# Patient Record
Sex: Female | Born: 1970 | Race: White | Hispanic: No
Health system: Southern US, Academic
[De-identification: ages and names within clinical notes are randomized; demographics above are authoritative.]

## PROBLEM LIST (undated history)

## (undated) ENCOUNTER — Encounter

## (undated) ENCOUNTER — Ambulatory Visit: Payer: BLUE CROSS/BLUE SHIELD | Attending: Obesity Medicine | Primary: Obesity Medicine

## (undated) ENCOUNTER — Encounter
Attending: Student in an Organized Health Care Education/Training Program | Primary: Student in an Organized Health Care Education/Training Program

## (undated) ENCOUNTER — Encounter: Attending: Family | Primary: Family

## (undated) ENCOUNTER — Ambulatory Visit: Payer: BLUE CROSS/BLUE SHIELD

## (undated) ENCOUNTER — Ambulatory Visit: Payer: BLUE CROSS/BLUE SHIELD | Attending: Ambulatory Care | Primary: Ambulatory Care

## (undated) ENCOUNTER — Ambulatory Visit
Payer: BLUE CROSS/BLUE SHIELD | Attending: Physical Medicine & Rehabilitation | Primary: Physical Medicine & Rehabilitation

## (undated) ENCOUNTER — Ambulatory Visit

## (undated) ENCOUNTER — Ambulatory Visit: Payer: Medicaid (Managed Care)

## (undated) ENCOUNTER — Telehealth

## (undated) ENCOUNTER — Ambulatory Visit
Payer: Medicaid (Managed Care) | Attending: Physical Medicine & Rehabilitation | Primary: Physical Medicine & Rehabilitation

## (undated) ENCOUNTER — Ambulatory Visit
Payer: BLUE CROSS/BLUE SHIELD | Attending: Student in an Organized Health Care Education/Training Program | Primary: Student in an Organized Health Care Education/Training Program

## (undated) ENCOUNTER — Ambulatory Visit: Payer: Medicaid (Managed Care) | Attending: Otolaryngology | Primary: Otolaryngology

## (undated) ENCOUNTER — Encounter: Attending: Obesity Medicine | Primary: Obesity Medicine

## (undated) ENCOUNTER — Ambulatory Visit: Payer: BLUE CROSS/BLUE SHIELD | Attending: Physician Assistant | Primary: Physician Assistant

## (undated) ENCOUNTER — Ambulatory Visit: Payer: BLUE CROSS/BLUE SHIELD | Attending: Family | Primary: Family

## (undated) ENCOUNTER — Ambulatory Visit
Payer: Medicaid (Managed Care) | Attending: Student in an Organized Health Care Education/Training Program | Primary: Student in an Organized Health Care Education/Training Program

## (undated) ENCOUNTER — Telehealth: Attending: Nephrology | Primary: Nephrology

## (undated) ENCOUNTER — Ambulatory Visit: Payer: Medicaid (Managed Care) | Attending: Family | Primary: Family

## (undated) ENCOUNTER — Encounter: Attending: Family Medicine | Primary: Family Medicine

## (undated) ENCOUNTER — Encounter: Payer: BLUE CROSS/BLUE SHIELD | Attending: Family | Primary: Family

## (undated) ENCOUNTER — Encounter: Attending: Physician Assistant | Primary: Physician Assistant

## (undated) ENCOUNTER — Ambulatory Visit: Attending: Family | Primary: Family

## (undated) ENCOUNTER — Encounter: Attending: Physical Medicine & Rehabilitation | Primary: Physical Medicine & Rehabilitation

## (undated) ENCOUNTER — Encounter: Payer: BLUE CROSS/BLUE SHIELD | Attending: Nurse Practitioner | Primary: Nurse Practitioner

## (undated) ENCOUNTER — Telehealth: Attending: Family | Primary: Family

## (undated) ENCOUNTER — Encounter: Payer: BLUE CROSS/BLUE SHIELD | Attending: Obesity Medicine | Primary: Obesity Medicine

## (undated) ENCOUNTER — Telehealth: Attending: Foot and Ankle Surgery | Primary: Foot and Ankle Surgery

## (undated) ENCOUNTER — Encounter: Payer: BLUE CROSS/BLUE SHIELD | Attending: Foot and Ankle Surgery | Primary: Foot and Ankle Surgery

## (undated) ENCOUNTER — Encounter: Attending: Registered" | Primary: Registered"

## (undated) ENCOUNTER — Encounter: Attending: Ambulatory Care | Primary: Ambulatory Care

## (undated) ENCOUNTER — Encounter: Attending: Foot and Ankle Surgery | Primary: Foot and Ankle Surgery

## (undated) ENCOUNTER — Ambulatory Visit: Payer: BLUE CROSS/BLUE SHIELD | Attending: Registered" | Primary: Registered"

## (undated) ENCOUNTER — Ambulatory Visit: Payer: BLUE CROSS/BLUE SHIELD | Attending: Audiologist | Primary: Audiologist

## (undated) ENCOUNTER — Ambulatory Visit: Payer: BLUE CROSS/BLUE SHIELD | Attending: Surgery | Primary: Surgery

## (undated) ENCOUNTER — Ambulatory Visit: Payer: MEDICAID

## (undated) ENCOUNTER — Ambulatory Visit: Attending: Foot and Ankle Surgery | Primary: Foot and Ankle Surgery

## (undated) ENCOUNTER — Encounter: Payer: BLUE CROSS/BLUE SHIELD | Attending: Surgery | Primary: Surgery

## (undated) ENCOUNTER — Ambulatory Visit: Attending: Obesity Medicine | Primary: Obesity Medicine

## (undated) ENCOUNTER — Ambulatory Visit: Payer: BLUE CROSS/BLUE SHIELD | Attending: Foot and Ankle Surgery | Primary: Foot and Ankle Surgery

## (undated) ENCOUNTER — Telehealth: Payer: BLUE CROSS/BLUE SHIELD

## (undated) ENCOUNTER — Telehealth: Attending: Physical Medicine & Rehabilitation | Primary: Physical Medicine & Rehabilitation

## (undated) DIAGNOSIS — I10 Essential (primary) hypertension: Secondary | ICD-10-CM

## (undated) DIAGNOSIS — M549 Dorsalgia, unspecified: Secondary | ICD-10-CM

## (undated) DIAGNOSIS — E785 Hyperlipidemia, unspecified: Secondary | ICD-10-CM

## (undated) DIAGNOSIS — J45909 Unspecified asthma, uncomplicated: Secondary | ICD-10-CM

## (undated) DIAGNOSIS — E039 Hypothyroidism, unspecified: Secondary | ICD-10-CM

## (undated) DIAGNOSIS — Z87442 Personal history of urinary calculi: Secondary | ICD-10-CM

## (undated) DIAGNOSIS — R7303 Prediabetes: Secondary | ICD-10-CM

## (undated) HISTORY — PX: BACK SURGERY: SHX140

---
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
## 2004-01-21 ENCOUNTER — Ambulatory Visit: Payer: Self-pay | Admitting: Pain Medicine

## 2004-02-11 ENCOUNTER — Ambulatory Visit: Payer: Self-pay | Admitting: Pain Medicine

## 2004-03-19 ENCOUNTER — Ambulatory Visit: Payer: Self-pay | Admitting: Pain Medicine

## 2004-04-16 ENCOUNTER — Ambulatory Visit: Payer: Self-pay | Admitting: Pain Medicine

## 2004-05-12 ENCOUNTER — Ambulatory Visit: Payer: Self-pay | Admitting: Pain Medicine

## 2004-06-09 ENCOUNTER — Ambulatory Visit: Payer: Self-pay | Admitting: Pain Medicine

## 2004-07-07 ENCOUNTER — Ambulatory Visit: Payer: Self-pay | Admitting: Pain Medicine

## 2004-08-06 ENCOUNTER — Ambulatory Visit: Payer: Self-pay | Admitting: Pain Medicine

## 2004-09-03 ENCOUNTER — Ambulatory Visit: Payer: Self-pay | Admitting: Pain Medicine

## 2004-09-03 ENCOUNTER — Emergency Department: Payer: Self-pay | Admitting: Emergency Medicine

## 2004-09-03 ENCOUNTER — Other Ambulatory Visit: Payer: Self-pay

## 2004-10-06 ENCOUNTER — Ambulatory Visit: Payer: Self-pay | Admitting: Pain Medicine

## 2004-10-16 ENCOUNTER — Emergency Department: Payer: Self-pay | Admitting: Emergency Medicine

## 2004-11-03 ENCOUNTER — Ambulatory Visit: Payer: Self-pay | Admitting: Pain Medicine

## 2004-12-02 ENCOUNTER — Ambulatory Visit: Payer: Self-pay | Admitting: Pain Medicine

## 2005-10-30 ENCOUNTER — Emergency Department: Payer: Self-pay | Admitting: Emergency Medicine

## 2005-11-08 ENCOUNTER — Emergency Department (HOSPITAL_COMMUNITY): Admission: EM | Admit: 2005-11-08 | Discharge: 2005-11-08 | Payer: Self-pay | Admitting: Emergency Medicine

## 2007-02-28 ENCOUNTER — Encounter: Admission: RE | Admit: 2007-02-28 | Discharge: 2007-02-28 | Payer: Self-pay | Admitting: Orthopaedic Surgery

## 2008-02-09 ENCOUNTER — Encounter: Admission: RE | Admit: 2008-02-09 | Discharge: 2008-02-09 | Payer: Self-pay | Admitting: Orthopedic Surgery

## 2009-07-04 ENCOUNTER — Emergency Department: Payer: Self-pay | Admitting: Unknown Physician Specialty

## 2012-03-07 ENCOUNTER — Emergency Department: Payer: Self-pay | Admitting: Emergency Medicine

## 2012-03-07 LAB — CBC
MCH: 30.9 pg (ref 26.0–34.0)
Platelet: 328 10*3/uL (ref 150–440)
RBC: 4.88 10*6/uL (ref 3.80–5.20)
WBC: 10.8 10*3/uL (ref 3.6–11.0)

## 2012-03-07 LAB — CK TOTAL AND CKMB (NOT AT ARMC): CK, Total: 67 U/L (ref 21–215)

## 2012-03-07 LAB — COMPREHENSIVE METABOLIC PANEL
Albumin: 3.9 g/dL (ref 3.4–5.0)
Alkaline Phosphatase: 71 U/L (ref 50–136)
BUN: 12 mg/dL (ref 7–18)
Bilirubin,Total: 0.9 mg/dL (ref 0.2–1.0)
Creatinine: 0.7 mg/dL (ref 0.60–1.30)
Osmolality: 272 (ref 275–301)
SGPT (ALT): 29 U/L (ref 12–78)
Sodium: 136 mmol/L (ref 136–145)
Total Protein: 7.5 g/dL (ref 6.4–8.2)

## 2012-03-07 LAB — TSH: Thyroid Stimulating Horm: 6.23 u[IU]/mL — ABNORMAL HIGH

## 2012-06-28 ENCOUNTER — Ambulatory Visit: Payer: Self-pay | Admitting: Internal Medicine

## 2013-09-14 DIAGNOSIS — M549 Dorsalgia, unspecified: Secondary | ICD-10-CM | POA: Insufficient documentation

## 2013-11-13 DIAGNOSIS — R946 Abnormal results of thyroid function studies: Secondary | ICD-10-CM | POA: Insufficient documentation

## 2014-01-29 ENCOUNTER — Other Ambulatory Visit: Payer: Self-pay | Admitting: Orthopaedic Surgery

## 2014-01-29 DIAGNOSIS — M549 Dorsalgia, unspecified: Principal | ICD-10-CM

## 2014-01-29 DIAGNOSIS — G8929 Other chronic pain: Secondary | ICD-10-CM

## 2014-02-01 ENCOUNTER — Other Ambulatory Visit: Payer: Self-pay

## 2014-02-06 ENCOUNTER — Ambulatory Visit
Admission: RE | Admit: 2014-02-06 | Discharge: 2014-02-06 | Disposition: A | Discharge status: Home / Self Care | Payer: Medicaid Other | Source: Ambulatory Visit | Attending: Orthopaedic Surgery | Admitting: Orthopaedic Surgery

## 2014-02-06 DIAGNOSIS — G8929 Other chronic pain: Secondary | ICD-10-CM

## 2014-02-06 DIAGNOSIS — M549 Dorsalgia, unspecified: Principal | ICD-10-CM

## 2014-03-22 DIAGNOSIS — I2699 Other pulmonary embolism without acute cor pulmonale: Secondary | ICD-10-CM

## 2014-03-22 HISTORY — DX: Other pulmonary embolism without acute cor pulmonale: I26.99

## 2014-03-22 HISTORY — PX: ABDOMINAL HYSTERECTOMY: SHX81

## 2014-04-04 DIAGNOSIS — E039 Hypothyroidism, unspecified: Secondary | ICD-10-CM | POA: Insufficient documentation

## 2014-05-28 DIAGNOSIS — M1990 Unspecified osteoarthritis, unspecified site: Secondary | ICD-10-CM | POA: Insufficient documentation

## 2014-05-28 DIAGNOSIS — T7840XA Allergy, unspecified, initial encounter: Secondary | ICD-10-CM | POA: Insufficient documentation

## 2014-05-28 DIAGNOSIS — J4520 Mild intermittent asthma, uncomplicated: Secondary | ICD-10-CM | POA: Insufficient documentation

## 2017-01-10 ENCOUNTER — Ambulatory Visit
Admission: RE | Admit: 2017-01-10 | Discharge: 2017-01-10 | Disposition: A | Discharge status: Home / Self Care | Payer: MEDICAID

## 2017-01-10 DIAGNOSIS — Z1231 Encounter for screening mammogram for malignant neoplasm of breast: Principal | ICD-10-CM

## 2017-04-16 ENCOUNTER — Other Ambulatory Visit: Payer: Self-pay | Admitting: Oncology

## 2017-04-16 DIAGNOSIS — M961 Postlaminectomy syndrome, not elsewhere classified: Secondary | ICD-10-CM

## 2017-04-30 ENCOUNTER — Other Ambulatory Visit: Payer: Self-pay

## 2017-05-17 ENCOUNTER — Ambulatory Visit
Admission: RE | Admit: 2017-05-17 | Discharge: 2017-05-17 | Disposition: A | Discharge status: Home / Self Care | Payer: Medicaid Other | Source: Ambulatory Visit | Attending: Oncology | Admitting: Oncology

## 2017-05-17 DIAGNOSIS — M961 Postlaminectomy syndrome, not elsewhere classified: Secondary | ICD-10-CM

## 2017-05-17 MED ORDER — GADOBENATE DIMEGLUMINE 529 MG/ML IV SOLN
20.0000 mL | Freq: Once | INTRAVENOUS | Status: AC | PRN
  Administered 2017-05-17: 20 mL via INTRAVENOUS

## 2017-05-18 ENCOUNTER — Other Ambulatory Visit: Payer: Medicaid Other

## 2017-05-29 ENCOUNTER — Encounter
Admit: 2017-05-29 | Discharge: 2017-05-29 | Disposition: A | Discharge status: Home / Self Care | Payer: BLUE CROSS/BLUE SHIELD | Attending: Emergency Medicine

## 2017-05-29 DIAGNOSIS — M25511 Pain in right shoulder: Principal | ICD-10-CM

## 2017-05-29 MED ORDER — KETOROLAC 10 MG TABLET
ORAL_TABLET | Freq: Four times a day (QID) | ORAL | 0 refills | 0 days | Status: CP | PRN
Start: 2017-05-29 — End: 2017-06-01

## 2017-06-29 ENCOUNTER — Encounter: Admit: 2017-06-29 | Discharge: 2017-06-30 | Payer: BLUE CROSS/BLUE SHIELD

## 2017-06-29 DIAGNOSIS — M25562 Pain in left knee: Principal | ICD-10-CM

## 2017-11-07 ENCOUNTER — Emergency Department: Payer: Self-pay

## 2017-11-07 ENCOUNTER — Other Ambulatory Visit: Payer: Self-pay

## 2017-11-07 ENCOUNTER — Encounter: Payer: Self-pay | Admitting: *Deleted

## 2017-11-07 ENCOUNTER — Emergency Department
Admission: EM | Admit: 2017-11-07 | Discharge: 2017-11-07 | Disposition: A | Discharge status: Home / Self Care | Payer: Self-pay | Attending: Emergency Medicine | Admitting: Emergency Medicine

## 2017-11-07 DIAGNOSIS — Y9389 Activity, other specified: Secondary | ICD-10-CM | POA: Insufficient documentation

## 2017-11-07 DIAGNOSIS — Y998 Other external cause status: Secondary | ICD-10-CM | POA: Insufficient documentation

## 2017-11-07 DIAGNOSIS — W010XXA Fall on same level from slipping, tripping and stumbling without subsequent striking against object, initial encounter: Secondary | ICD-10-CM | POA: Insufficient documentation

## 2017-11-07 DIAGNOSIS — S9304XA Dislocation of right ankle joint, initial encounter: Secondary | ICD-10-CM | POA: Insufficient documentation

## 2017-11-07 DIAGNOSIS — Y92019 Unspecified place in single-family (private) house as the place of occurrence of the external cause: Secondary | ICD-10-CM | POA: Insufficient documentation

## 2017-11-07 DIAGNOSIS — S9306XA Dislocation of unspecified ankle joint, initial encounter: Secondary | ICD-10-CM

## 2017-11-07 HISTORY — DX: Dislocation of unspecified ankle joint, initial encounter: S93.06XA

## 2017-11-07 HISTORY — DX: Dorsalgia, unspecified: M54.9

## 2017-11-07 MED ORDER — BUPIVACAINE HCL 0.5 % IJ SOLN
10.0000 mL | Freq: Once | INTRAMUSCULAR | Status: AC
  Administered 2017-11-07: 10 mL
  Filled 2017-11-07 (×2): qty 10

## 2017-11-07 MED ORDER — LIDOCAINE HCL (PF) 1 % IJ SOLN
10.0000 mL | Freq: Once | INTRAMUSCULAR | Status: AC
Start: 2017-11-07 — End: 2017-11-07
  Administered 2017-11-07: 10 mL
  Filled 2017-11-07: qty 10

## 2017-11-07 MED ORDER — HYDROMORPHONE HCL 1 MG/ML IJ SOLN
1.0000 mg | Freq: Once | INTRAMUSCULAR | Status: AC
  Administered 2017-11-07: 1 mg via INTRAVENOUS
  Filled 2017-11-07: qty 1

## 2017-11-07 MED ORDER — ONDANSETRON 8 MG PO TBDP
ORAL_TABLET | ORAL | Status: AC
  Filled 2017-11-07: qty 1

## 2017-11-07 MED ORDER — OXYCODONE-ACETAMINOPHEN 5-325 MG PO TABS: 1.0000 | ORAL_TABLET | Freq: Four times a day (QID) | ORAL | 0 refills | Status: DC | PRN

## 2017-11-07 MED ORDER — ONDANSETRON HCL 4 MG/2ML IJ SOLN
4.0000 mg | Freq: Once | INTRAMUSCULAR | Status: AC
  Administered 2017-11-07: 4 mg via INTRAVENOUS
  Filled 2017-11-07: qty 2

## 2017-11-07 MED ORDER — SODIUM CHLORIDE 0.9 % IV BOLUS
1000.0000 mL | Freq: Once | INTRAVENOUS | Status: AC
  Administered 2017-11-07: 1000 mL via INTRAVENOUS

## 2017-11-07 MED ORDER — BUPIVACAINE HCL 0.5 % IJ SOLN: 50.0000 mL | Freq: Once | INTRAMUSCULAR | Status: DC

## 2017-11-07 MED ORDER — ONDANSETRON 8 MG PO TBDP
8.0000 mg | ORAL_TABLET | Freq: Once | ORAL | Status: AC
  Administered 2017-11-07: 8 mg via ORAL
  Filled 2017-11-07: qty 1

## 2017-11-07 NOTE — ED Notes (Signed)
Patient assisted with bedpan.

## 2017-11-07 NOTE — ED Provider Notes (Signed)
[facility] Emergency Department Provider Note  ____________________________________________  Time seen: Approximately 7:17 PM  I have reviewed the triage vital signs and the nursing notes.   HISTORY  Chief Complaint Ankle Injury    HPI Katie Ryan is a 47 y.o. female who presents the emergency department via EMS for right ankle injury.  Patient was attempting to clean her house using bed she is when she slipped on a pro-water.  Patient reports that her ankle twisted and was visibly deformed after injury.  Patient reports that she drug herself to her phone to call 911.  She is unable to move or bear weight to the right extremity.  EMS reported deformity on scene.  They splinted in same and marked pulses using indelible marker.  Sensation intact reportedly from EMS.  IV established with 100 mcg of fentanyl administered in route.  Visualization of the ankle does reveal mild deformity to the medial ankle.  Otherwise, no gross deformity.  Significant edema and mild ecchymosis is also appreciated.   Patient denies any other injuries or complaints at this time.  No history of previous ankle injury or surgery.   Past Medical History:  Diagnosis Date  . Back pain     There are no active problems to display for this patient.   Past Surgical History:  Procedure Laterality Date  . BACK SURGERY      Prior to Admission medications   Medication Sig Start Date End Date Taking? Authorizing Provider  oxyCODONE-acetaminophen (PERCOCET/ROXICET) 5-325 MG tablet Take 1 tablet by mouth every 6 (six) hours as needed for severe pain. 11/07/17   Cuthriell, Charline Bills, PA-C    Allergies Iohexol; Shellfish allergy; Sulfa antibiotics; and Topamax [topiramate]  History reviewed. No pertinent family history.  Social History Social History   Tobacco Use  . Smoking status: Never Smoker  . Smokeless tobacco: Never Used  Substance Use Topics  . Alcohol use: Never     Frequency: Never  . Drug use: Never     Review of Systems  Constitutional: No fever/chills Eyes: No visual changes. No discharge ENT: No upper respiratory complaints. Cardiovascular: no chest pain. Respiratory: no cough. No SOB. Gastrointestinal: No abdominal pain.  No nausea, no vomiting.   Musculoskeletal: Positive for right ankle injury Skin: Negative for rash, abrasions, lacerations, ecchymosis. Neurological: Negative for headaches, focal weakness or numbness. 10-point ROS otherwise negative.  ____________________________________________   PHYSICAL EXAM:  VITAL SIGNS: ED Triage Vitals [11/07/17 1916]  Enc Vitals Group     BP (!) 170/85     Pulse Rate 78     Resp 18     Temp 98.3 F (36.8 C)     Temp Source Oral     SpO2 97 %     Weight      Height      Head Circumference      Peak Flow      Pain Score      Pain Loc      Pain Edu?      Excl. in [place]?      Constitutional: Alert and oriented. Well appearing and in no acute distress. Eyes: Conjunctivae are normal. PERRL. EOMI. Head: Atraumatic. Neck: No stridor.    Cardiovascular: Normal rate, regular rhythm. Normal S1 and S2.  Good peripheral circulation. Respiratory: Normal respiratory effort without tachypnea or retractions. Lungs CTAB. Good air entry to the bases with no decreased or absent breath sounds. Musculoskeletal: Full range of motion to all extremities.  Right ankle in splint provided by EMS.. Visualization of the ankle does reveal mild deformity to the medial ankle.  Otherwise, no gross deformity.  Significant edema and mild ecchymosis is also appreciated.   Indelible marker to foot over dorsalis pedis pulse which is still intact.  Sensation intact all 5 digits.  Patient is extremely tender to palpation along the ankle joint globally.  No significant tenderness to palpation over the tarsal bones. Neurologic:  Normal speech and language. No gross focal neurologic deficits are appreciated.  Skin:  Skin is  warm, dry and intact. No rash noted. Psychiatric: Mood and affect are normal. Speech and behavior are normal. Patient exhibits appropriate insight and judgement.   ____________________________________________   LABS (all labs ordered are listed, but only abnormal results are displayed)  Labs Reviewed - No data to display ____________________________________________  EKG   ____________________________________________  RADIOLOGY I personally viewed and evaluated these images as part of my medical decision making, as well as reviewing the written report by the radiologist.  Dg Ankle 2 Views Right  Result Date: 11/07/2017 CLINICAL DATA:  Post reduction and splinting EXAM: RIGHT ANKLE - 2 VIEW COMPARISON:  Earlier exam of 11/07/2017 FINDINGS: Fiberglass splint material obscures bone detail. Decreased lateral subluxation of the talus at ankle joint versus prior study. No definite fracture or dislocation. Osseous mineralization normal. IMPRESSION: Decreased lateral subluxation of talus at ankle joint versus earlier study. Electronically Signed   By: Lavonia Dana M.D.   On: 11/07/2017 21:08   Dg Ankle Complete Right  Result Date: 11/07/2017 CLINICAL DATA:  RIGHT ankle injury, ankle deformity and bruising. EXAM: RIGHT ANKLE - COMPLETE 3+ VIEW COMPARISON:  None. FINDINGS: Prominent medial dislocation of the distal RIGHT tibia relative to the underlying talus, with associated widening of the medial ankle mortise. No convincing fracture line or displaced fracture fragment identified within the distal tibia, distal fibula or talus. Visualized structures of the hindfoot and midfoot appear intact and normally aligned. Probable soft tissue swelling/edema medial to the RIGHT ankle. IMPRESSION: 1. Prominent medial displacement of the distal tibia relative to the underlying talus. Probable additional medial displacement of the distal fibula with slight overlapping of the lateral malleolus and lateral talar  dome. 2. No convincing fracture line or displaced fracture fragment identified. 3. Soft tissue swelling/edema Electronically Signed   By: Franki Cabot M.D.   On: 11/07/2017 19:36    ____________________________________________    PROCEDURES  Procedure(s) performed:    Reduction of dislocation Date/Time: 11/07/2017 9:16 PM Performed by: Darletta Moll, PA-C Authorized by: Darletta Moll, PA-C  Consent: Verbal consent obtained. Risks and benefits: risks, benefits and alternatives were discussed Consent given by: patient Required items: required blood products, implants, devices, and special equipment available Patient identity confirmed: verbally with patient Time out: Immediately prior to procedure a "time out" was called to verify the correct patient, procedure, equipment, support staff and site/side marked as required. Local anesthesia used: yes Anesthesia: nerve block  Anesthesia: Local anesthesia used: yes Local Anesthetic: bupivacaine 0.5% with epinephrine and lidocaine 1% without epinephrine Anesthetic total: 15 mL  Sedation: Patient sedated: no  Patient tolerance: Patient tolerated the procedure well with no immediate complications Comments: Manual manipulation of the distal tibia along with significant dorsiflexion is applied.  Palpation reveals improvement and dislocation.  Patient is splinted.  Archie Endo Block Date/Time: 11/07/2017 9:17 PM Performed by: Darletta Moll, PA-C Authorized by: Darletta Moll, PA-C   Consent:    Consent obtained:  Verbal  Consent given by:  Patient   Risks discussed:  Swelling, unsuccessful block and pain Indications:    Indications:  Pain relief and procedural anesthesia Location:    Body area:  Lower extremity   Lower extremity nerve:  Saphenous (and sural)   Laterality:  Right Pre-procedure details:    Skin preparation:  Alcohol   Preparation: Patient was prepped and draped in usual sterile fashion    Procedure details (see MAR for exact dosages):    Block needle gauge:  25 G   Anesthetic injected:  Bupivacaine 0.5% w/o epi and lidocaine 1% w/o epi   Additive injected:  None   Injection procedure:  Anatomic landmarks identified, anatomic landmarks palpated, introduced needle, incremental injection and negative aspiration for blood Post-procedure details:    Dressing:  None   Outcome:  Anesthesia achieved   Patient tolerance of procedure:  Tolerated well, no immediate complications .Splint Application Date/Time: 09/27/6281 9:19 PM Performed by: Darletta Moll, PA-C Authorized by: Darletta Moll, PA-C   Consent:    Consent obtained:  Verbal   Consent given by:  Patient   Risks discussed:  Pain and swelling Pre-procedure details:    Sensation:  Normal Procedure details:    Laterality:  Right   Location:  Ankle   Ankle:  R ankle   Splint type:  Short leg and ankle stirrup   Supplies:  Cotton padding, Ortho-Glass and elastic bandage Post-procedure details:    Pain:  Unchanged   Sensation:  Normal   Patient tolerance of procedure:  Tolerated well, no immediate complications      Medications  bupivacaine (MARCAINE) 0.5 % (with pres) injection 10 mL (has no administration in time range)  sodium chloride 0.9 % bolus 1,000 mL (1,000 mLs Intravenous New Bag/Given 11/07/17 1936)  HYDROmorphone (DILAUDID) injection 1 mg (1 mg Intravenous Given 11/07/17 1936)  ondansetron (ZOFRAN) injection 4 mg (4 mg Intravenous Given 11/07/17 1936)  lidocaine (PF) (XYLOCAINE) 1 % injection 10 mL (10 mLs Infiltration Given 11/07/17 2008)  HYDROmorphone (DILAUDID) injection 1 mg (1 mg Intravenous Given 11/07/17 2103)     ____________________________________________   INITIAL IMPRESSION / ASSESSMENT AND PLAN / ED COURSE  Pertinent labs & imaging results that were available during my care of the patient were reviewed by me and considered in my medical decision making (see chart for  details).  Review of the [place] CSRS was performed in accordance of the [place] prior to dispensing any controlled drugs.      Patient's diagnosis is consistent with ankle dislocation.  Patient presents the emergency department complaining of right ankle injury and pain.  Patient slipped at her home causing deformity to her ankle.  On exam, exam is concerning for dislocation versus fracture.  X-ray reveals dislocation with no visible fracture.  Reduction and splinting were performed in the emergency department.  Patient was neurovascularly intact both status post and prior to procedure.  Patient is to ambulate with crutches.  Follow-up with orthopedics for further management.. Patient will be discharged home with prescriptions for Percocet. Patient is to follow up with orthopedics as needed or otherwise directed. Patient is given ED precautions to return to the ED for any worsening or new symptoms.     ____________________________________________  FINAL CLINICAL IMPRESSION(S) / ED DIAGNOSES  Final diagnoses:  Closed dislocation of ankle, right, initial encounter      NEW MEDICATIONS STARTED DURING THIS VISIT:  ED Discharge Orders         Ordered  oxyCODONE-acetaminophen (PERCOCET/ROXICET) 5-325 MG tablet  Every 6 hours PRN     11/07/17 2115              This chart was dictated using voice recognition software/Dragon. Despite best efforts to proofread, errors can occur which can change the meaning. Any change was purely unintentional.    Darletta Moll, PA-C 11/07/17 2121    Nance Pear, MD 11/07/17 2306

## 2017-11-07 NOTE — ED Triage Notes (Signed)
Fentanyl administered en route per EMS 200 mcg total last given 1857. Pt slipped on wet floor, injuring R ankle. Ankle is deformed and bruised. Strong smell of marijuana from pt.

## 2017-11-09 ENCOUNTER — Ambulatory Visit: Admit: 2017-11-09 | Discharge: 2017-11-10 | Payer: BLUE CROSS/BLUE SHIELD

## 2017-11-09 ENCOUNTER — Encounter: Admit: 2017-11-09 | Discharge: 2017-11-10 | Payer: BLUE CROSS/BLUE SHIELD

## 2017-11-09 DIAGNOSIS — S82451A Displaced comminuted fracture of shaft of right fibula, initial encounter for closed fracture: Principal | ICD-10-CM

## 2017-11-09 DIAGNOSIS — S82861A Displaced Maisonneuve's fracture of right leg, initial encounter for closed fracture: Principal | ICD-10-CM

## 2017-11-09 DIAGNOSIS — S82899A Other fracture of unspecified lower leg, initial encounter for closed fracture: Principal | ICD-10-CM

## 2017-11-09 DIAGNOSIS — S8991XA Unspecified injury of right lower leg, initial encounter: Principal | ICD-10-CM

## 2017-11-09 MED ORDER — OXYCODONE-ACETAMINOPHEN 5 MG-325 MG TABLET
ORAL_TABLET | ORAL | 0 refills | 0.00000 days | Status: CP | PRN
Start: 2017-11-09 — End: 2017-11-11

## 2017-11-10 ENCOUNTER — Encounter: Admit: 2017-11-10 | Discharge: 2017-11-11 | Payer: BLUE CROSS/BLUE SHIELD

## 2017-11-10 ENCOUNTER — Encounter: Admit: 2017-11-10 | Discharge: 2017-11-11

## 2017-11-10 ENCOUNTER — Ambulatory Visit: Admit: 2017-11-10 | Discharge: 2017-11-11

## 2017-11-10 DIAGNOSIS — S82451A Displaced comminuted fracture of shaft of right fibula, initial encounter for closed fracture: Principal | ICD-10-CM

## 2017-11-10 MED ORDER — DOCUSATE SODIUM 100 MG CAPSULE
ORAL_CAPSULE | Freq: Two times a day (BID) | ORAL | 0 refills | 0.00000 days | Status: CP
Start: 2017-11-10 — End: 2017-11-24

## 2017-11-10 MED ORDER — OXYCODONE 5 MG TABLET
ORAL_TABLET | ORAL | 0 refills | 0 days | Status: CP | PRN
Start: 2017-11-10 — End: 2017-11-15

## 2017-11-10 MED ORDER — ASCORBIC ACID (VITAMIN C) 1,000 MG TABLET
ORAL_TABLET | Freq: Every day | ORAL | 0 refills | 0.00000 days | Status: CP
Start: 2017-11-10 — End: 2017-12-22

## 2017-11-10 MED ORDER — ENOXAPARIN 40 MG/0.4 ML SUBCUTANEOUS SYRINGE
INJECTION | Freq: Two times a day (BID) | SUBCUTANEOUS | 0 refills | 0.00000 days | Status: CP
Start: 2017-11-10 — End: 2017-11-24

## 2017-11-10 MED ORDER — CALCIUM CARBONATE 200 MG CALCIUM (500 MG) CHEWABLE TABLET
ORAL_TABLET | Freq: Every day | ORAL | 0 refills | 0.00000 days | Status: CP
Start: 2017-11-10 — End: 2017-12-10

## 2017-11-10 MED ORDER — GABAPENTIN 100 MG CAPSULE
ORAL_CAPSULE | Freq: Three times a day (TID) | ORAL | 0 refills | 0 days | Status: CP
Start: 2017-11-10 — End: 2018-08-11

## 2017-11-10 MED ORDER — ACETAMINOPHEN 500 MG TABLET
ORAL_TABLET | Freq: Three times a day (TID) | ORAL | 0 refills | 0.00000 days | Status: CP
Start: 2017-11-10 — End: 2017-11-24

## 2017-11-10 MED ORDER — PROMETHAZINE 12.5 MG TABLET
ORAL_TABLET | Freq: Four times a day (QID) | ORAL | 0 refills | 0.00000 days | Status: CP | PRN
Start: 2017-11-10 — End: 2018-08-23

## 2017-11-22 ENCOUNTER — Encounter
Admit: 2017-11-22 | Discharge: 2017-11-23 | Payer: BLUE CROSS/BLUE SHIELD | Attending: Foot and Ankle Surgery | Primary: Foot and Ankle Surgery

## 2017-11-22 DIAGNOSIS — S82861S Displaced Maisonneuve's fracture of right leg, sequela: Principal | ICD-10-CM

## 2017-12-13 ENCOUNTER — Encounter: Admit: 2017-12-13 | Discharge: 2017-12-14 | Payer: BLUE CROSS/BLUE SHIELD

## 2017-12-13 ENCOUNTER — Encounter
Admit: 2017-12-13 | Discharge: 2017-12-14 | Payer: BLUE CROSS/BLUE SHIELD | Attending: Foot and Ankle Surgery | Primary: Foot and Ankle Surgery

## 2017-12-13 DIAGNOSIS — S82861S Displaced Maisonneuve's fracture of right leg, sequela: Principal | ICD-10-CM

## 2018-01-05 ENCOUNTER — Encounter
Admit: 2018-01-05 | Discharge: 2018-01-06 | Disposition: A | Discharge status: Home / Self Care | Payer: BLUE CROSS/BLUE SHIELD | Attending: Family

## 2018-01-05 MED ORDER — METRONIDAZOLE 500 MG TABLET
ORAL_TABLET | Freq: Two times a day (BID) | ORAL | 0 refills | 0 days | Status: CP
Start: 2018-01-05 — End: 2018-01-12

## 2018-01-05 MED ORDER — FLUCONAZOLE 150 MG TABLET
ORAL_TABLET | Freq: Once | ORAL | 0 refills | 0 days | Status: CP | PRN
Start: 2018-01-05 — End: 2018-08-23

## 2018-02-19 ENCOUNTER — Encounter
Admit: 2018-02-19 | Discharge: 2018-02-20 | Disposition: A | Discharge status: Home / Self Care | Payer: BLUE CROSS/BLUE SHIELD

## 2018-02-19 MED ORDER — MICONAZOLE NITRATE 2 % TOPICAL CREAM
Freq: Two times a day (BID) | TOPICAL | 0 refills | 0.00000 days | Status: CP
Start: 2018-02-19 — End: 2018-08-23

## 2018-08-01 ENCOUNTER — Telehealth
Admit: 2018-08-01 | Discharge: 2018-08-02 | Payer: BLUE CROSS/BLUE SHIELD | Attending: Foot and Ankle Surgery | Primary: Foot and Ankle Surgery

## 2018-08-01 DIAGNOSIS — S82861S Displaced Maisonneuve's fracture of right leg, sequela: Principal | ICD-10-CM

## 2018-08-02 ENCOUNTER — Ambulatory Visit: Admit: 2018-08-02 | Discharge: 2018-08-03

## 2018-08-02 DIAGNOSIS — S82861S Displaced Maisonneuve's fracture of right leg, sequela: Principal | ICD-10-CM

## 2018-08-11 ENCOUNTER — Ambulatory Visit: Admit: 2018-08-11 | Discharge: 2018-08-12 | Attending: Foot and Ankle Surgery | Primary: Foot and Ankle Surgery

## 2018-08-11 DIAGNOSIS — T8484XA Pain due to internal orthopedic prosthetic devices, implants and grafts, initial encounter: Principal | ICD-10-CM

## 2018-08-23 ENCOUNTER — Encounter: Admit: 2018-08-23 | Discharge: 2018-08-24 | Payer: BLUE CROSS/BLUE SHIELD | Attending: Family | Primary: Family

## 2018-08-23 ENCOUNTER — Telehealth: Admit: 2018-08-23 | Discharge: 2018-08-24

## 2018-08-23 DIAGNOSIS — T8484XA Pain due to internal orthopedic prosthetic devices, implants and grafts, initial encounter: Secondary | ICD-10-CM

## 2018-08-23 DIAGNOSIS — M545 Low back pain: Secondary | ICD-10-CM

## 2018-08-23 DIAGNOSIS — J4520 Mild intermittent asthma, uncomplicated: Secondary | ICD-10-CM

## 2018-08-23 DIAGNOSIS — G8929 Other chronic pain: Secondary | ICD-10-CM

## 2018-08-23 DIAGNOSIS — Z01818 Encounter for other preprocedural examination: Principal | ICD-10-CM

## 2018-08-23 DIAGNOSIS — Z6841 Body Mass Index (BMI) 40.0 and over, adult: Secondary | ICD-10-CM

## 2018-08-23 DIAGNOSIS — K219 Gastro-esophageal reflux disease without esophagitis: Secondary | ICD-10-CM

## 2018-08-28 ENCOUNTER — Ambulatory Visit: Admit: 2018-08-28 | Discharge: 2018-08-29 | Attending: Family | Primary: Family

## 2018-08-28 DIAGNOSIS — Z1159 Encounter for screening for other viral diseases: Principal | ICD-10-CM

## 2018-08-29 DIAGNOSIS — T8484XA Pain due to internal orthopedic prosthetic devices, implants and grafts, initial encounter: Principal | ICD-10-CM

## 2018-08-30 ENCOUNTER — Ambulatory Visit: Admit: 2018-08-30 | Discharge: 2018-08-30

## 2018-08-30 ENCOUNTER — Encounter
Admit: 2018-08-30 | Discharge: 2018-08-30 | Attending: Student in an Organized Health Care Education/Training Program | Primary: Student in an Organized Health Care Education/Training Program

## 2018-08-30 DIAGNOSIS — T8484XA Pain due to internal orthopedic prosthetic devices, implants and grafts, initial encounter: Principal | ICD-10-CM

## 2018-08-30 MED ORDER — ACETAMINOPHEN 500 MG TABLET
ORAL_TABLET | Freq: Three times a day (TID) | ORAL | 0 refills | 0.00000 days | Status: CP | PRN
Start: 2018-08-30 — End: 2018-09-13

## 2018-08-30 MED ORDER — PROMETHAZINE 12.5 MG TABLET
ORAL_TABLET | Freq: Four times a day (QID) | ORAL | 0 refills | 0.00000 days | Status: CP | PRN
Start: 2018-08-30 — End: ?
  Filled 2018-08-30: qty 30, 4d supply, fill #0

## 2018-08-30 MED ORDER — MULTIVITAMIN TABLET
ORAL_TABLET | Freq: Every day | ORAL | 0 refills | 0.00000 days | Status: CP
Start: 2018-08-30 — End: 2018-09-20
  Filled 2018-11-27: qty 8, 56d supply, fill #0

## 2018-08-30 MED ORDER — GABAPENTIN 100 MG CAPSULE
ORAL_CAPSULE | Freq: Three times a day (TID) | ORAL | 0 refills | 0.00000 days | Status: CP
Start: 2018-08-30 — End: 2018-09-02
  Filled 2018-11-27: qty 9, 3d supply, fill #0

## 2018-08-30 MED ORDER — DOCUSATE SODIUM 100 MG CAPSULE
ORAL_CAPSULE | Freq: Two times a day (BID) | ORAL | 0 refills | 0 days | Status: CP | PRN
Start: 2018-08-30 — End: 2018-09-13
  Filled 2018-11-27: qty 28, 14d supply, fill #0

## 2018-08-30 MED ORDER — ERGOCALCIFEROL (VITAMIN D2) 1,250 MCG (50,000 UNIT) CAPSULE
ORAL_CAPSULE | ORAL | 0 refills | 0 days | Status: CP
Start: 2018-08-30 — End: 2018-10-25

## 2018-08-30 MED ORDER — OXYCODONE 5 MG TABLET
ORAL_TABLET | Freq: Four times a day (QID) | ORAL | 0 refills | 0.00000 days | Status: CP | PRN
Start: 2018-08-30 — End: 2018-09-04
  Filled 2018-08-30: qty 15, 3d supply, fill #0

## 2018-08-30 MED ORDER — ASPIRIN 81 MG CHEWABLE TABLET
ORAL_TABLET | Freq: Every day | ORAL | 0 refills | 0.00000 days | Status: CP
Start: 2018-08-30 — End: 2018-10-11
  Filled 2018-11-27: qty 36, 36d supply, fill #0

## 2018-08-30 MED ORDER — ASCORBIC ACID (VITAMIN C) 500 MG TABLET
ORAL_TABLET | Freq: Every day | ORAL | 0 refills | 0.00000 days | Status: CP
Start: 2018-08-30 — End: 2018-09-20

## 2018-08-30 MED FILL — OXYCODONE 5 MG TABLET: 3 days supply | Qty: 15 | Fill #0 | Status: AC

## 2018-08-30 MED FILL — PROMETHAZINE 12.5 MG TABLET: 4 days supply | Qty: 30 | Fill #0 | Status: AC

## 2018-09-08 ENCOUNTER — Ambulatory Visit: Admit: 2018-09-08 | Discharge: 2018-09-09 | Attending: Family | Primary: Family

## 2018-09-08 DIAGNOSIS — M1990 Unspecified osteoarthritis, unspecified site: Secondary | ICD-10-CM

## 2018-09-08 DIAGNOSIS — E7800 Pure hypercholesterolemia, unspecified: Secondary | ICD-10-CM

## 2018-09-08 DIAGNOSIS — G8929 Other chronic pain: Secondary | ICD-10-CM

## 2018-09-08 DIAGNOSIS — J4520 Mild intermittent asthma, uncomplicated: Secondary | ICD-10-CM

## 2018-09-08 DIAGNOSIS — M545 Low back pain: Secondary | ICD-10-CM

## 2018-09-08 DIAGNOSIS — E039 Hypothyroidism, unspecified: Principal | ICD-10-CM

## 2018-09-08 DIAGNOSIS — T8484XA Pain due to internal orthopedic prosthetic devices, implants and grafts, initial encounter: Secondary | ICD-10-CM

## 2018-09-08 DIAGNOSIS — E049 Nontoxic goiter, unspecified: Secondary | ICD-10-CM

## 2018-09-08 MED ORDER — ALBUTEROL SULFATE HFA 90 MCG/ACTUATION AEROSOL INHALER
Freq: Four times a day (QID) | RESPIRATORY_TRACT | 0 refills | 0 days | Status: CP | PRN
Start: 2018-09-08 — End: 2018-11-01
  Filled 2018-09-27: qty 8.5, 25d supply, fill #0

## 2018-09-08 MED ORDER — POTASSIUM CHLORIDE ER 10 MEQ TABLET,EXTENDED RELEASE
ORAL_TABLET | Freq: Every day | ORAL | 1 refills | 90.00000 days | Status: CP
Start: 2018-09-08 — End: ?
  Filled 2018-09-27: qty 30, 30d supply, fill #0

## 2018-09-08 MED ORDER — CELECOXIB 200 MG CAPSULE
ORAL_CAPSULE | Freq: Every day | ORAL | 3 refills | 0 days | Status: CP
Start: 2018-09-08 — End: 2018-09-20

## 2018-09-08 MED ORDER — HYDROCHLOROTHIAZIDE 25 MG TABLET
ORAL_TABLET | Freq: Every morning | ORAL | 3 refills | 30 days | Status: CP
Start: 2018-09-08 — End: ?
  Filled 2018-09-27: qty 30, 30d supply, fill #0

## 2018-09-08 MED ORDER — PANTOPRAZOLE 40 MG TABLET,DELAYED RELEASE
ORAL_TABLET | Freq: Every day | ORAL | 1 refills | 90 days | Status: CP
Start: 2018-09-08 — End: ?
  Filled 2018-09-27: qty 30, 30d supply, fill #0

## 2018-09-08 MED ORDER — VANIQA 13.9 % TOPICAL CREAM
0 refills | 0 days | Status: CP
Start: 2018-09-08 — End: ?

## 2018-09-11 ENCOUNTER — Ambulatory Visit: Admit: 2018-09-11 | Discharge: 2018-09-12

## 2018-09-11 DIAGNOSIS — T8484XA Pain due to internal orthopedic prosthetic devices, implants and grafts, initial encounter: Principal | ICD-10-CM

## 2018-09-11 MED ORDER — TRAMADOL 50 MG TABLET
ORAL_TABLET | Freq: Four times a day (QID) | ORAL | 0 refills | 0.00000 days | Status: CP | PRN
Start: 2018-09-11 — End: 2018-11-13
  Filled 2018-09-27: qty 20, 5d supply, fill #0

## 2018-09-20 ENCOUNTER — Institutional Professional Consult (permissible substitution): Admit: 2018-09-20 | Discharge: 2018-09-21 | Attending: Family | Primary: Family

## 2018-09-20 DIAGNOSIS — E7800 Pure hypercholesterolemia, unspecified: Secondary | ICD-10-CM

## 2018-09-20 DIAGNOSIS — M1990 Unspecified osteoarthritis, unspecified site: Secondary | ICD-10-CM

## 2018-09-20 DIAGNOSIS — R17 Unspecified jaundice: Secondary | ICD-10-CM

## 2018-09-20 DIAGNOSIS — E039 Hypothyroidism, unspecified: Principal | ICD-10-CM

## 2018-09-20 MED ORDER — CELECOXIB 200 MG CAPSULE
ORAL_CAPSULE | Freq: Every day | ORAL | 3 refills | 0 days
Start: 2018-09-20 — End: ?

## 2018-09-27 MED FILL — ALBUTEROL SULFATE HFA 90 MCG/ACTUATION AEROSOL INHALER: 25 days supply | Qty: 8 | Fill #0 | Status: AC

## 2018-09-27 MED FILL — PANTOPRAZOLE 40 MG TABLET,DELAYED RELEASE: 30 days supply | Qty: 30 | Fill #0 | Status: AC

## 2018-09-27 MED FILL — TRAMADOL 50 MG TABLET: 5 days supply | Qty: 20 | Fill #0 | Status: AC

## 2018-09-27 MED FILL — POTASSIUM CHLORIDE ER 10 MEQ TABLET,EXTENDED RELEASE: 30 days supply | Qty: 30 | Fill #0 | Status: AC

## 2018-09-27 MED FILL — HYDROCHLOROTHIAZIDE 25 MG TABLET: 30 days supply | Qty: 30 | Fill #0 | Status: AC

## 2018-10-02 ENCOUNTER — Institutional Professional Consult (permissible substitution): Admit: 2018-10-02 | Discharge: 2018-10-03 | Attending: Foot and Ankle Surgery | Primary: Foot and Ankle Surgery

## 2018-10-02 DIAGNOSIS — T8484XA Pain due to internal orthopedic prosthetic devices, implants and grafts, initial encounter: Principal | ICD-10-CM

## 2018-10-26 ENCOUNTER — Ambulatory Visit: Admit: 2018-10-26 | Discharge: 2018-11-24

## 2018-10-26 ENCOUNTER — Ambulatory Visit
Admit: 2018-10-26 | Discharge: 2018-11-24 | Attending: Rehabilitative and Restorative Service Providers" | Primary: Rehabilitative and Restorative Service Providers"

## 2018-10-26 DIAGNOSIS — R262 Difficulty in walking, not elsewhere classified: Secondary | ICD-10-CM

## 2018-10-26 DIAGNOSIS — M25571 Pain in right ankle and joints of right foot: Principal | ICD-10-CM

## 2018-10-26 DIAGNOSIS — G8929 Other chronic pain: Secondary | ICD-10-CM

## 2018-10-26 DIAGNOSIS — T8484XA Pain due to internal orthopedic prosthetic devices, implants and grafts, initial encounter: Secondary | ICD-10-CM

## 2018-11-01 ENCOUNTER — Other Ambulatory Visit: Admit: 2018-11-01 | Discharge: 2018-11-02

## 2018-11-01 DIAGNOSIS — R17 Unspecified jaundice: Principal | ICD-10-CM

## 2018-11-01 DIAGNOSIS — E039 Hypothyroidism, unspecified: Secondary | ICD-10-CM

## 2018-11-01 DIAGNOSIS — E7800 Pure hypercholesterolemia, unspecified: Secondary | ICD-10-CM

## 2018-11-03 MED ORDER — CELECOXIB 200 MG CAPSULE
ORAL_CAPSULE | Freq: Every day | ORAL | 1 refills | 30 days | Status: CP
Start: 2018-11-03 — End: 2019-11-03
  Filled 2018-11-06: qty 30, 30d supply, fill #0

## 2018-11-03 MED ORDER — ALBUTEROL SULFATE HFA 90 MCG/ACTUATION AEROSOL INHALER
Freq: Four times a day (QID) | RESPIRATORY_TRACT | 2 refills | 25 days | Status: CP | PRN
Start: 2018-11-03 — End: 2019-11-03
  Filled 2018-11-06: qty 8.5, 25d supply, fill #0

## 2018-11-06 MED FILL — PANTOPRAZOLE 40 MG TABLET,DELAYED RELEASE: 30 days supply | Qty: 30 | Fill #1 | Status: AC

## 2018-11-06 MED FILL — CELEBREX 200 MG CAPSULE: 30 days supply | Qty: 30 | Fill #0 | Status: AC

## 2018-11-06 MED FILL — PANTOPRAZOLE 40 MG TABLET,DELAYED RELEASE: ORAL | 30 days supply | Qty: 30 | Fill #1

## 2018-11-06 MED FILL — HYDROCHLOROTHIAZIDE 25 MG TABLET: 30 days supply | Qty: 30 | Fill #1 | Status: AC

## 2018-11-06 MED FILL — POTASSIUM CHLORIDE ER 10 MEQ TABLET,EXTENDED RELEASE: 30 days supply | Qty: 30 | Fill #1 | Status: AC

## 2018-11-06 MED FILL — POTASSIUM CHLORIDE ER 10 MEQ TABLET,EXTENDED RELEASE: ORAL | 30 days supply | Qty: 30 | Fill #1

## 2018-11-06 MED FILL — ALBUTEROL SULFATE HFA 90 MCG/ACTUATION AEROSOL INHALER: 25 days supply | Qty: 8 | Fill #0 | Status: AC

## 2018-11-06 MED FILL — HYDROCHLOROTHIAZIDE 25 MG TABLET: ORAL | 30 days supply | Qty: 30 | Fill #1

## 2018-11-09 MED ORDER — PROPRANOLOL 10 MG TABLET
ORAL_TABLET | Freq: Two times a day (BID) | ORAL | 1 refills | 30 days | Status: CP
Start: 2018-11-09 — End: 2019-11-09
  Filled 2018-11-27: qty 60, 30d supply, fill #0

## 2018-11-10 DIAGNOSIS — G8929 Other chronic pain: Secondary | ICD-10-CM

## 2018-11-10 DIAGNOSIS — M25571 Pain in right ankle and joints of right foot: Principal | ICD-10-CM

## 2018-11-10 DIAGNOSIS — R262 Difficulty in walking, not elsewhere classified: Secondary | ICD-10-CM

## 2018-11-13 ENCOUNTER — Institutional Professional Consult (permissible substitution): Admit: 2018-11-13 | Discharge: 2018-11-14 | Attending: Foot and Ankle Surgery | Primary: Foot and Ankle Surgery

## 2018-11-13 DIAGNOSIS — T8484XA Pain due to internal orthopedic prosthetic devices, implants and grafts, initial encounter: Principal | ICD-10-CM

## 2018-11-13 MED ORDER — TRAMADOL 50 MG TABLET
ORAL_TABLET | Freq: Three times a day (TID) | ORAL | 0 refills | 5 days | Status: CP | PRN
Start: 2018-11-13 — End: ?

## 2018-11-16 DIAGNOSIS — T8484XA Pain due to internal orthopedic prosthetic devices, implants and grafts, initial encounter: Secondary | ICD-10-CM

## 2018-11-16 DIAGNOSIS — R262 Difficulty in walking, not elsewhere classified: Secondary | ICD-10-CM

## 2018-11-16 DIAGNOSIS — M25571 Pain in right ankle and joints of right foot: Principal | ICD-10-CM

## 2018-11-16 DIAGNOSIS — G8929 Other chronic pain: Secondary | ICD-10-CM

## 2018-11-20 DIAGNOSIS — T8484XA Pain due to internal orthopedic prosthetic devices, implants and grafts, initial encounter: Secondary | ICD-10-CM

## 2018-11-20 DIAGNOSIS — R262 Difficulty in walking, not elsewhere classified: Secondary | ICD-10-CM

## 2018-11-20 DIAGNOSIS — G8929 Other chronic pain: Secondary | ICD-10-CM

## 2018-11-20 DIAGNOSIS — M25571 Pain in right ankle and joints of right foot: Principal | ICD-10-CM

## 2018-11-23 MED ORDER — TRAMADOL 50 MG TABLET
ORAL_TABLET | Freq: Three times a day (TID) | ORAL | 0 refills | 5.00000 days | Status: CP | PRN
Start: 2018-11-23 — End: ?
  Filled 2018-11-27: qty 84, 14d supply, fill #0

## 2018-11-27 MED FILL — ASPIRIN 81 MG CHEWABLE TABLET: 36 days supply | Qty: 36 | Fill #0 | Status: AC

## 2018-11-27 MED FILL — ALBUTEROL SULFATE HFA 90 MCG/ACTUATION AEROSOL INHALER: 25 days supply | Qty: 8 | Fill #0 | Status: AC

## 2018-11-27 MED FILL — ACETAMINOPHEN 500 MG TABLET: 14 days supply | Qty: 84 | Fill #0 | Status: AC

## 2018-11-27 MED FILL — POTASSIUM CHLORIDE ER 10 MEQ TABLET,EXTENDED RELEASE: ORAL | 90 days supply | Qty: 90 | Fill #0

## 2018-11-27 MED FILL — GABAPENTIN 100 MG CAPSULE: 3 days supply | Qty: 9 | Fill #0 | Status: AC

## 2018-11-27 MED FILL — ALBUTEROL SULFATE HFA 90 MCG/ACTUATION AEROSOL INHALER: RESPIRATORY_TRACT | 25 days supply | Qty: 8.5 | Fill #0

## 2018-11-27 MED FILL — PANTOPRAZOLE 40 MG TABLET,DELAYED RELEASE: 90 days supply | Qty: 90 | Fill #0 | Status: AC

## 2018-11-27 MED FILL — PROPRANOLOL 10 MG TABLET: 30 days supply | Qty: 60 | Fill #0 | Status: AC

## 2018-11-27 MED FILL — HYDROCHLOROTHIAZIDE 25 MG TABLET: 30 days supply | Qty: 30 | Fill #0 | Status: AC

## 2018-11-27 MED FILL — DOK 100 MG CAPSULE: 14 days supply | Qty: 28 | Fill #0 | Status: AC

## 2018-11-27 MED FILL — POTASSIUM CHLORIDE ER 10 MEQ TABLET,EXTENDED RELEASE: 90 days supply | Qty: 90 | Fill #0 | Status: AC

## 2018-11-27 MED FILL — ERGOCALCIFEROL (VITAMIN D2) 1,250 MCG (50,000 UNIT) CAPSULE: 56 days supply | Qty: 8 | Fill #0 | Status: AC

## 2018-11-27 MED FILL — HYDROCHLOROTHIAZIDE 25 MG TABLET: ORAL | 30 days supply | Qty: 30 | Fill #0

## 2018-11-27 MED FILL — PANTOPRAZOLE 40 MG TABLET,DELAYED RELEASE: ORAL | 90 days supply | Qty: 90 | Fill #0

## 2018-11-28 MED FILL — CELECOXIB 200 MG CAPSULE: ORAL | 30 days supply | Qty: 30 | Fill #0

## 2018-11-28 MED FILL — CELECOXIB 200 MG CAPSULE: 30 days supply | Qty: 30 | Fill #0 | Status: AC

## 2018-11-29 ENCOUNTER — Ambulatory Visit: Admit: 2018-11-29 | Discharge: 2018-11-30 | Attending: Family | Primary: Family

## 2018-11-29 DIAGNOSIS — E039 Hypothyroidism, unspecified: Secondary | ICD-10-CM

## 2018-11-29 DIAGNOSIS — R635 Abnormal weight gain: Secondary | ICD-10-CM

## 2018-11-29 DIAGNOSIS — E785 Hyperlipidemia, unspecified: Secondary | ICD-10-CM

## 2018-11-29 DIAGNOSIS — E049 Nontoxic goiter, unspecified: Secondary | ICD-10-CM

## 2018-11-29 MED ORDER — LEVOTHYROXINE 25 MCG TABLET
ORAL_TABLET | Freq: Every day | ORAL | 3 refills | 30 days | Status: CP
Start: 2018-11-29 — End: 2019-11-29
  Filled 2018-12-01: qty 30, 30d supply, fill #0

## 2018-12-01 MED FILL — TRAMADOL 50 MG TABLET: ORAL | 5 days supply | Qty: 14 | Fill #0

## 2018-12-01 MED FILL — SYNTHROID 25 MCG TABLET: 30 days supply | Qty: 30 | Fill #0 | Status: AC

## 2018-12-01 MED FILL — TRAMADOL 50 MG TABLET: 5 days supply | Qty: 14 | Fill #0 | Status: AC

## 2018-12-04 ENCOUNTER — Ambulatory Visit: Admit: 2018-12-04 | Discharge: 2018-12-24

## 2018-12-04 ENCOUNTER — Ambulatory Visit: Admit: 2018-12-04 | Discharge: 2018-12-05

## 2018-12-04 DIAGNOSIS — G8929 Other chronic pain: Secondary | ICD-10-CM

## 2018-12-04 DIAGNOSIS — M170 Bilateral primary osteoarthritis of knee: Secondary | ICD-10-CM

## 2018-12-04 DIAGNOSIS — R262 Difficulty in walking, not elsewhere classified: Secondary | ICD-10-CM

## 2018-12-04 DIAGNOSIS — T8484XA Pain due to internal orthopedic prosthetic devices, implants and grafts, initial encounter: Secondary | ICD-10-CM

## 2018-12-04 DIAGNOSIS — M25571 Pain in right ankle and joints of right foot: Principal | ICD-10-CM

## 2018-12-14 DIAGNOSIS — R262 Difficulty in walking, not elsewhere classified: Secondary | ICD-10-CM

## 2018-12-14 DIAGNOSIS — M25571 Pain in right ankle and joints of right foot: Principal | ICD-10-CM

## 2018-12-14 DIAGNOSIS — G8929 Other chronic pain: Secondary | ICD-10-CM

## 2018-12-14 DIAGNOSIS — T8484XA Pain due to internal orthopedic prosthetic devices, implants and grafts, initial encounter: Secondary | ICD-10-CM

## 2018-12-20 DIAGNOSIS — M25571 Pain in right ankle and joints of right foot: Principal | ICD-10-CM

## 2018-12-20 DIAGNOSIS — R262 Difficulty in walking, not elsewhere classified: Secondary | ICD-10-CM

## 2018-12-20 DIAGNOSIS — T8484XA Pain due to internal orthopedic prosthetic devices, implants and grafts, initial encounter: Secondary | ICD-10-CM

## 2018-12-20 DIAGNOSIS — G8929 Other chronic pain: Secondary | ICD-10-CM

## 2018-12-21 DIAGNOSIS — R262 Difficulty in walking, not elsewhere classified: Secondary | ICD-10-CM

## 2018-12-21 DIAGNOSIS — M25571 Pain in right ankle and joints of right foot: Principal | ICD-10-CM

## 2018-12-21 DIAGNOSIS — G8929 Other chronic pain: Secondary | ICD-10-CM

## 2018-12-21 DIAGNOSIS — T8484XA Pain due to internal orthopedic prosthetic devices, implants and grafts, initial encounter: Secondary | ICD-10-CM

## 2019-01-01 MED ORDER — ASPIRIN 81 MG CHEWABLE TABLET: 81 mg | tablet | Freq: Every day | 0 refills | 42 days | Status: AC

## 2019-01-01 MED FILL — SYNTHROID 25 MCG TABLET: 30 days supply | Qty: 30 | Fill #0 | Status: AC

## 2019-01-01 MED FILL — ALBUTEROL SULFATE HFA 90 MCG/ACTUATION AEROSOL INHALER: RESPIRATORY_TRACT | 25 days supply | Qty: 8.5 | Fill #1

## 2019-01-01 MED FILL — SYNTHROID 25 MCG TABLET: ORAL | 30 days supply | Qty: 30 | Fill #0

## 2019-01-01 MED FILL — ALBUTEROL SULFATE HFA 90 MCG/ACTUATION AEROSOL INHALER: 25 days supply | Qty: 8 | Fill #1 | Status: AC

## 2019-01-01 MED FILL — HYDROCHLOROTHIAZIDE 25 MG TABLET: ORAL | 30 days supply | Qty: 30 | Fill #1

## 2019-01-01 MED FILL — HYDROCHLOROTHIAZIDE 25 MG TABLET: 30 days supply | Qty: 30 | Fill #1 | Status: AC

## 2019-01-01 MED FILL — PROPRANOLOL 10 MG TABLET: ORAL | 30 days supply | Qty: 60 | Fill #1

## 2019-01-01 MED FILL — PROPRANOLOL 10 MG TABLET: 30 days supply | Qty: 60 | Fill #1 | Status: AC

## 2019-01-03 ENCOUNTER — Ambulatory Visit: Admit: 2019-01-03 | Discharge: 2019-01-23

## 2019-01-03 ENCOUNTER — Ambulatory Visit: Admit: 2019-01-03 | Discharge: 2019-01-23 | Payer: BLUE CROSS/BLUE SHIELD

## 2019-01-03 MED FILL — ASPIRIN 81 MG CHEWABLE TABLET: ORAL | 36 days supply | Qty: 36 | Fill #0

## 2019-01-03 MED FILL — ASPIRIN 81 MG CHEWABLE TABLET: 36 days supply | Qty: 36 | Fill #0 | Status: AC

## 2019-01-09 ENCOUNTER — Ambulatory Visit: Admit: 2019-01-09 | Discharge: 2019-01-10 | Attending: Family | Primary: Family

## 2019-01-09 DIAGNOSIS — Z01419 Encounter for gynecological examination (general) (routine) without abnormal findings: Principal | ICD-10-CM

## 2019-01-09 DIAGNOSIS — M1990 Unspecified osteoarthritis, unspecified site: Principal | ICD-10-CM

## 2019-01-09 DIAGNOSIS — E039 Hypothyroidism, unspecified: Principal | ICD-10-CM

## 2019-01-09 DIAGNOSIS — K219 Gastro-esophageal reflux disease without esophagitis: Principal | ICD-10-CM

## 2019-01-09 DIAGNOSIS — H60502 Unspecified acute noninfective otitis externa, left ear: Principal | ICD-10-CM

## 2019-01-09 MED ORDER — TRAMADOL 50 MG TABLET: tablet | Freq: Three times a day (TID) | 0 refills | 7 days | Status: AC

## 2019-01-09 MED ORDER — CELECOXIB 200 MG CAPSULE: 200 mg | capsule | Freq: Two times a day (BID) | 3 refills | 30 days | Status: AC

## 2019-01-09 MED ORDER — PANTOPRAZOLE 40 MG TABLET,DELAYED RELEASE: 40 mg | tablet | Freq: Two times a day (BID) | 1 refills | 45 days | Status: AC

## 2019-01-09 MED ORDER — NEOMYCIN-POLYMYXIN-HYDROCORT 3.5 MG-10,000 UNIT/ML-1 % EAR DROPS,SUSP: 3 [drp] | mL | Freq: Three times a day (TID) | 0 refills | 22 days | Status: AC

## 2019-01-10 MED FILL — NEOMYCIN-POLYMYXIN-HYDROCORT 3.5 MG-10,000 UNIT/ML-1 % EAR DROPS,SUSP: OTIC | 22 days supply | Qty: 10 | Fill #0

## 2019-01-10 MED FILL — CELECOXIB 200 MG CAPSULE: 30 days supply | Qty: 60 | Fill #0 | Status: AC

## 2019-01-10 MED FILL — CELECOXIB 200 MG CAPSULE: ORAL | 30 days supply | Qty: 60 | Fill #0

## 2019-01-10 MED FILL — TRAMADOL 50 MG TABLET: ORAL | 6 days supply | Qty: 30 | Fill #0

## 2019-01-10 MED FILL — NEOMYCIN-POLYMYXIN-HYDROCORT 3.5 MG-10,000 UNIT/ML-1 % EAR DROPS,SUSP: 22 days supply | Qty: 10 | Fill #0 | Status: AC

## 2019-01-10 MED FILL — TRAMADOL 50 MG TABLET: 6 days supply | Qty: 30 | Fill #0 | Status: AC

## 2019-01-10 MED FILL — PANTOPRAZOLE 40 MG TABLET,DELAYED RELEASE: ORAL | 30 days supply | Qty: 60 | Fill #0

## 2019-01-10 MED FILL — PANTOPRAZOLE 40 MG TABLET,DELAYED RELEASE: 30 days supply | Qty: 60 | Fill #0 | Status: AC

## 2019-01-12 DIAGNOSIS — H6690 Otitis media, unspecified, unspecified ear: Principal | ICD-10-CM

## 2019-01-12 MED ORDER — AMOXICILLIN 875 MG TABLET: 875 mg | tablet | Freq: Two times a day (BID) | 0 refills | 10 days | Status: AC

## 2019-01-19 ENCOUNTER — Ambulatory Visit: Admit: 2019-01-19 | Discharge: 2019-01-20 | Attending: Internal Medicine | Primary: Internal Medicine

## 2019-01-19 MED ORDER — ESTRADIOL 0.01% (0.1 MG/GRAM) VAGINAL CREAM
Freq: Every day | VAGINAL | 11 refills | 30 days | Status: CP
Start: 2019-01-19 — End: 2019-01-19

## 2019-01-22 DIAGNOSIS — Z1239 Encounter for other screening for malignant neoplasm of breast: Principal | ICD-10-CM

## 2019-01-22 MED ORDER — CONJUGATED ESTROGENS 0.625 MG/GRAM VAGINAL CREAM
VAGINAL | 11 refills | 210.00000 days | Status: CP
Start: 2019-01-22 — End: 2020-01-22
  Filled 2019-01-24: qty 30, 60d supply, fill #0

## 2019-01-23 ENCOUNTER — Ambulatory Visit: Admit: 2019-01-23 | Discharge: 2019-01-23

## 2019-01-23 ENCOUNTER — Ambulatory Visit
Admit: 2019-01-23 | Discharge: 2019-01-23 | Attending: Physical Medicine & Rehabilitation | Primary: Physical Medicine & Rehabilitation

## 2019-01-23 DIAGNOSIS — M5442 Lumbago with sciatica, left side: Principal | ICD-10-CM

## 2019-01-23 DIAGNOSIS — M5441 Lumbago with sciatica, right side: Principal | ICD-10-CM

## 2019-01-23 DIAGNOSIS — M533 Sacrococcygeal disorders, not elsewhere classified: Principal | ICD-10-CM

## 2019-01-23 DIAGNOSIS — G8929 Other chronic pain: Principal | ICD-10-CM

## 2019-01-23 DIAGNOSIS — Z981 Arthrodesis status: Principal | ICD-10-CM

## 2019-01-23 DIAGNOSIS — M170 Bilateral primary osteoarthritis of knee: Principal | ICD-10-CM

## 2019-01-23 DIAGNOSIS — M961 Postlaminectomy syndrome, not elsewhere classified: Principal | ICD-10-CM

## 2019-01-24 DIAGNOSIS — J4520 Mild intermittent asthma, uncomplicated: Principal | ICD-10-CM

## 2019-01-24 MED ORDER — ALBUTEROL SULFATE HFA 90 MCG/ACTUATION AEROSOL INHALER
Freq: Four times a day (QID) | RESPIRATORY_TRACT | 1 refills | 0 days | Status: CP | PRN
Start: 2019-01-24 — End: 2020-01-24
  Filled 2019-01-26: qty 8.5, 25d supply, fill #0

## 2019-01-24 MED FILL — PREMARIN 0.625 MG/GRAM VAGINAL CREAM: 210 days supply | Qty: 30 | Fill #0 | Status: AC

## 2019-01-26 ENCOUNTER — Ambulatory Visit: Admit: 2019-01-26 | Discharge: 2019-01-27

## 2019-01-26 MED FILL — PANTOPRAZOLE 40 MG TABLET,DELAYED RELEASE: 45 days supply | Qty: 90 | Fill #0 | Status: AC

## 2019-01-26 MED FILL — ALBUTEROL SULFATE HFA 90 MCG/ACTUATION AEROSOL INHALER: 25 days supply | Qty: 8 | Fill #0 | Status: AC

## 2019-01-26 MED FILL — PANTOPRAZOLE 40 MG TABLET,DELAYED RELEASE: ORAL | 45 days supply | Qty: 90 | Fill #0

## 2019-01-26 MED FILL — CELECOXIB 200 MG CAPSULE: 30 days supply | Qty: 60 | Fill #0 | Status: AC

## 2019-01-26 MED FILL — CELECOXIB 200 MG CAPSULE: ORAL | 30 days supply | Qty: 60 | Fill #0

## 2019-01-29 ENCOUNTER — Ambulatory Visit: Admit: 2019-01-29 | Discharge: 2019-02-22

## 2019-01-31 MED FILL — SYNTHROID 25 MCG TABLET: ORAL | 60 days supply | Qty: 60 | Fill #1

## 2019-01-31 MED FILL — SYNTHROID 25 MCG TABLET: 60 days supply | Qty: 60 | Fill #1 | Status: AC

## 2019-02-22 ENCOUNTER — Ambulatory Visit: Admit: 2019-02-22 | Discharge: 2019-02-23 | Attending: Obesity Medicine | Primary: Obesity Medicine

## 2019-02-22 DIAGNOSIS — R635 Abnormal weight gain: Principal | ICD-10-CM

## 2019-02-22 DIAGNOSIS — I2699 Other pulmonary embolism without acute cor pulmonale: Principal | ICD-10-CM

## 2019-02-22 DIAGNOSIS — E785 Hyperlipidemia, unspecified: Principal | ICD-10-CM

## 2019-02-22 DIAGNOSIS — E039 Hypothyroidism, unspecified: Principal | ICD-10-CM

## 2019-02-22 DIAGNOSIS — J4520 Mild intermittent asthma, uncomplicated: Principal | ICD-10-CM

## 2019-02-22 DIAGNOSIS — R400 Somnolence: Principal | ICD-10-CM

## 2019-02-22 MED ORDER — METFORMIN ER 500 MG TABLET,EXTENDED RELEASE 24 HR
ORAL_TABLET | Freq: Every day | ORAL | 3 refills | 30.00000 days | Status: CP
Start: 2019-02-22 — End: ?
  Filled 2019-02-27: qty 60, 30d supply, fill #0

## 2019-02-26 ENCOUNTER — Encounter: Admit: 2019-02-26 | Discharge: 2019-03-24 | Payer: BLUE CROSS/BLUE SHIELD

## 2019-02-26 ENCOUNTER — Ambulatory Visit: Admit: 2019-02-26 | Discharge: 2019-03-24 | Payer: BLUE CROSS/BLUE SHIELD

## 2019-02-27 ENCOUNTER — Ambulatory Visit: Admit: 2019-02-27 | Discharge: 2019-02-27

## 2019-02-27 ENCOUNTER — Ambulatory Visit
Admit: 2019-02-27 | Discharge: 2019-02-27 | Attending: Physical Medicine & Rehabilitation | Primary: Physical Medicine & Rehabilitation

## 2019-02-27 DIAGNOSIS — M47817 Spondylosis without myelopathy or radiculopathy, lumbosacral region: Principal | ICD-10-CM

## 2019-02-27 DIAGNOSIS — M533 Sacrococcygeal disorders, not elsewhere classified: Principal | ICD-10-CM

## 2019-02-27 MED FILL — METFORMIN ER 500 MG TABLET,EXTENDED RELEASE 24 HR: 30 days supply | Qty: 60 | Fill #0 | Status: AC

## 2019-03-20 DIAGNOSIS — E039 Hypothyroidism, unspecified: Principal | ICD-10-CM

## 2019-03-20 DIAGNOSIS — R635 Abnormal weight gain: Principal | ICD-10-CM

## 2019-03-20 MED ORDER — HYDROCHLOROTHIAZIDE 25 MG TABLET
ORAL_TABLET | Freq: Every morning | ORAL | 3 refills | 30.00000 days | Status: CP
Start: 2019-03-20 — End: ?
  Filled 2019-03-21: qty 30, 30d supply, fill #0

## 2019-03-20 MED ORDER — LEVOTHYROXINE 25 MCG TABLET
ORAL_TABLET | Freq: Every day | ORAL | 3 refills | 30.00000 days | Status: CP
Start: 2019-03-20 — End: 2020-03-19
  Filled 2019-03-21: qty 30, 30d supply, fill #0

## 2019-03-21 MED FILL — POTASSIUM CHLORIDE ER 10 MEQ TABLET,EXTENDED RELEASE: 30 days supply | Qty: 30 | Fill #1 | Status: AC

## 2019-03-21 MED FILL — SYNTHROID 25 MCG TABLET: 30 days supply | Qty: 30 | Fill #0 | Status: AC

## 2019-03-21 MED FILL — PREMARIN 0.625 MG/GRAM VAGINAL CREAM: 90 days supply | Qty: 30 | Fill #0 | Status: AC

## 2019-03-21 MED FILL — CELECOXIB 200 MG CAPSULE: ORAL | 30 days supply | Qty: 60 | Fill #1

## 2019-03-21 MED FILL — PREMARIN 0.625 MG/GRAM VAGINAL CREAM: VAGINAL | 90 days supply | Qty: 30 | Fill #0

## 2019-03-21 MED FILL — ALBUTEROL SULFATE HFA 90 MCG/ACTUATION AEROSOL INHALER: 25 days supply | Qty: 8 | Fill #1 | Status: AC

## 2019-03-21 MED FILL — METFORMIN ER 500 MG TABLET,EXTENDED RELEASE 24 HR: 30 days supply | Qty: 60 | Fill #0 | Status: AC

## 2019-03-21 MED FILL — ALBUTEROL SULFATE HFA 90 MCG/ACTUATION AEROSOL INHALER: RESPIRATORY_TRACT | 25 days supply | Qty: 8.5 | Fill #1

## 2019-03-21 MED FILL — POTASSIUM CHLORIDE ER 10 MEQ TABLET,EXTENDED RELEASE: ORAL | 30 days supply | Qty: 30 | Fill #1

## 2019-03-21 MED FILL — PANTOPRAZOLE 40 MG TABLET,DELAYED RELEASE: 15 days supply | Qty: 30 | Fill #1 | Status: AC

## 2019-03-21 MED FILL — PANTOPRAZOLE 40 MG TABLET,DELAYED RELEASE: ORAL | 15 days supply | Qty: 30 | Fill #1

## 2019-03-21 MED FILL — CELECOXIB 200 MG CAPSULE: 30 days supply | Qty: 60 | Fill #1 | Status: AC

## 2019-03-21 MED FILL — METFORMIN ER 500 MG TABLET,EXTENDED RELEASE 24 HR: ORAL | 30 days supply | Qty: 60 | Fill #0

## 2019-03-21 MED FILL — HYDROCHLOROTHIAZIDE 25 MG TABLET: 30 days supply | Qty: 30 | Fill #0 | Status: AC

## 2019-04-02 ENCOUNTER — Encounter: Admit: 2019-04-02 | Discharge: 2019-04-23 | Payer: BLUE CROSS/BLUE SHIELD

## 2019-04-04 ENCOUNTER — Telehealth
Admit: 2019-04-04 | Discharge: 2019-04-05 | Payer: BLUE CROSS/BLUE SHIELD | Attending: Physician Assistant | Primary: Physician Assistant

## 2019-04-05 ENCOUNTER — Encounter
Admit: 2019-04-05 | Discharge: 2019-04-06 | Payer: BLUE CROSS/BLUE SHIELD | Attending: Obesity Medicine | Primary: Obesity Medicine

## 2019-04-05 DIAGNOSIS — J4520 Mild intermittent asthma, uncomplicated: Principal | ICD-10-CM

## 2019-04-05 DIAGNOSIS — I2699 Other pulmonary embolism without acute cor pulmonale: Principal | ICD-10-CM

## 2019-04-24 DIAGNOSIS — K219 Gastro-esophageal reflux disease without esophagitis: Principal | ICD-10-CM

## 2019-04-24 MED ORDER — POTASSIUM CHLORIDE ER 10 MEQ TABLET,EXTENDED RELEASE
ORAL_TABLET | Freq: Every day | ORAL | 1 refills | 90.00000 days | Status: CP
Start: 2019-04-24 — End: ?
  Filled 2019-04-26: qty 90, 90d supply, fill #0

## 2019-04-24 MED ORDER — PANTOPRAZOLE 40 MG TABLET,DELAYED RELEASE
ORAL_TABLET | Freq: Two times a day (BID) | ORAL | 1 refills | 45 days | Status: CP
Start: 2019-04-24 — End: ?
  Filled 2019-04-26: qty 180, 90d supply, fill #0

## 2019-04-26 MED FILL — CELECOXIB 200 MG CAPSULE: 30 days supply | Qty: 60 | Fill #2 | Status: AC

## 2019-04-26 MED FILL — SYNTHROID 25 MCG TABLET: 90 days supply | Qty: 90 | Fill #1 | Status: AC

## 2019-04-26 MED FILL — PANTOPRAZOLE 40 MG TABLET,DELAYED RELEASE: 90 days supply | Qty: 180 | Fill #0 | Status: AC

## 2019-04-26 MED FILL — METFORMIN ER 500 MG TABLET,EXTENDED RELEASE 24 HR: 60 days supply | Qty: 120 | Fill #1 | Status: AC

## 2019-04-26 MED FILL — METFORMIN ER 500 MG TABLET,EXTENDED RELEASE 24 HR: ORAL | 60 days supply | Qty: 120 | Fill #1

## 2019-04-26 MED FILL — HYDROCHLOROTHIAZIDE 25 MG TABLET: 90 days supply | Qty: 90 | Fill #1 | Status: AC

## 2019-04-26 MED FILL — POTASSIUM CHLORIDE ER 10 MEQ TABLET,EXTENDED RELEASE: 90 days supply | Qty: 90 | Fill #0 | Status: AC

## 2019-04-26 MED FILL — SYNTHROID 25 MCG TABLET: ORAL | 90 days supply | Qty: 90 | Fill #1

## 2019-04-26 MED FILL — ALBUTEROL SULFATE HFA 90 MCG/ACTUATION AEROSOL INHALER: 25 days supply | Qty: 8 | Fill #2 | Status: AC

## 2019-04-27 ENCOUNTER — Encounter: Admit: 2019-04-27 | Discharge: 2019-05-23 | Payer: BLUE CROSS/BLUE SHIELD

## 2019-04-27 ENCOUNTER — Encounter: Admit: 2019-04-27 | Discharge: 2019-04-28 | Payer: BLUE CROSS/BLUE SHIELD | Attending: Family | Primary: Family

## 2019-04-27 DIAGNOSIS — Z1211 Encounter for screening for malignant neoplasm of colon: Principal | ICD-10-CM

## 2019-04-27 DIAGNOSIS — M1990 Unspecified osteoarthritis, unspecified site: Principal | ICD-10-CM

## 2019-04-27 DIAGNOSIS — D1720 Benign lipomatous neoplasm of skin and subcutaneous tissue of unspecified limb: Principal | ICD-10-CM

## 2019-04-27 DIAGNOSIS — J4520 Mild intermittent asthma, uncomplicated: Principal | ICD-10-CM

## 2019-04-27 DIAGNOSIS — E039 Hypothyroidism, unspecified: Principal | ICD-10-CM

## 2019-04-27 DIAGNOSIS — R635 Abnormal weight gain: Principal | ICD-10-CM

## 2019-04-27 DIAGNOSIS — Z1212 Encounter for screening for malignant neoplasm of rectum: Secondary | ICD-10-CM

## 2019-04-27 MED ORDER — ASPIRIN 81 MG CHEWABLE TABLET: 81 mg | tablet | Freq: Every day | 1 refills | 90 days | Status: AC

## 2019-04-27 MED ORDER — LEVOTHYROXINE 25 MCG TABLET
ORAL_TABLET | Freq: Every day | ORAL | 3 refills | 30 days | Status: CP
Start: 2019-04-27 — End: 2020-04-26
  Filled 2019-07-19: qty 30, 30d supply, fill #0

## 2019-04-27 MED ORDER — ASPIRIN 81 MG CHEWABLE TABLET
ORAL_TABLET | Freq: Every day | ORAL | 1 refills | 90.00000 days | Status: CP
Start: 2019-04-27 — End: 2020-04-26
  Filled 2019-07-19: qty 90, 90d supply, fill #0

## 2019-04-27 MED ORDER — HYDROCHLOROTHIAZIDE 25 MG TABLET
ORAL_TABLET | Freq: Every morning | ORAL | 3 refills | 30 days | Status: CP
Start: 2019-04-27 — End: ?
  Filled 2019-04-26: qty 90, 90d supply, fill #1
  Filled 2019-07-19: qty 30, 30d supply, fill #0

## 2019-04-27 MED ORDER — FLUTICASONE PROPIONATE 50 MCG/ACTUATION NASAL SPRAY,SUSPENSION
Freq: Every day | NASAL | 3 refills | 0.00000 days | Status: CP
Start: 2019-04-27 — End: ?

## 2019-04-27 MED ORDER — CONJUGATED ESTROGENS 0.625 MG/GRAM VAGINAL CREAM
Freq: Every day | VAGINAL | 11 refills | 60 days | Status: CP
Start: 2019-04-27 — End: 2020-04-26
  Filled 2019-06-25: qty 30, 60d supply, fill #0

## 2019-04-27 MED ORDER — CELECOXIB 200 MG CAPSULE
ORAL_CAPSULE | Freq: Two times a day (BID) | ORAL | 3 refills | 30.00000 days | Status: CP
Start: 2019-04-27 — End: ?
  Filled 2019-04-26: qty 60, 30d supply, fill #2
  Filled 2019-06-25: qty 60, 30d supply, fill #0

## 2019-04-27 MED ORDER — ALBUTEROL SULFATE HFA 90 MCG/ACTUATION AEROSOL INHALER
Freq: Four times a day (QID) | RESPIRATORY_TRACT | 1 refills | 75 days | Status: CP | PRN
Start: 2019-04-27 — End: 2020-04-26
  Filled 2019-04-26: qty 8.5, 25d supply, fill #2
  Filled 2019-06-25: qty 8.5, 25d supply, fill #0

## 2019-04-30 ENCOUNTER — Encounter: Admit: 2019-04-30 | Discharge: 2019-05-01 | Payer: BLUE CROSS/BLUE SHIELD

## 2019-04-30 DIAGNOSIS — D1720 Benign lipomatous neoplasm of skin and subcutaneous tissue of unspecified limb: Principal | ICD-10-CM

## 2019-05-10 DIAGNOSIS — Z1212 Encounter for screening for malignant neoplasm of rectum: Principal | ICD-10-CM

## 2019-05-10 DIAGNOSIS — Z1211 Encounter for screening for malignant neoplasm of colon: Principal | ICD-10-CM

## 2019-05-21 ENCOUNTER — Encounter: Admit: 2019-05-21 | Discharge: 2019-05-22 | Payer: BLUE CROSS/BLUE SHIELD | Attending: Acute Care | Primary: Acute Care

## 2019-05-21 DIAGNOSIS — R2240 Localized swelling, mass and lump, unspecified lower limb: Principal | ICD-10-CM

## 2019-05-21 DIAGNOSIS — D1720 Benign lipomatous neoplasm of skin and subcutaneous tissue of unspecified limb: Principal | ICD-10-CM

## 2019-05-21 DIAGNOSIS — M7989 Other specified soft tissue disorders: Principal | ICD-10-CM

## 2019-05-25 ENCOUNTER — Encounter: Admit: 2019-05-25 | Discharge: 2019-05-26 | Payer: BLUE CROSS/BLUE SHIELD

## 2019-06-14 ENCOUNTER — Encounter
Admit: 2019-06-14 | Discharge: 2019-06-15 | Payer: BLUE CROSS/BLUE SHIELD | Attending: Obesity Medicine | Primary: Obesity Medicine

## 2019-06-14 DIAGNOSIS — I2699 Other pulmonary embolism without acute cor pulmonale: Principal | ICD-10-CM

## 2019-06-14 DIAGNOSIS — E039 Hypothyroidism, unspecified: Principal | ICD-10-CM

## 2019-06-14 DIAGNOSIS — Z86711 Personal history of pulmonary embolism: Principal | ICD-10-CM

## 2019-06-14 MED ORDER — METFORMIN ER 500 MG TABLET,EXTENDED RELEASE 24 HR
ORAL_TABLET | Freq: Every day | ORAL | 3 refills | 30 days | Status: CP
Start: 2019-06-14 — End: ?
  Filled 2019-06-25: qty 120, 30d supply, fill #0

## 2019-06-25 MED FILL — CELECOXIB 200 MG CAPSULE: 30 days supply | Qty: 60 | Fill #0 | Status: AC

## 2019-06-25 MED FILL — ALBUTEROL SULFATE HFA 90 MCG/ACTUATION AEROSOL INHALER: 25 days supply | Qty: 8 | Fill #0 | Status: AC

## 2019-06-25 MED FILL — METFORMIN ER 500 MG TABLET,EXTENDED RELEASE 24 HR: 30 days supply | Qty: 120 | Fill #0 | Status: AC

## 2019-06-25 MED FILL — PREMARIN 0.625 MG/GRAM VAGINAL CREAM: 60 days supply | Qty: 30 | Fill #0 | Status: AC

## 2019-06-27 ENCOUNTER — Encounter: Admit: 2019-06-27 | Discharge: 2019-06-28 | Payer: BLUE CROSS/BLUE SHIELD

## 2019-06-28 DIAGNOSIS — M19071 Primary osteoarthritis, right ankle and foot: Principal | ICD-10-CM

## 2019-07-05 ENCOUNTER — Encounter
Admit: 2019-07-05 | Discharge: 2019-07-06 | Payer: BLUE CROSS/BLUE SHIELD | Attending: Registered" | Primary: Registered"

## 2019-07-05 ENCOUNTER — Encounter: Admit: 2019-07-05 | Discharge: 2019-07-06 | Payer: BLUE CROSS/BLUE SHIELD | Attending: Family | Primary: Family

## 2019-07-17 DIAGNOSIS — K219 Gastro-esophageal reflux disease without esophagitis: Principal | ICD-10-CM

## 2019-07-17 MED ORDER — PANTOPRAZOLE 40 MG TABLET,DELAYED RELEASE
ORAL_TABLET | Freq: Two times a day (BID) | ORAL | 1 refills | 45 days | Status: CP
Start: 2019-07-17 — End: ?
  Filled 2019-07-19: qty 90, 45d supply, fill #0

## 2019-07-18 ENCOUNTER — Ambulatory Visit: Admit: 2019-07-18 | Discharge: 2019-07-19 | Payer: BLUE CROSS/BLUE SHIELD

## 2019-07-18 MED ORDER — DICLOFENAC 2 % TOPICAL SOLUTION IN PACKET
Freq: Two times a day (BID) | TOPICAL | 1 refills | 0.00000 days | Status: CP
Start: 2019-07-18 — End: 2019-07-18

## 2019-07-18 MED ORDER — DICLOFENAC 1 % TOPICAL GEL
Freq: Four times a day (QID) | TOPICAL | 1 refills | 13.00000 days | Status: CP
Start: 2019-07-18 — End: 2020-07-17

## 2019-07-19 MED FILL — POTASSIUM CHLORIDE ER 10 MEQ TABLET,EXTENDED RELEASE: 90 days supply | Qty: 90 | Fill #1 | Status: AC

## 2019-07-19 MED FILL — CELECOXIB 200 MG CAPSULE: 30 days supply | Qty: 60 | Fill #1 | Status: AC

## 2019-07-19 MED FILL — ASPIRIN 81 MG CHEWABLE TABLET: 90 days supply | Qty: 90 | Fill #0 | Status: AC

## 2019-07-19 MED FILL — PANTOPRAZOLE 40 MG TABLET,DELAYED RELEASE: 45 days supply | Qty: 90 | Fill #0 | Status: AC

## 2019-07-19 MED FILL — POTASSIUM CHLORIDE ER 10 MEQ TABLET,EXTENDED RELEASE: ORAL | 90 days supply | Qty: 90 | Fill #1

## 2019-07-19 MED FILL — ALBUTEROL SULFATE HFA 90 MCG/ACTUATION AEROSOL INHALER: RESPIRATORY_TRACT | 25 days supply | Qty: 8.5 | Fill #1

## 2019-07-19 MED FILL — CELECOXIB 200 MG CAPSULE: ORAL | 30 days supply | Qty: 60 | Fill #1

## 2019-07-19 MED FILL — METFORMIN ER 500 MG TABLET,EXTENDED RELEASE 24 HR: 30 days supply | Qty: 120 | Fill #1 | Status: AC

## 2019-07-19 MED FILL — HYDROCHLOROTHIAZIDE 25 MG TABLET: 30 days supply | Qty: 30 | Fill #0 | Status: AC

## 2019-07-19 MED FILL — ALBUTEROL SULFATE HFA 90 MCG/ACTUATION AEROSOL INHALER: 25 days supply | Qty: 8 | Fill #1 | Status: AC

## 2019-07-19 MED FILL — METFORMIN ER 500 MG TABLET,EXTENDED RELEASE 24 HR: ORAL | 30 days supply | Qty: 120 | Fill #1

## 2019-07-19 MED FILL — SYNTHROID 25 MCG TABLET: 30 days supply | Qty: 30 | Fill #0 | Status: AC

## 2019-08-07 ENCOUNTER — Ambulatory Visit: Admit: 2019-08-07 | Discharge: 2019-08-08 | Payer: BLUE CROSS/BLUE SHIELD

## 2019-08-07 ENCOUNTER — Encounter
Admit: 2019-08-07 | Discharge: 2019-08-08 | Payer: BLUE CROSS/BLUE SHIELD | Attending: Registered" | Primary: Registered"

## 2019-08-16 ENCOUNTER — Encounter: Admit: 2019-08-16 | Discharge: 2019-08-17 | Payer: BLUE CROSS/BLUE SHIELD

## 2019-08-16 DIAGNOSIS — M25571 Pain in right ankle and joints of right foot: Principal | ICD-10-CM

## 2019-08-17 ENCOUNTER — Institutional Professional Consult (permissible substitution): Admit: 2019-08-17 | Discharge: 2019-08-18 | Payer: BLUE CROSS/BLUE SHIELD

## 2019-08-23 ENCOUNTER — Encounter
Admit: 2019-08-23 | Discharge: 2019-08-24 | Payer: BLUE CROSS/BLUE SHIELD | Attending: Obesity Medicine | Primary: Obesity Medicine

## 2019-08-23 MED ORDER — METFORMIN ER 500 MG TABLET,EXTENDED RELEASE 24 HR
ORAL_TABLET | Freq: Every day | ORAL | 5 refills | 30.00000 days | Status: CP
Start: 2019-08-23 — End: ?
  Filled 2019-09-03: qty 120, 30d supply, fill #0

## 2019-09-03 MED FILL — METFORMIN ER 500 MG TABLET,EXTENDED RELEASE 24 HR: 30 days supply | Qty: 120 | Fill #0 | Status: AC

## 2019-09-03 MED FILL — ASPIRIN 81 MG CHEWABLE TABLET: ORAL | 90 days supply | Qty: 90 | Fill #1

## 2019-09-03 MED FILL — HYDROCHLOROTHIAZIDE 25 MG TABLET: 30 days supply | Qty: 30 | Fill #1 | Status: AC

## 2019-09-03 MED FILL — SYNTHROID 25 MCG TABLET: 30 days supply | Qty: 30 | Fill #1 | Status: AC

## 2019-09-03 MED FILL — PROAIR HFA 90 MCG/ACTUATION AEROSOL INHALER: 25 days supply | Qty: 8 | Fill #2 | Status: AC

## 2019-09-03 MED FILL — ASPIRIN 81 MG CHEWABLE TABLET: 90 days supply | Qty: 90 | Fill #1 | Status: AC

## 2019-09-03 MED FILL — CELECOXIB 200 MG CAPSULE: 30 days supply | Qty: 60 | Fill #2 | Status: AC

## 2019-09-03 MED FILL — PROAIR HFA 90 MCG/ACTUATION AEROSOL INHALER: RESPIRATORY_TRACT | 25 days supply | Qty: 8.5 | Fill #2

## 2019-09-03 MED FILL — PREMARIN 0.625 MG/GRAM VAGINAL CREAM: 60 days supply | Qty: 30 | Fill #1 | Status: AC

## 2019-09-03 MED FILL — HYDROCHLOROTHIAZIDE 25 MG TABLET: ORAL | 30 days supply | Qty: 30 | Fill #1

## 2019-09-03 MED FILL — PREMARIN 0.625 MG/GRAM VAGINAL CREAM: VAGINAL | 60 days supply | Qty: 30 | Fill #1

## 2019-09-03 MED FILL — SYNTHROID 25 MCG TABLET: ORAL | 30 days supply | Qty: 30 | Fill #1

## 2019-09-03 MED FILL — CELECOXIB 200 MG CAPSULE: ORAL | 30 days supply | Qty: 60 | Fill #2

## 2019-09-11 ENCOUNTER — Ambulatory Visit
Admit: 2019-09-11 | Discharge: 2019-09-12 | Payer: BLUE CROSS/BLUE SHIELD | Attending: Foot and Ankle Surgery | Primary: Foot and Ankle Surgery

## 2019-09-19 ENCOUNTER — Encounter
Admit: 2019-09-19 | Discharge: 2019-09-20 | Payer: BLUE CROSS/BLUE SHIELD | Attending: Registered" | Primary: Registered"

## 2019-09-19 DIAGNOSIS — Z6841 Body Mass Index (BMI) 40.0 and over, adult: Principal | ICD-10-CM

## 2019-09-28 DIAGNOSIS — M1990 Unspecified osteoarthritis, unspecified site: Principal | ICD-10-CM

## 2019-09-28 MED ORDER — ASPIRIN 81 MG CHEWABLE TABLET
ORAL_TABLET | Freq: Every day | ORAL | 1 refills | 90 days | Status: CP
Start: 2019-09-28 — End: 2020-09-27
  Filled 2019-10-02: qty 90, 90d supply, fill #0

## 2019-10-02 MED FILL — PROAIR HFA 90 MCG/ACTUATION AEROSOL INHALER: RESPIRATORY_TRACT | 25 days supply | Qty: 8.5 | Fill #3

## 2019-10-02 MED FILL — CELECOXIB 200 MG CAPSULE: 30 days supply | Qty: 60 | Fill #3 | Status: AC

## 2019-10-02 MED FILL — PANTOPRAZOLE 40 MG TABLET,DELAYED RELEASE: 45 days supply | Qty: 90 | Fill #1 | Status: AC

## 2019-10-02 MED FILL — METFORMIN ER 500 MG TABLET,EXTENDED RELEASE 24 HR: ORAL | 30 days supply | Qty: 120 | Fill #1

## 2019-10-02 MED FILL — PANTOPRAZOLE 40 MG TABLET,DELAYED RELEASE: ORAL | 45 days supply | Qty: 90 | Fill #1

## 2019-10-02 MED FILL — HYDROCHLOROTHIAZIDE 25 MG TABLET: ORAL | 30 days supply | Qty: 30 | Fill #2

## 2019-10-02 MED FILL — SYNTHROID 25 MCG TABLET: 30 days supply | Qty: 30 | Fill #2 | Status: AC

## 2019-10-02 MED FILL — METFORMIN ER 500 MG TABLET,EXTENDED RELEASE 24 HR: 30 days supply | Qty: 120 | Fill #1 | Status: AC

## 2019-10-02 MED FILL — SYNTHROID 25 MCG TABLET: ORAL | 30 days supply | Qty: 30 | Fill #2

## 2019-10-02 MED FILL — HYDROCHLOROTHIAZIDE 25 MG TABLET: 30 days supply | Qty: 30 | Fill #2 | Status: AC

## 2019-10-02 MED FILL — PROAIR HFA 90 MCG/ACTUATION AEROSOL INHALER: 25 days supply | Qty: 8 | Fill #3 | Status: AC

## 2019-10-02 MED FILL — ASPIRIN 81 MG CHEWABLE TABLET: 90 days supply | Qty: 90 | Fill #0 | Status: AC

## 2019-10-02 MED FILL — CELECOXIB 200 MG CAPSULE: ORAL | 30 days supply | Qty: 60 | Fill #3

## 2019-10-04 MED ORDER — PEG-ELECTROLYTE SOLUTION 420 GRAM ORAL SOLUTION
0 refills | 0 days | Status: CP
Start: 2019-10-04 — End: ?

## 2019-10-10 MED FILL — PEG-ELECTROLYTE SOLUTION 420 GRAM ORAL SOLUTION: 1 days supply | Qty: 4000 | Fill #0

## 2019-10-10 MED FILL — PEG-ELECTROLYTE SOLUTION 420 GRAM ORAL SOLUTION: 1 days supply | Qty: 4000 | Fill #0 | Status: AC

## 2019-10-11 ENCOUNTER — Encounter: Admit: 2019-10-11 | Discharge: 2019-10-12 | Payer: BLUE CROSS/BLUE SHIELD

## 2019-10-17 ENCOUNTER — Institutional Professional Consult (permissible substitution)
Admit: 2019-10-17 | Discharge: 2019-10-18 | Payer: BLUE CROSS/BLUE SHIELD | Attending: Registered" | Primary: Registered"

## 2019-10-23 DIAGNOSIS — R635 Abnormal weight gain: Principal | ICD-10-CM

## 2019-10-23 DIAGNOSIS — E039 Hypothyroidism, unspecified: Principal | ICD-10-CM

## 2019-10-23 DIAGNOSIS — M1990 Unspecified osteoarthritis, unspecified site: Principal | ICD-10-CM

## 2019-10-23 MED ORDER — CELECOXIB 200 MG CAPSULE
ORAL_CAPSULE | Freq: Two times a day (BID) | ORAL | 3 refills | 30.00000 days | Status: CP
Start: 2019-10-23 — End: ?
  Filled 2019-10-25: qty 60, 30d supply, fill #0

## 2019-10-23 MED ORDER — LEVOTHYROXINE 25 MCG TABLET
ORAL_TABLET | Freq: Every day | ORAL | 3 refills | 30 days | Status: CP
Start: 2019-10-23 — End: 2020-10-22
  Filled 2019-10-25: qty 30, 30d supply, fill #0

## 2019-10-25 MED FILL — PREMARIN 0.625 MG/GRAM VAGINAL CREAM: 60 days supply | Qty: 30 | Fill #2 | Status: AC

## 2019-10-25 MED FILL — PROAIR HFA 90 MCG/ACTUATION AEROSOL INHALER: RESPIRATORY_TRACT | 25 days supply | Qty: 8.5 | Fill #4

## 2019-10-25 MED FILL — SYNTHROID 25 MCG TABLET: 30 days supply | Qty: 30 | Fill #0 | Status: AC

## 2019-10-25 MED FILL — PREMARIN 0.625 MG/GRAM VAGINAL CREAM: VAGINAL | 60 days supply | Qty: 30 | Fill #2

## 2019-10-25 MED FILL — HYDROCHLOROTHIAZIDE 25 MG TABLET: ORAL | 30 days supply | Qty: 30 | Fill #3

## 2019-10-25 MED FILL — HYDROCHLOROTHIAZIDE 25 MG TABLET: 30 days supply | Qty: 30 | Fill #3 | Status: AC

## 2019-10-25 MED FILL — METFORMIN ER 500 MG TABLET,EXTENDED RELEASE 24 HR: ORAL | 30 days supply | Qty: 120 | Fill #2

## 2019-10-25 MED FILL — PROAIR HFA 90 MCG/ACTUATION AEROSOL INHALER: 25 days supply | Qty: 8 | Fill #4 | Status: AC

## 2019-10-25 MED FILL — METFORMIN ER 500 MG TABLET,EXTENDED RELEASE 24 HR: 30 days supply | Qty: 120 | Fill #2 | Status: AC

## 2019-10-25 MED FILL — CELECOXIB 200 MG CAPSULE: 30 days supply | Qty: 60 | Fill #0 | Status: AC

## 2019-10-26 ENCOUNTER — Ambulatory Visit: Admit: 2019-10-26 | Payer: BLUE CROSS/BLUE SHIELD | Attending: Family | Primary: Family

## 2019-10-30 ENCOUNTER — Ambulatory Visit
Admit: 2019-10-30 | Discharge: 2019-10-31 | Payer: BLUE CROSS/BLUE SHIELD | Attending: Optometrist | Primary: Optometrist

## 2019-10-30 DIAGNOSIS — H1010 Acute atopic conjunctivitis, unspecified eye: Principal | ICD-10-CM

## 2019-10-30 DIAGNOSIS — H524 Presbyopia: Principal | ICD-10-CM

## 2019-10-30 DIAGNOSIS — H52223 Regular astigmatism, bilateral: Principal | ICD-10-CM

## 2019-10-30 DIAGNOSIS — H04123 Dry eye syndrome of bilateral lacrimal glands: Principal | ICD-10-CM

## 2019-10-30 IMAGING — DX DG ANKLE COMPLETE 3+V*R*
3 series · 3 of 3 positions shown · non-contrast
Comparison: None.

CLINICAL DATA: RIGHT ankle injury, ankle deformity and bruising.

EXAM:
RIGHT ANKLE - COMPLETE 3+ VIEW

[ankle ap]
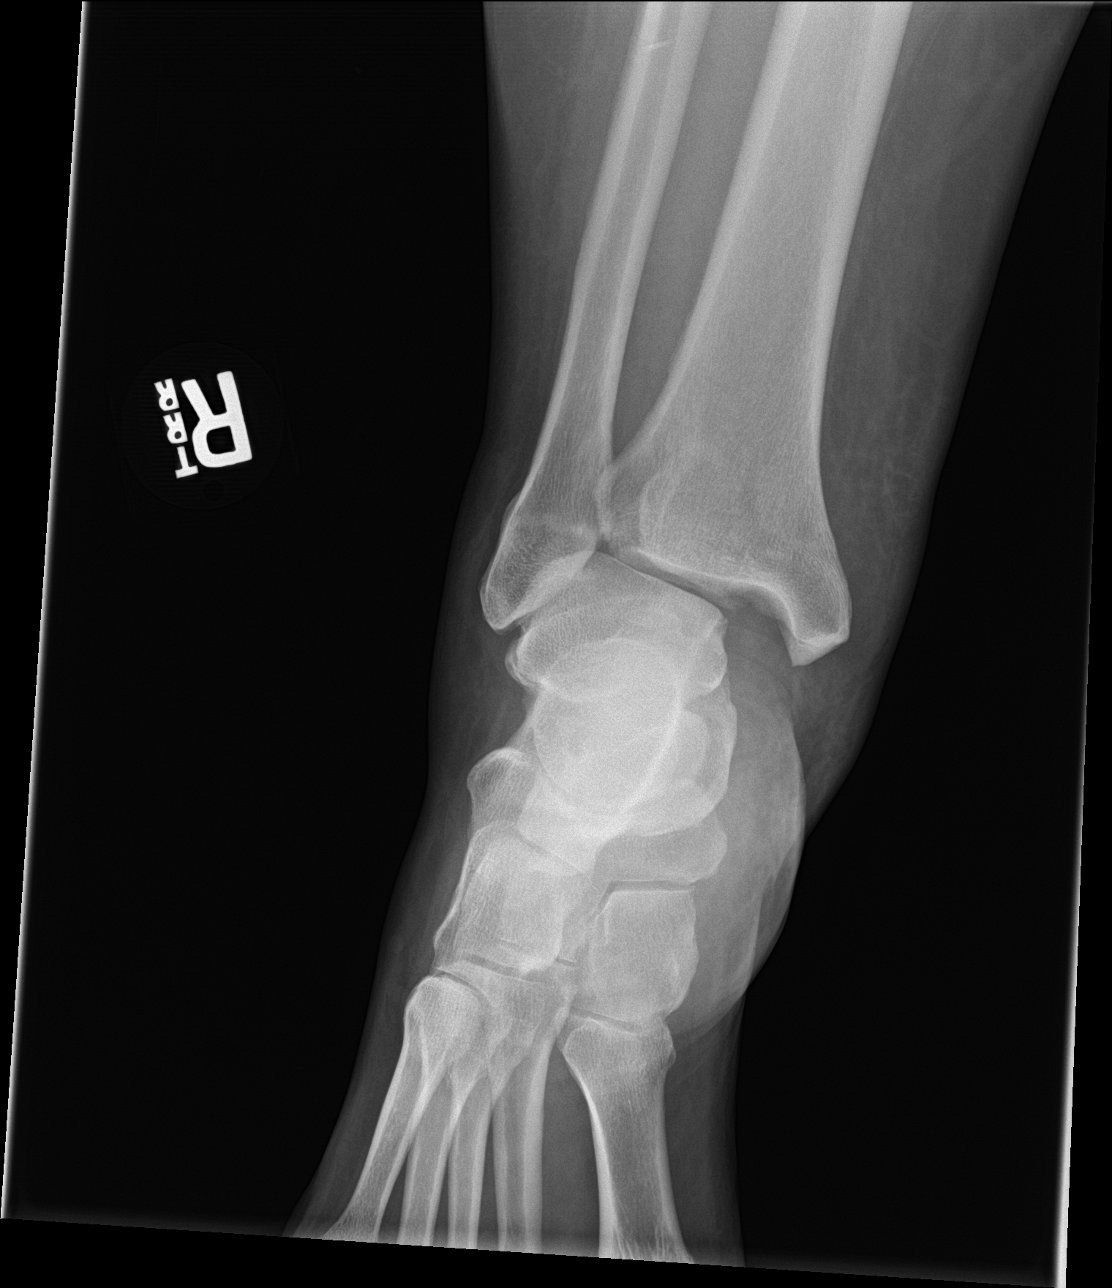

[ankle obl]
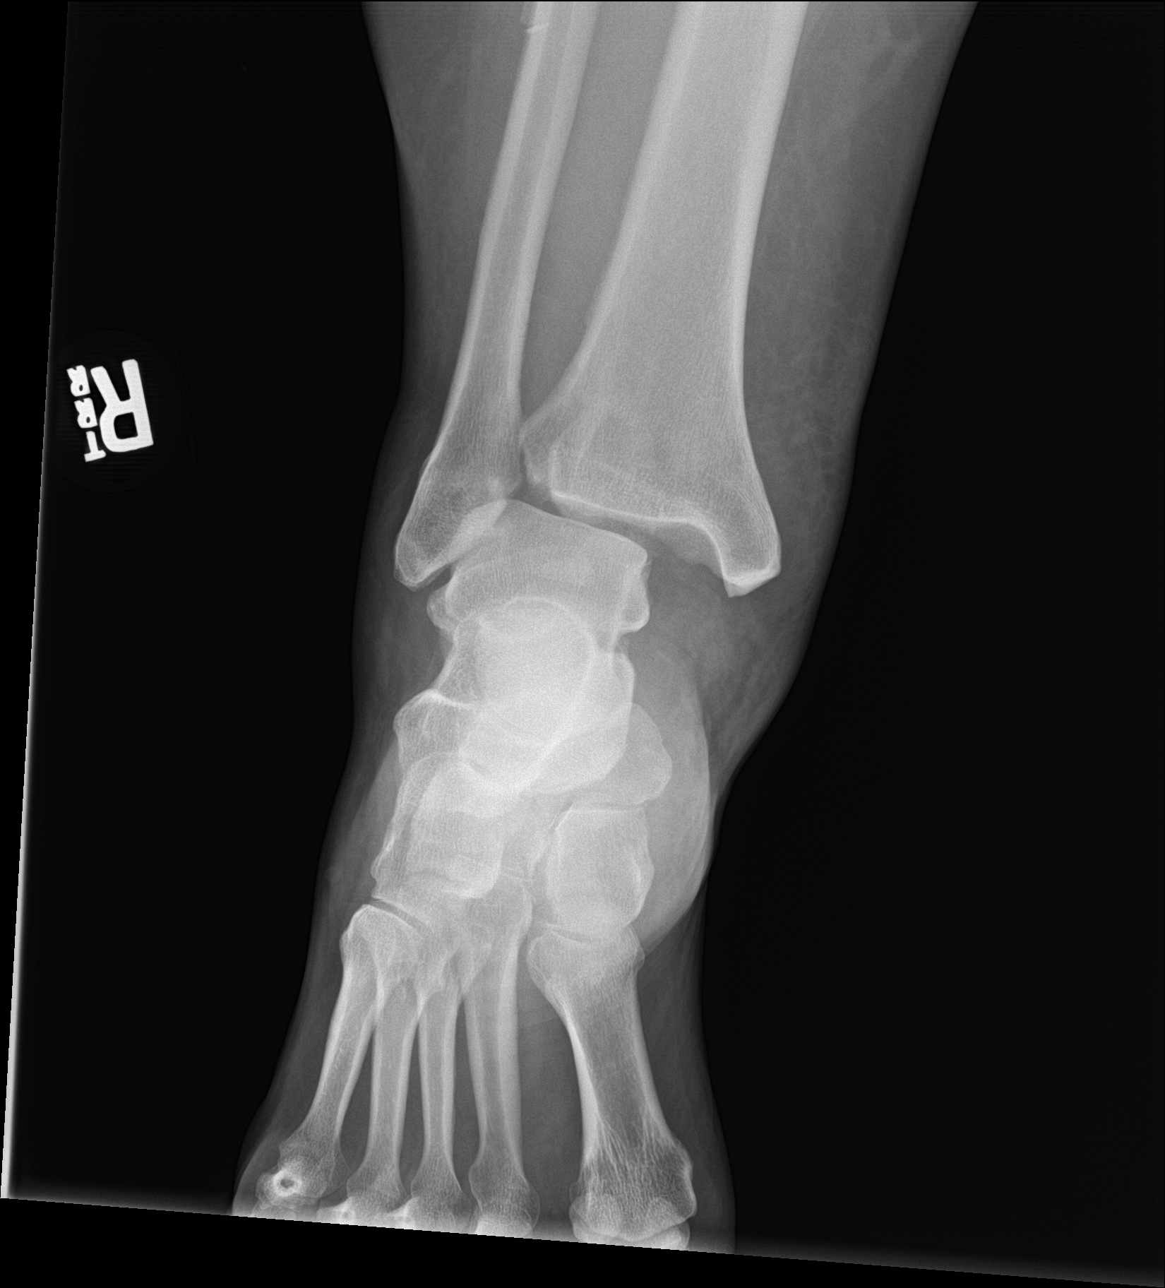

[ankle lat]
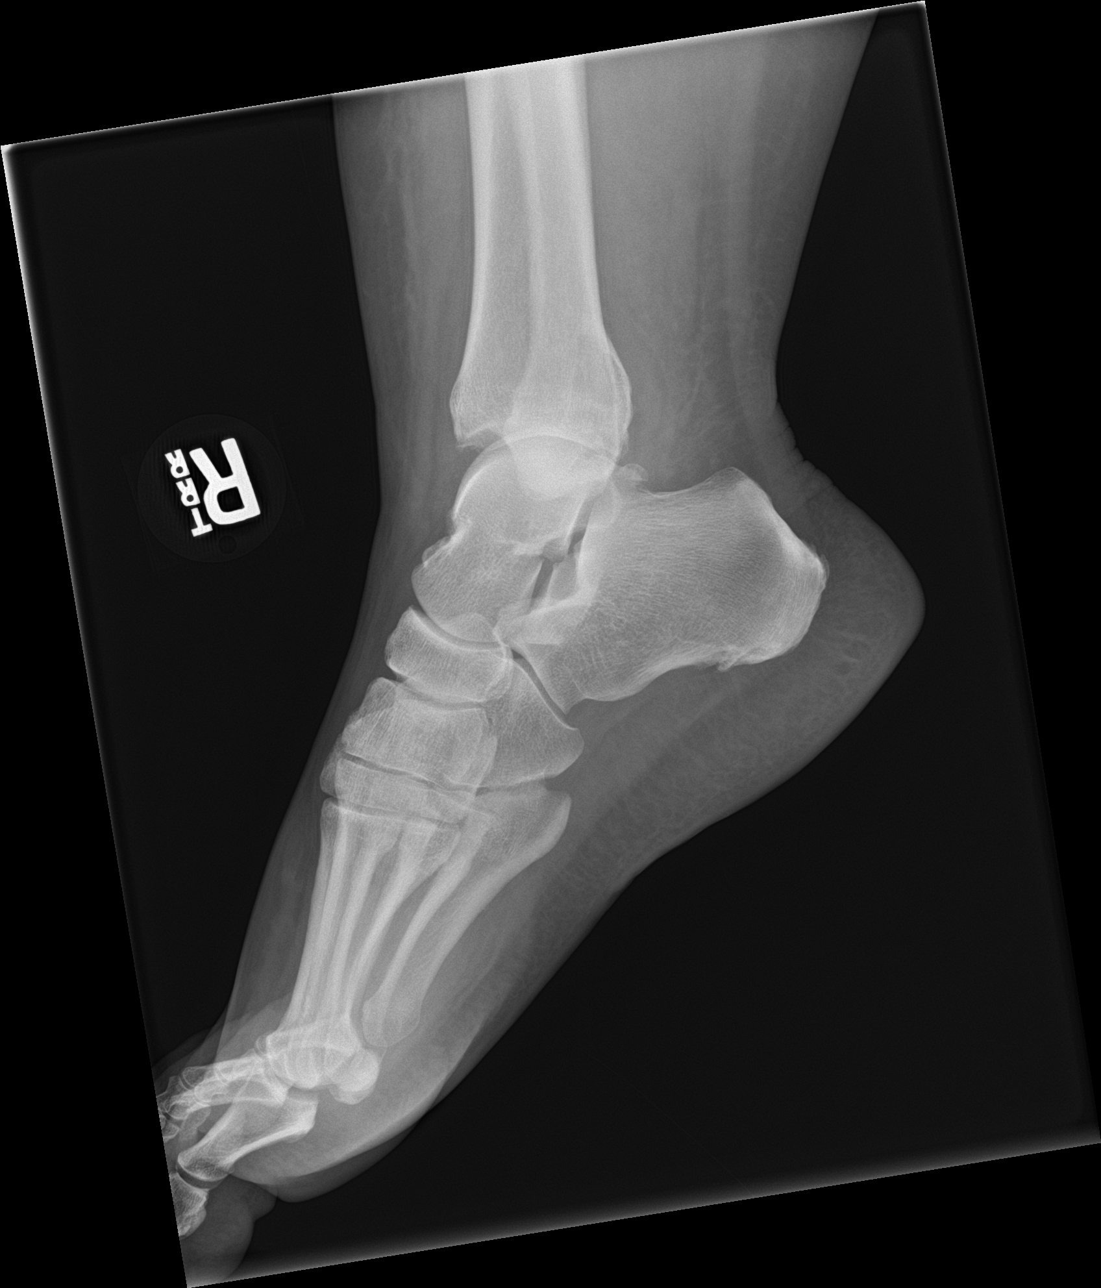

[3 of 3 positions shown; findings below may reference images not displayed]

FINDINGS: Prominent medial dislocation of the distal RIGHT tibia relative to
the underlying talus, with associated widening of the medial ankle
mortise. No convincing fracture line or displaced fracture fragment
identified within the distal tibia, distal fibula or talus.

Visualized structures of the hindfoot and midfoot appear intact and
normally aligned. Probable soft tissue swelling/edema medial to the
RIGHT ankle.
IMPRESSION: 1. Prominent medial displacement of the distal tibia relative to the
underlying talus. Probable additional medial displacement of the
distal fibula with slight overlapping of the lateral malleolus and
lateral talar dome.
2. No convincing fracture line or displaced fracture fragment
identified.
3. Soft tissue swelling/edema

## 2019-10-30 IMAGING — DX DG ANKLE 2V *R*
2 series · 2 of 2 positions shown · non-contrast
Comparison: Earlier exam of 11/07/2017

CLINICAL DATA: Post reduction and splinting

EXAM:
RIGHT ANKLE - 2 VIEW

[ankle ap]
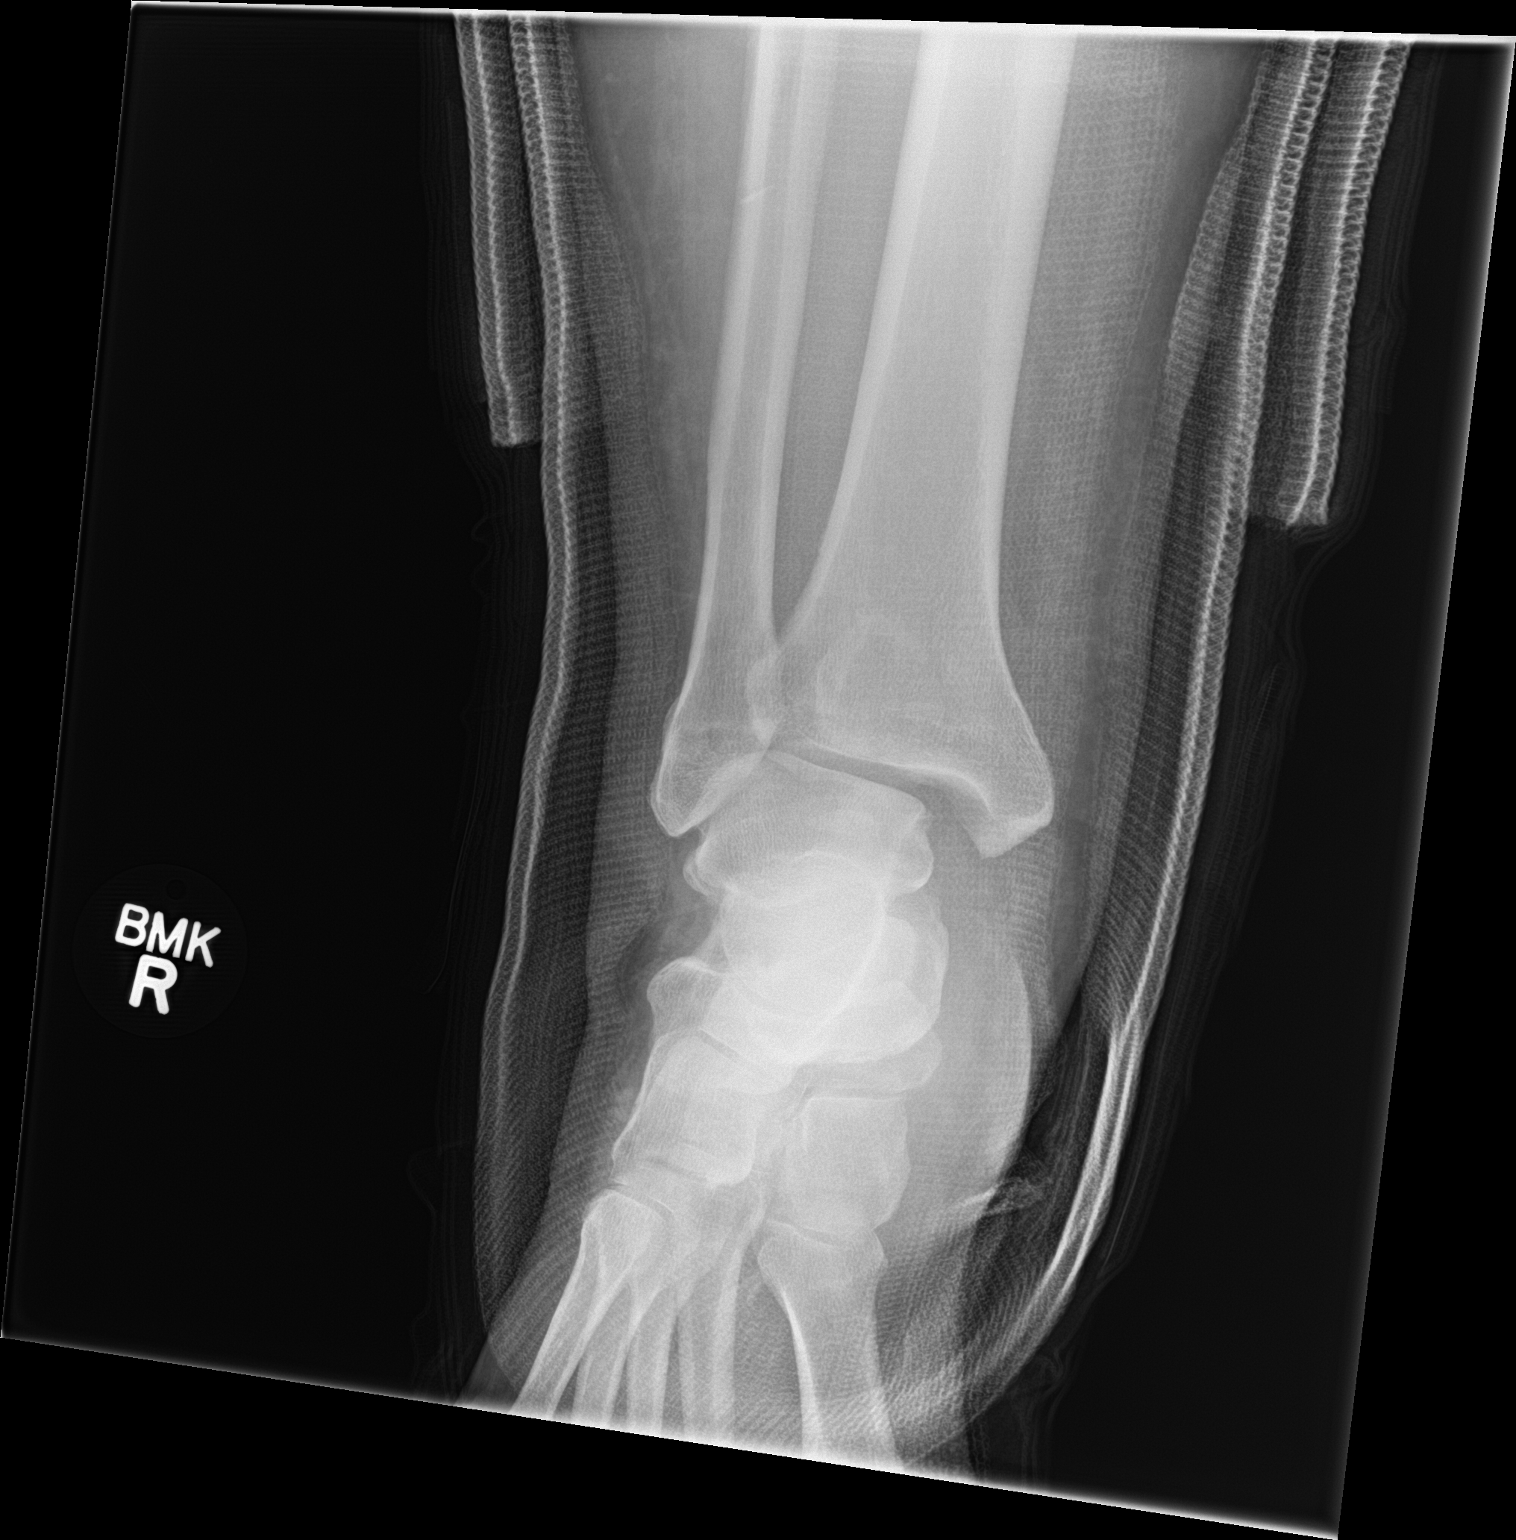

[ankle lat]
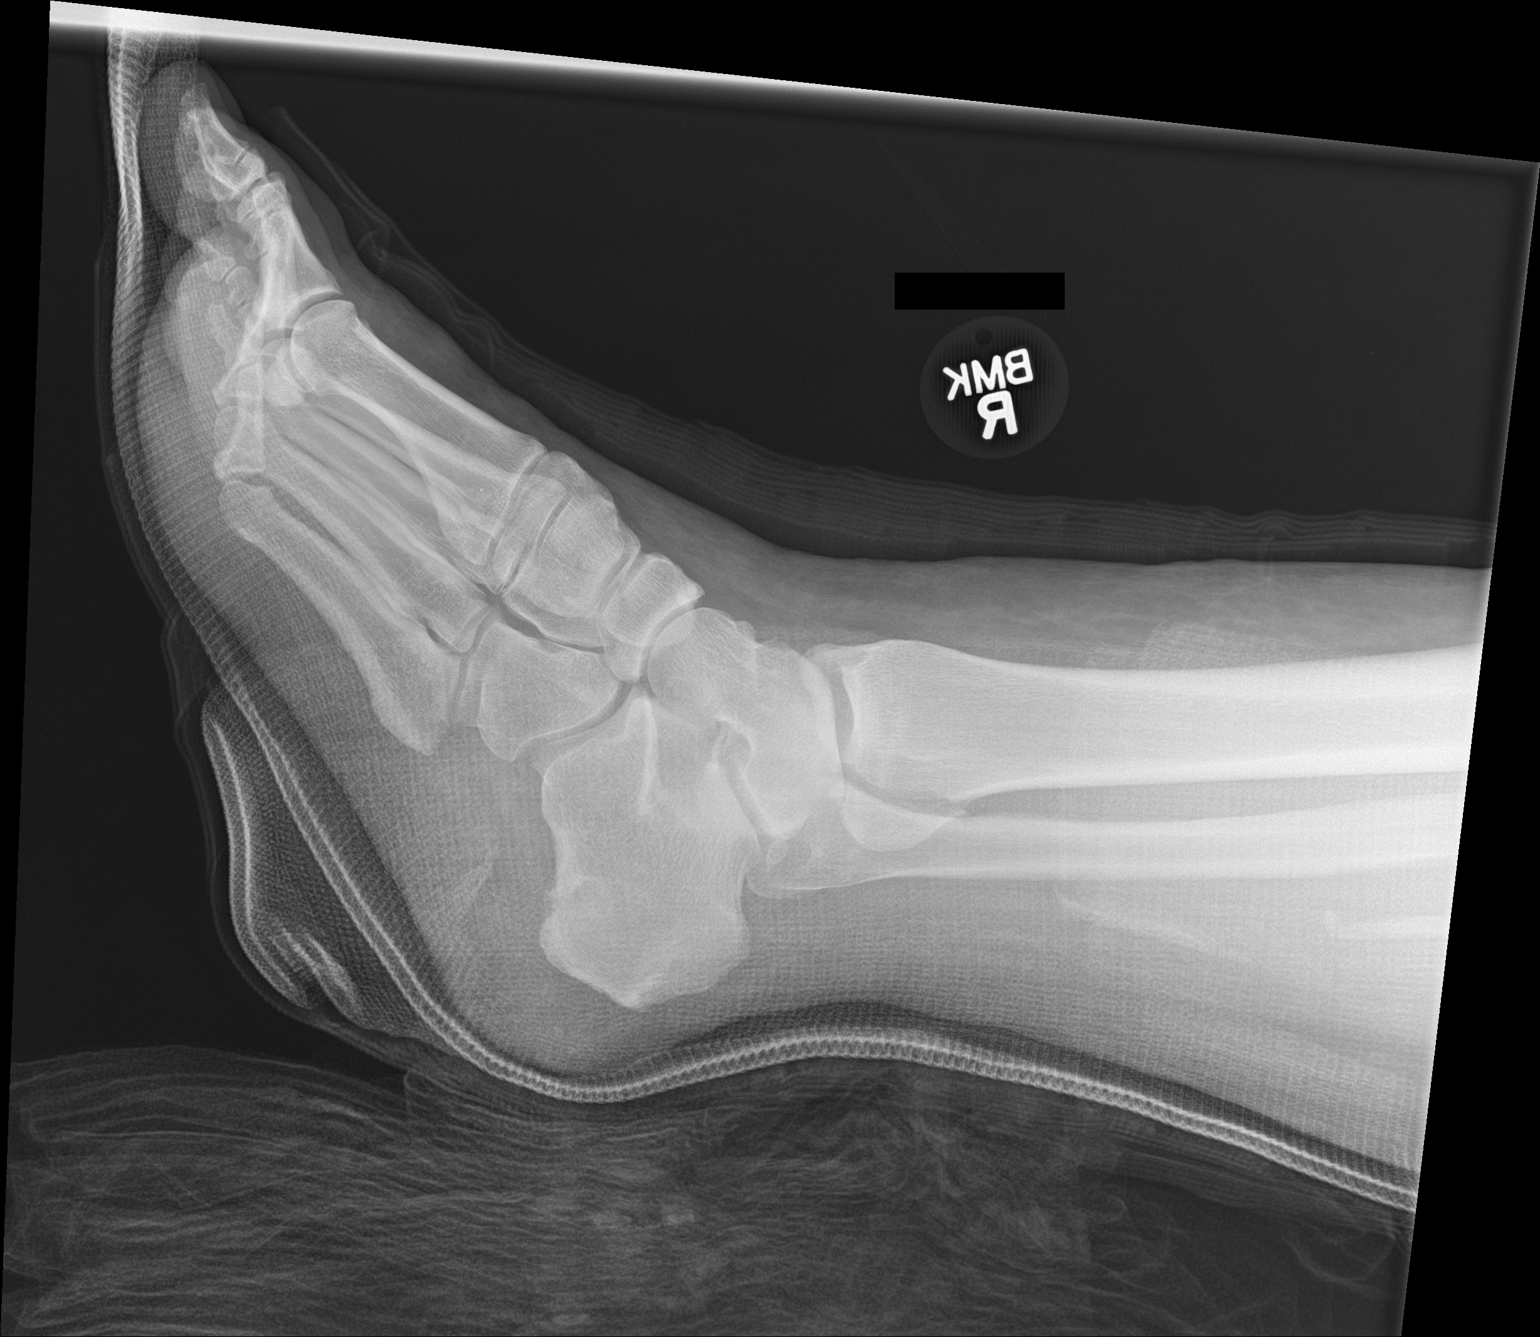

[2 of 2 positions shown; findings below may reference images not displayed]

FINDINGS: Fiberglass splint material obscures bone detail.

Decreased lateral subluxation of the talus at ankle joint versus
prior study.

No definite fracture or dislocation.

Osseous mineralization normal.
IMPRESSION: Decreased lateral subluxation of talus at ankle joint versus earlier
study.

## 2019-10-30 MED ORDER — OLOPATADINE 0.2 % EYE DROPS
Freq: Every day | OPHTHALMIC | 5 refills | 50 days | Status: CP
Start: 2019-10-30 — End: 2019-11-29
  Filled 2019-11-21: qty 2.5, 34d supply, fill #0

## 2019-10-30 MED ORDER — POLYVINYL ALCOHOL 1.4 % EYE DROPS
Freq: Four times a day (QID) | OPHTHALMIC | 6 refills | 0 days | Status: CP
Start: 2019-10-30 — End: 2019-11-29

## 2019-11-14 ENCOUNTER — Ambulatory Visit: Admit: 2019-11-14 | Discharge: 2019-11-14 | Payer: BLUE CROSS/BLUE SHIELD | Attending: Family | Primary: Family

## 2019-11-14 ENCOUNTER — Institutional Professional Consult (permissible substitution)
Admit: 2019-11-14 | Discharge: 2019-11-14 | Payer: BLUE CROSS/BLUE SHIELD | Attending: Registered" | Primary: Registered"

## 2019-11-14 DIAGNOSIS — J4520 Mild intermittent asthma, uncomplicated: Principal | ICD-10-CM

## 2019-11-14 DIAGNOSIS — E7800 Pure hypercholesterolemia, unspecified: Principal | ICD-10-CM

## 2019-11-14 DIAGNOSIS — K219 Gastro-esophageal reflux disease without esophagitis: Principal | ICD-10-CM

## 2019-11-14 DIAGNOSIS — Z6841 Body Mass Index (BMI) 40.0 and over, adult: Principal | ICD-10-CM

## 2019-11-14 DIAGNOSIS — M1990 Unspecified osteoarthritis, unspecified site: Principal | ICD-10-CM

## 2019-11-14 DIAGNOSIS — G4709 Other insomnia: Principal | ICD-10-CM

## 2019-11-14 DIAGNOSIS — R635 Abnormal weight gain: Principal | ICD-10-CM

## 2019-11-14 DIAGNOSIS — E039 Hypothyroidism, unspecified: Principal | ICD-10-CM

## 2019-11-14 DIAGNOSIS — I10 Essential (primary) hypertension: Principal | ICD-10-CM

## 2019-11-14 MED ORDER — FLUTICASONE PROPIONATE 50 MCG/ACTUATION NASAL SPRAY,SUSPENSION
Freq: Every day | NASAL | 3 refills | 0 days | Status: CP
Start: 2019-11-14 — End: ?
  Filled 2019-11-21: qty 16, 34d supply, fill #0

## 2019-11-14 MED ORDER — VANIQA 13.9 % TOPICAL CREAM
0 refills | 0 days | Status: CP
Start: 2019-11-14 — End: ?

## 2019-11-14 MED ORDER — HYDROCHLOROTHIAZIDE 25 MG TABLET
ORAL_TABLET | Freq: Every morning | ORAL | 3 refills | 30 days | Status: CP
Start: 2019-11-14 — End: ?
  Filled 2019-11-21: qty 30, 30d supply, fill #0

## 2019-11-14 MED ORDER — CELECOXIB 200 MG CAPSULE
ORAL_CAPSULE | Freq: Two times a day (BID) | ORAL | 3 refills | 30.00000 days | Status: CP
Start: 2019-11-14 — End: ?
  Filled 2019-11-21: qty 60, 30d supply, fill #0

## 2019-11-14 MED ORDER — TRAMADOL 50 MG TABLET
ORAL_TABLET | Freq: Three times a day (TID) | ORAL | 0 refills | 7.00000 days | Status: CP | PRN
Start: 2019-11-14 — End: ?
  Filled 2019-11-23: qty 30, 7d supply, fill #0

## 2019-11-14 MED ORDER — CONJUGATED ESTROGENS 0.625 MG/GRAM VAGINAL CREAM
Freq: Every day | VAGINAL | 11 refills | 60.00000 days | Status: CP
Start: 2019-11-14 — End: 2020-11-13
  Filled 2019-12-21: qty 30, 60d supply, fill #0

## 2019-11-14 MED ORDER — METFORMIN ER 500 MG TABLET,EXTENDED RELEASE 24 HR
ORAL_TABLET | Freq: Every day | ORAL | 5 refills | 30.00000 days | Status: CP
Start: 2019-11-14 — End: ?
  Filled 2019-11-21: qty 120, 30d supply, fill #0

## 2019-11-14 MED ORDER — ASPIRIN 81 MG CHEWABLE TABLET
ORAL_TABLET | Freq: Every day | ORAL | 1 refills | 90.00000 days | Status: CP
Start: 2019-11-14 — End: 2020-11-13

## 2019-11-14 MED ORDER — POTASSIUM CHLORIDE ER 10 MEQ TABLET,EXTENDED RELEASE
ORAL_TABLET | Freq: Every day | ORAL | 1 refills | 90.00000 days | Status: CP
Start: 2019-11-14 — End: ?
  Filled 2019-11-21: qty 90, 90d supply, fill #0

## 2019-11-14 MED ORDER — MIRTAZAPINE 15 MG TABLET
ORAL_TABLET | Freq: Every evening | ORAL | 3 refills | 30 days | Status: CP
Start: 2019-11-14 — End: 2019-12-14
  Filled 2019-11-21: qty 30, 30d supply, fill #0

## 2019-11-14 MED ORDER — PANTOPRAZOLE 40 MG TABLET,DELAYED RELEASE
ORAL_TABLET | Freq: Two times a day (BID) | ORAL | 1 refills | 45 days | Status: CP
Start: 2019-11-14 — End: ?
  Filled 2019-11-21: qty 90, 45d supply, fill #0

## 2019-11-14 MED ORDER — ORLISTAT 120 MG CAPSULE
ORAL_CAPSULE | Freq: Three times a day (TID) | ORAL | 3 refills | 30.00000 days | Status: CP
Start: 2019-11-14 — End: 2019-11-14

## 2019-11-14 MED ORDER — LEVOTHYROXINE 50 MCG TABLET
ORAL_TABLET | Freq: Every day | ORAL | 3 refills | 90 days | Status: CP
Start: 2019-11-14 — End: ?
  Filled 2019-11-23: qty 90, 90d supply, fill #0

## 2019-11-14 MED ORDER — ALBUTEROL SULFATE HFA 90 MCG/ACTUATION AEROSOL INHALER
Freq: Four times a day (QID) | RESPIRATORY_TRACT | 1 refills | 75 days | Status: CP | PRN
Start: 2019-11-14 — End: 2020-11-13
  Filled 2019-11-21: qty 25.5, 75d supply, fill #0

## 2019-11-21 DIAGNOSIS — E7800 Pure hypercholesterolemia, unspecified: Principal | ICD-10-CM

## 2019-11-21 MED FILL — OLOPATADINE 0.2 % EYE DROPS: 34 days supply | Qty: 2 | Fill #0 | Status: AC

## 2019-11-21 MED FILL — PROAIR HFA 90 MCG/ACTUATION AEROSOL INHALER: 75 days supply | Qty: 26 | Fill #0 | Status: AC

## 2019-11-21 MED FILL — CELECOXIB 200 MG CAPSULE: 30 days supply | Qty: 60 | Fill #0 | Status: AC

## 2019-11-21 MED FILL — POTASSIUM CHLORIDE ER 10 MEQ TABLET,EXTENDED RELEASE: 90 days supply | Qty: 90 | Fill #0 | Status: AC

## 2019-11-21 MED FILL — MIRTAZAPINE 15 MG TABLET: 30 days supply | Qty: 30 | Fill #0 | Status: AC

## 2019-11-21 MED FILL — HYDROCHLOROTHIAZIDE 25 MG TABLET: 30 days supply | Qty: 30 | Fill #0 | Status: AC

## 2019-11-21 MED FILL — FLUTICASONE PROPIONATE 50 MCG/ACTUATION NASAL SPRAY,SUSPENSION: 34 days supply | Qty: 16 | Fill #0 | Status: AC

## 2019-11-21 MED FILL — PANTOPRAZOLE 40 MG TABLET,DELAYED RELEASE: 45 days supply | Qty: 90 | Fill #0 | Status: AC

## 2019-11-21 MED FILL — METFORMIN ER 500 MG TABLET,EXTENDED RELEASE 24 HR: 30 days supply | Qty: 120 | Fill #0 | Status: AC

## 2019-11-22 DIAGNOSIS — E7800 Pure hypercholesterolemia, unspecified: Principal | ICD-10-CM

## 2019-11-23 MED FILL — TRAMADOL 50 MG TABLET: 7 days supply | Qty: 30 | Fill #0 | Status: AC

## 2019-11-23 MED FILL — SYNTHROID 50 MCG TABLET: 90 days supply | Qty: 90 | Fill #0 | Status: AC

## 2019-11-27 ENCOUNTER — Ambulatory Visit: Admit: 2019-11-27 | Payer: BLUE CROSS/BLUE SHIELD

## 2019-11-30 MED ORDER — PEG 3350-ELECTROLYTES 236 GRAM-22.74 GRAM-6.74 GRAM-5.86 GRAM SOLUTION
0 refills | 0.00000 days | Status: CP
Start: 2019-11-30 — End: ?
  Filled 2019-11-30: qty 4000, 1d supply, fill #0

## 2019-11-30 MED FILL — PEG 3350-ELECTROLYTES 236 GRAM-22.74 GRAM-6.74 GRAM-5.86 GRAM SOLUTION: 1 days supply | Qty: 4000 | Fill #0 | Status: AC

## 2019-12-10 MED FILL — SYNTHROID 50 MCG TABLET: 90 days supply | Qty: 90 | Fill #1 | Status: AC

## 2019-12-10 MED FILL — SYNTHROID 50 MCG TABLET: ORAL | 90 days supply | Qty: 90 | Fill #1

## 2019-12-20 ENCOUNTER — Ambulatory Visit: Admit: 2019-12-20 | Discharge: 2019-12-20 | Payer: MEDICAID

## 2019-12-20 ENCOUNTER — Encounter
Admit: 2019-12-20 | Discharge: 2019-12-20 | Payer: BLUE CROSS/BLUE SHIELD | Attending: Certified Registered" | Primary: Certified Registered"

## 2019-12-21 MED FILL — HYDROCHLOROTHIAZIDE 25 MG TABLET: 30 days supply | Qty: 30 | Fill #1 | Status: AC

## 2019-12-21 MED FILL — PREMARIN 0.625 MG/GRAM VAGINAL CREAM: 60 days supply | Qty: 30 | Fill #0 | Status: AC

## 2019-12-21 MED FILL — METFORMIN ER 500 MG TABLET,EXTENDED RELEASE 24 HR: ORAL | 30 days supply | Qty: 120 | Fill #1

## 2019-12-21 MED FILL — HYDROCHLOROTHIAZIDE 25 MG TABLET: ORAL | 30 days supply | Qty: 30 | Fill #1

## 2019-12-21 MED FILL — METFORMIN ER 500 MG TABLET,EXTENDED RELEASE 24 HR: 30 days supply | Qty: 120 | Fill #1 | Status: AC

## 2019-12-21 MED FILL — CELECOXIB 200 MG CAPSULE: ORAL | 30 days supply | Qty: 60 | Fill #1

## 2019-12-21 MED FILL — CELECOXIB 200 MG CAPSULE: 30 days supply | Qty: 60 | Fill #1 | Status: AC

## 2020-01-02 ENCOUNTER — Ambulatory Visit
Admit: 2020-01-02 | Discharge: 2020-01-03 | Payer: BLUE CROSS/BLUE SHIELD | Attending: Obesity Medicine | Primary: Obesity Medicine

## 2020-01-02 DIAGNOSIS — Z6841 Body Mass Index (BMI) 40.0 and over, adult: Principal | ICD-10-CM

## 2020-01-02 DIAGNOSIS — R7303 Prediabetes: Principal | ICD-10-CM

## 2020-01-02 DIAGNOSIS — R7989 Other specified abnormal findings of blood chemistry: Principal | ICD-10-CM

## 2020-01-02 MED ORDER — LIRAGLUTIDE 0.6 MG/0.1 ML (18 MG/3 ML) SUBCUTANEOUS PEN INJECTOR
SUBCUTANEOUS | 5 refills | 34 days | Status: CP
Start: 2020-01-02 — End: 2021-01-15
  Filled 2020-01-08: qty 9, 34d supply, fill #0

## 2020-01-02 MED ORDER — PEN NEEDLE, DIABETIC 32 GAUGE X 5/32" (4 MM)
Freq: Every day | 5 refills | 30.00000 days | Status: CP
Start: 2020-01-02 — End: ?
  Filled 2020-01-08: qty 100, 34d supply, fill #0

## 2020-01-08 MED FILL — BD ULTRA-FINE NANO PEN NEEDLE 32 GAUGE X 5/32" (4 MM): 34 days supply | Qty: 100 | Fill #0 | Status: AC

## 2020-01-08 MED FILL — VICTOZA 3-PAK 0.6 MG/0.1 ML (18 MG/3 ML) SUBCUTANEOUS PEN INJECTOR: 34 days supply | Qty: 9 | Fill #0 | Status: AC

## 2020-01-15 ENCOUNTER — Ambulatory Visit
Admit: 2020-01-15 | Discharge: 2020-01-16 | Payer: BLUE CROSS/BLUE SHIELD | Attending: Physical Medicine & Rehabilitation | Primary: Physical Medicine & Rehabilitation

## 2020-01-22 MED FILL — CELECOXIB 200 MG CAPSULE: 30 days supply | Qty: 60 | Fill #2 | Status: AC

## 2020-01-22 MED FILL — PANTOPRAZOLE 40 MG TABLET,DELAYED RELEASE: 45 days supply | Qty: 90 | Fill #1 | Status: AC

## 2020-01-22 MED FILL — CELECOXIB 200 MG CAPSULE: ORAL | 30 days supply | Qty: 60 | Fill #2

## 2020-01-22 MED FILL — METFORMIN ER 500 MG TABLET,EXTENDED RELEASE 24 HR: ORAL | 30 days supply | Qty: 120 | Fill #2

## 2020-01-22 MED FILL — HYDROCHLOROTHIAZIDE 25 MG TABLET: ORAL | 30 days supply | Qty: 30 | Fill #2

## 2020-01-22 MED FILL — PANTOPRAZOLE 40 MG TABLET,DELAYED RELEASE: ORAL | 45 days supply | Qty: 90 | Fill #1

## 2020-01-22 MED FILL — METFORMIN ER 500 MG TABLET,EXTENDED RELEASE 24 HR: 30 days supply | Qty: 120 | Fill #2 | Status: AC

## 2020-01-22 MED FILL — HYDROCHLOROTHIAZIDE 25 MG TABLET: 30 days supply | Qty: 30 | Fill #2 | Status: AC

## 2020-01-24 ENCOUNTER — Ambulatory Visit: Admit: 2020-01-24 | Discharge: 2020-01-25 | Payer: BLUE CROSS/BLUE SHIELD

## 2020-01-24 DIAGNOSIS — M25571 Pain in right ankle and joints of right foot: Principal | ICD-10-CM

## 2020-01-24 DIAGNOSIS — G8929 Other chronic pain: Principal | ICD-10-CM

## 2020-01-24 DIAGNOSIS — M19071 Primary osteoarthritis, right ankle and foot: Principal | ICD-10-CM

## 2020-01-24 DIAGNOSIS — M170 Bilateral primary osteoarthritis of knee: Principal | ICD-10-CM

## 2020-01-24 DIAGNOSIS — M25561 Pain in right knee: Principal | ICD-10-CM

## 2020-01-28 ENCOUNTER — Ambulatory Visit
Admit: 2020-01-28 | Discharge: 2020-01-29 | Payer: BLUE CROSS/BLUE SHIELD | Attending: Physical Medicine & Rehabilitation | Primary: Physical Medicine & Rehabilitation

## 2020-01-28 ENCOUNTER — Ambulatory Visit: Admit: 2020-01-28 | Discharge: 2020-01-29 | Payer: BLUE CROSS/BLUE SHIELD

## 2020-01-28 ENCOUNTER — Telehealth
Admit: 2020-01-28 | Discharge: 2020-01-29 | Payer: BLUE CROSS/BLUE SHIELD | Attending: Registered" | Primary: Registered"

## 2020-01-28 DIAGNOSIS — Z79899 Other long term (current) drug therapy: Principal | ICD-10-CM

## 2020-01-28 DIAGNOSIS — Z981 Arthrodesis status: Principal | ICD-10-CM

## 2020-01-28 DIAGNOSIS — M5442 Lumbago with sciatica, left side: Principal | ICD-10-CM

## 2020-01-28 DIAGNOSIS — Z882 Allergy status to sulfonamides status: Principal | ICD-10-CM

## 2020-01-28 DIAGNOSIS — Z7984 Long term (current) use of oral hypoglycemic drugs: Principal | ICD-10-CM

## 2020-01-28 DIAGNOSIS — G8929 Other chronic pain: Principal | ICD-10-CM

## 2020-01-28 DIAGNOSIS — M533 Sacrococcygeal disorders, not elsewhere classified: Principal | ICD-10-CM

## 2020-01-28 DIAGNOSIS — M47816 Spondylosis without myelopathy or radiculopathy, lumbar region: Principal | ICD-10-CM

## 2020-01-28 DIAGNOSIS — K219 Gastro-esophageal reflux disease without esophagitis: Principal | ICD-10-CM

## 2020-01-28 DIAGNOSIS — M5441 Lumbago with sciatica, right side: Principal | ICD-10-CM

## 2020-01-28 DIAGNOSIS — E785 Hyperlipidemia, unspecified: Principal | ICD-10-CM

## 2020-01-28 DIAGNOSIS — Z79890 Hormone replacement therapy (postmenopausal): Principal | ICD-10-CM

## 2020-01-28 DIAGNOSIS — R7303 Prediabetes: Principal | ICD-10-CM

## 2020-01-28 DIAGNOSIS — J45909 Unspecified asthma, uncomplicated: Principal | ICD-10-CM

## 2020-01-28 DIAGNOSIS — M47817 Spondylosis without myelopathy or radiculopathy, lumbosacral region: Principal | ICD-10-CM

## 2020-01-28 DIAGNOSIS — Z888 Allergy status to other drugs, medicaments and biological substances status: Principal | ICD-10-CM

## 2020-01-28 DIAGNOSIS — M961 Postlaminectomy syndrome, not elsewhere classified: Principal | ICD-10-CM

## 2020-01-28 DIAGNOSIS — Z86711 Personal history of pulmonary embolism: Principal | ICD-10-CM

## 2020-01-28 DIAGNOSIS — Z6841 Body Mass Index (BMI) 40.0 and over, adult: Principal | ICD-10-CM

## 2020-01-28 DIAGNOSIS — M549 Dorsalgia, unspecified: Principal | ICD-10-CM

## 2020-02-04 MED FILL — VICTOZA 3-PAK 0.6 MG/0.1 ML (18 MG/3 ML) SUBCUTANEOUS PEN INJECTOR: SUBCUTANEOUS | 30 days supply | Qty: 9 | Fill #1

## 2020-02-04 MED FILL — VICTOZA 3-PAK 0.6 MG/0.1 ML (18 MG/3 ML) SUBCUTANEOUS PEN INJECTOR: 30 days supply | Qty: 9 | Fill #1 | Status: AC

## 2020-02-20 MED FILL — CELECOXIB 200 MG CAPSULE: 30 days supply | Qty: 60 | Fill #3 | Status: AC

## 2020-02-20 MED FILL — PREMARIN 0.625 MG/GRAM VAGINAL CREAM: 60 days supply | Qty: 30 | Fill #1 | Status: AC

## 2020-02-20 MED FILL — BD ULTRA-FINE NANO PEN NEEDLE 32 GAUGE X 5/32" (4 MM): 34 days supply | Qty: 100 | Fill #1

## 2020-02-20 MED FILL — METFORMIN ER 500 MG TABLET,EXTENDED RELEASE 24 HR: 30 days supply | Qty: 120 | Fill #3 | Status: AC

## 2020-02-20 MED FILL — CELECOXIB 200 MG CAPSULE: ORAL | 30 days supply | Qty: 60 | Fill #3

## 2020-02-20 MED FILL — PREMARIN 0.625 MG/GRAM VAGINAL CREAM: VAGINAL | 60 days supply | Qty: 30 | Fill #1

## 2020-02-20 MED FILL — BD ULTRA-FINE NANO PEN NEEDLE 32 GAUGE X 5/32" (4 MM): 34 days supply | Qty: 100 | Fill #1 | Status: AC

## 2020-02-20 MED FILL — HYDROCHLOROTHIAZIDE 25 MG TABLET: ORAL | 30 days supply | Qty: 30 | Fill #3

## 2020-02-20 MED FILL — METFORMIN ER 500 MG TABLET,EXTENDED RELEASE 24 HR: ORAL | 30 days supply | Qty: 120 | Fill #3

## 2020-02-20 MED FILL — HYDROCHLOROTHIAZIDE 25 MG TABLET: 30 days supply | Qty: 30 | Fill #3 | Status: AC

## 2020-02-22 DIAGNOSIS — Z1231 Encounter for screening mammogram for malignant neoplasm of breast: Principal | ICD-10-CM

## 2020-02-26 ENCOUNTER — Ambulatory Visit: Admit: 2020-02-26 | Discharge: 2020-02-27 | Payer: BLUE CROSS/BLUE SHIELD

## 2020-02-26 DIAGNOSIS — Z1231 Encounter for screening mammogram for malignant neoplasm of breast: Principal | ICD-10-CM

## 2020-03-03 DIAGNOSIS — G894 Chronic pain syndrome: Secondary | ICD-10-CM | POA: Insufficient documentation

## 2020-03-03 MED FILL — VICTOZA 3-PAK 0.6 MG/0.1 ML (18 MG/3 ML) SUBCUTANEOUS PEN INJECTOR: 30 days supply | Qty: 9 | Fill #2 | Status: AC

## 2020-03-03 MED FILL — SYNTHROID 50 MCG TABLET: ORAL | 90 days supply | Qty: 90 | Fill #2

## 2020-03-03 MED FILL — VICTOZA 3-PAK 0.6 MG/0.1 ML (18 MG/3 ML) SUBCUTANEOUS PEN INJECTOR: SUBCUTANEOUS | 30 days supply | Qty: 9 | Fill #2

## 2020-03-03 MED FILL — SYNTHROID 50 MCG TABLET: 90 days supply | Qty: 90 | Fill #2 | Status: AC

## 2020-03-17 DIAGNOSIS — R635 Abnormal weight gain: Principal | ICD-10-CM

## 2020-03-17 DIAGNOSIS — K219 Gastro-esophageal reflux disease without esophagitis: Principal | ICD-10-CM

## 2020-03-17 MED ORDER — PANTOPRAZOLE 40 MG TABLET,DELAYED RELEASE
ORAL_TABLET | Freq: Two times a day (BID) | ORAL | 1 refills | 45 days | Status: CP
Start: 2020-03-17 — End: ?
  Filled 2020-03-19: qty 90, 45d supply, fill #0

## 2020-03-17 MED ORDER — HYDROCHLOROTHIAZIDE 25 MG TABLET
ORAL_TABLET | Freq: Every morning | ORAL | 3 refills | 30.00000 days | Status: CP
Start: 2020-03-17 — End: ?
  Filled 2020-03-19: qty 30, 30d supply, fill #0

## 2020-03-19 MED FILL — HYDROCHLOROTHIAZIDE 25 MG TABLET: 30 days supply | Qty: 30 | Fill #0 | Status: AC

## 2020-03-19 MED FILL — METFORMIN ER 500 MG TABLET,EXTENDED RELEASE 24 HR: ORAL | 30 days supply | Qty: 120 | Fill #4

## 2020-03-19 MED FILL — PANTOPRAZOLE 40 MG TABLET,DELAYED RELEASE: 45 days supply | Qty: 90 | Fill #0 | Status: AC

## 2020-03-19 MED FILL — METFORMIN ER 500 MG TABLET,EXTENDED RELEASE 24 HR: 30 days supply | Qty: 120 | Fill #4 | Status: AC

## 2020-03-24 DIAGNOSIS — Z6841 Body Mass Index (BMI) 40.0 and over, adult: Principal | ICD-10-CM

## 2020-03-24 DIAGNOSIS — R635 Abnormal weight gain: Principal | ICD-10-CM

## 2020-03-24 MED ORDER — LIRAGLUTIDE 0.6 MG/0.1 ML (18 MG/3 ML) SUBCUTANEOUS PEN INJECTOR
Freq: Every day | SUBCUTANEOUS | 5 refills | 30.00000 days | Status: CP
Start: 2020-03-24 — End: 2021-03-24
  Filled 2020-04-08: qty 9, 30d supply, fill #0

## 2020-03-24 MED ORDER — METFORMIN ER 500 MG TABLET,EXTENDED RELEASE 24 HR
ORAL_TABLET | Freq: Every day | ORAL | 1 refills | 30 days | Status: CP
Start: 2020-03-24 — End: ?
  Filled 2020-04-21: qty 120, 30d supply, fill #0

## 2020-04-16 DIAGNOSIS — M1990 Unspecified osteoarthritis, unspecified site: Principal | ICD-10-CM

## 2020-04-17 MED ORDER — CELECOXIB 200 MG CAPSULE
ORAL_CAPSULE | Freq: Two times a day (BID) | ORAL | 3 refills | 30 days | Status: CP
Start: 2020-04-17 — End: ?
  Filled 2020-04-21: qty 60, 30d supply, fill #0

## 2020-04-21 MED FILL — POTASSIUM CHLORIDE ER 10 MEQ TABLET,EXTENDED RELEASE: ORAL | 90 days supply | Qty: 90 | Fill #1

## 2020-04-21 MED FILL — PREMARIN 0.625 MG/GRAM VAGINAL CREAM: VAGINAL | 60 days supply | Qty: 30 | Fill #2

## 2020-04-21 MED FILL — PROAIR HFA 90 MCG/ACTUATION AEROSOL INHALER: RESPIRATORY_TRACT | 75 days supply | Qty: 25.5 | Fill #1

## 2020-04-21 MED FILL — HYDROCHLOROTHIAZIDE 25 MG TABLET: ORAL | 30 days supply | Qty: 30 | Fill #1

## 2020-05-05 MED FILL — PANTOPRAZOLE 40 MG TABLET,DELAYED RELEASE: ORAL | 45 days supply | Qty: 90 | Fill #1

## 2020-05-05 MED FILL — VICTOZA 3-PAK 0.6 MG/0.1 ML (18 MG/3 ML) SUBCUTANEOUS PEN INJECTOR: SUBCUTANEOUS | 30 days supply | Qty: 9 | Fill #1

## 2020-05-14 ENCOUNTER — Institutional Professional Consult (permissible substitution): Admit: 2020-05-14 | Discharge: 2020-05-15 | Payer: BLUE CROSS/BLUE SHIELD | Attending: Family | Primary: Family

## 2020-05-14 DIAGNOSIS — R635 Abnormal weight gain: Principal | ICD-10-CM

## 2020-05-14 DIAGNOSIS — I10 Essential (primary) hypertension: Principal | ICD-10-CM

## 2020-05-14 DIAGNOSIS — J4520 Mild intermittent asthma, uncomplicated: Principal | ICD-10-CM

## 2020-05-14 DIAGNOSIS — M1990 Unspecified osteoarthritis, unspecified site: Principal | ICD-10-CM

## 2020-05-14 DIAGNOSIS — E039 Hypothyroidism, unspecified: Principal | ICD-10-CM

## 2020-05-14 MED ORDER — LEVOTHYROXINE 50 MCG TABLET
ORAL_TABLET | Freq: Every day | ORAL | 3 refills | 90 days | Status: CP
Start: 2020-05-14 — End: ?
  Filled 2020-05-15: qty 90, 90d supply, fill #0

## 2020-05-14 MED ORDER — CELECOXIB 200 MG CAPSULE
ORAL_CAPSULE | Freq: Two times a day (BID) | ORAL | 3 refills | 90.00000 days | Status: CP
Start: 2020-05-14 — End: 2020-05-14
  Filled 2020-05-15: qty 180, 90d supply, fill #0

## 2020-05-14 MED ORDER — POTASSIUM CHLORIDE ER 10 MEQ TABLET,EXTENDED RELEASE
ORAL_TABLET | Freq: Every day | ORAL | 1 refills | 90.00000 days | Status: CP
Start: 2020-05-14 — End: 2020-05-14

## 2020-05-14 MED ORDER — SPIRONOLACTONE 25 MG TABLET
ORAL_TABLET | Freq: Every day | ORAL | 3 refills | 90 days | Status: CP
Start: 2020-05-14 — End: 2021-05-14
  Filled 2020-05-15: qty 90, 90d supply, fill #0

## 2020-05-14 MED ORDER — CONJUGATED ESTROGENS 0.3 MG TABLET
ORAL_TABLET | Freq: Every day | ORAL | 6 refills | 30.00000 days | Status: CP
Start: 2020-05-14 — End: 2021-05-14
  Filled 2020-05-15: qty 21, 28d supply, fill #0

## 2020-05-14 MED ORDER — FLUTICASONE PROPIONATE 50 MCG/ACTUATION NASAL SPRAY,SUSPENSION
Freq: Every day | NASAL | 3 refills | 34 days | Status: CP
Start: 2020-05-14 — End: ?
  Filled 2020-05-15: qty 16, 60d supply, fill #0

## 2020-05-14 MED ORDER — HYDROCHLOROTHIAZIDE 25 MG TABLET
ORAL_TABLET | Freq: Every morning | ORAL | 3 refills | 30 days | Status: CP
Start: 2020-05-14 — End: 2020-05-14

## 2020-05-15 MED FILL — METFORMIN ER 500 MG TABLET,EXTENDED RELEASE 24 HR: ORAL | 30 days supply | Qty: 120 | Fill #1

## 2020-05-29 ENCOUNTER — Institutional Professional Consult (permissible substitution): Admit: 2020-05-29 | Discharge: 2020-05-29 | Payer: BLUE CROSS/BLUE SHIELD

## 2020-05-29 ENCOUNTER — Ambulatory Visit: Admit: 2020-05-29 | Discharge: 2020-05-29 | Payer: BLUE CROSS/BLUE SHIELD

## 2020-05-29 DIAGNOSIS — E039 Hypothyroidism, unspecified: Principal | ICD-10-CM

## 2020-05-29 DIAGNOSIS — I10 Essential (primary) hypertension: Principal | ICD-10-CM

## 2020-05-29 DIAGNOSIS — R635 Abnormal weight gain: Principal | ICD-10-CM

## 2020-05-29 DIAGNOSIS — N951 Menopausal and female climacteric states: Principal | ICD-10-CM

## 2020-06-02 ENCOUNTER — Other Ambulatory Visit: Admit: 2020-06-02 | Discharge: 2020-06-03 | Payer: BLUE CROSS/BLUE SHIELD

## 2020-06-02 DIAGNOSIS — E039 Hypothyroidism, unspecified: Principal | ICD-10-CM

## 2020-06-03 MED FILL — BD ULTRA-FINE NANO PEN NEEDLE 32 GAUGE X 5/32" (4 MM): 34 days supply | Qty: 100 | Fill #2

## 2020-06-03 MED FILL — VICTOZA 3-PAK 0.6 MG/0.1 ML (18 MG/3 ML) SUBCUTANEOUS PEN INJECTOR: SUBCUTANEOUS | 30 days supply | Qty: 9 | Fill #2

## 2020-06-05 ENCOUNTER — Institutional Professional Consult (permissible substitution): Admit: 2020-06-05 | Discharge: 2020-06-06 | Payer: BLUE CROSS/BLUE SHIELD | Attending: Family | Primary: Family

## 2020-06-09 DIAGNOSIS — R635 Abnormal weight gain: Principal | ICD-10-CM

## 2020-06-09 DIAGNOSIS — Z6841 Body Mass Index (BMI) 40.0 and over, adult: Principal | ICD-10-CM

## 2020-06-09 MED ORDER — PREMARIN 0.625 MG/GRAM VAGINAL CREAM
Freq: Every day | VAGINAL | 11 refills | 60 days | Status: CP
Start: 2020-06-09 — End: 2021-06-09
  Filled 2020-06-10: qty 30, 60d supply, fill #0

## 2020-06-10 MED FILL — PREMARIN 0.3 MG TABLET: ORAL | 28 days supply | Qty: 21 | Fill #1

## 2020-06-11 DIAGNOSIS — R635 Abnormal weight gain: Principal | ICD-10-CM

## 2020-06-11 DIAGNOSIS — Z6841 Body Mass Index (BMI) 40.0 and over, adult: Principal | ICD-10-CM

## 2020-06-11 MED ORDER — METFORMIN ER 500 MG TABLET,EXTENDED RELEASE 24 HR
ORAL_TABLET | Freq: Every day | ORAL | 1 refills | 30.00000 days | Status: CP
Start: 2020-06-11 — End: ?
  Filled 2020-06-12: qty 120, 30d supply, fill #0

## 2020-06-12 ENCOUNTER — Ambulatory Visit: Admit: 2020-06-12 | Payer: BLUE CROSS/BLUE SHIELD

## 2020-06-16 ENCOUNTER — Ambulatory Visit
Admit: 2020-06-16 | Discharge: 2020-06-17 | Payer: BLUE CROSS/BLUE SHIELD | Attending: Obesity Medicine | Primary: Obesity Medicine

## 2020-06-16 MED ORDER — SEMAGLUTIDE 0.25 MG OR 0.5 MG (2 MG/1.5 ML) SUBCUTANEOUS PEN INJECTOR
SUBCUTANEOUS | 3 refills | 28 days | Status: CP
Start: 2020-06-16 — End: 2020-07-14
  Filled 2020-06-23: qty 1.5, 28d supply, fill #0

## 2020-07-04 ENCOUNTER — Ambulatory Visit: Admit: 2020-07-04 | Discharge: 2020-07-05 | Payer: BLUE CROSS/BLUE SHIELD

## 2020-07-11 MED FILL — PREMARIN 0.3 MG TABLET: ORAL | 28 days supply | Qty: 21 | Fill #2

## 2020-07-22 MED FILL — METFORMIN ER 500 MG TABLET,EXTENDED RELEASE 24 HR: ORAL | 30 days supply | Qty: 120 | Fill #1

## 2020-07-22 MED FILL — OZEMPIC 0.25 MG OR 0.5 MG (2 MG/1.5 ML) SUBCUTANEOUS PEN INJECTOR: SUBCUTANEOUS | 28 days supply | Qty: 1.5 | Fill #1

## 2020-08-08 MED FILL — SPIRONOLACTONE 25 MG TABLET: ORAL | 90 days supply | Qty: 90 | Fill #1

## 2020-08-08 MED FILL — SYNTHROID 50 MCG TABLET: ORAL | 90 days supply | Qty: 90 | Fill #1

## 2020-08-08 MED FILL — CELECOXIB 200 MG CAPSULE: ORAL | 90 days supply | Qty: 180 | Fill #1

## 2020-08-08 MED FILL — PREMARIN 0.3 MG TABLET: ORAL | 28 days supply | Qty: 21 | Fill #3

## 2020-08-08 MED FILL — PREMARIN 0.625 MG/GRAM VAGINAL CREAM: VAGINAL | 60 days supply | Qty: 30 | Fill #1

## 2020-08-11 MED FILL — OZEMPIC 0.25 MG OR 0.5 MG (2 MG/1.5 ML) SUBCUTANEOUS PEN INJECTOR: SUBCUTANEOUS | 28 days supply | Qty: 1.5 | Fill #2

## 2020-08-20 DIAGNOSIS — R635 Abnormal weight gain: Principal | ICD-10-CM

## 2020-08-20 DIAGNOSIS — Z6841 Body Mass Index (BMI) 40.0 and over, adult: Principal | ICD-10-CM

## 2020-08-20 DIAGNOSIS — K219 Gastro-esophageal reflux disease without esophagitis: Principal | ICD-10-CM

## 2020-08-20 MED ORDER — PANTOPRAZOLE 40 MG TABLET,DELAYED RELEASE
ORAL_TABLET | Freq: Two times a day (BID) | ORAL | 0 refills | 45 days | Status: CP
Start: 2020-08-20 — End: 2021-08-20
  Filled 2020-08-21: qty 90, 45d supply, fill #0

## 2020-08-20 MED ORDER — METFORMIN ER 500 MG TABLET,EXTENDED RELEASE 24 HR
ORAL_TABLET | Freq: Every day | ORAL | 1 refills | 30 days | Status: CP
Start: 2020-08-20 — End: ?
  Filled 2020-08-21: qty 120, 30d supply, fill #0

## 2020-08-21 MED FILL — FLUTICASONE PROPIONATE 50 MCG/ACTUATION NASAL SPRAY,SUSPENSION: NASAL | 60 days supply | Qty: 16 | Fill #1

## 2020-08-29 ENCOUNTER — Other Ambulatory Visit: Admit: 2020-08-29 | Discharge: 2020-08-30 | Payer: BLUE CROSS/BLUE SHIELD

## 2020-08-29 DIAGNOSIS — E7800 Pure hypercholesterolemia, unspecified: Principal | ICD-10-CM

## 2020-08-29 DIAGNOSIS — I10 Essential (primary) hypertension: Principal | ICD-10-CM

## 2020-08-29 DIAGNOSIS — Z79899 Other long term (current) drug therapy: Principal | ICD-10-CM

## 2020-09-05 ENCOUNTER — Ambulatory Visit: Admit: 2020-09-05 | Discharge: 2020-09-06 | Payer: BLUE CROSS/BLUE SHIELD | Attending: Family | Primary: Family

## 2020-09-05 DIAGNOSIS — L659 Nonscarring hair loss, unspecified: Principal | ICD-10-CM

## 2020-09-05 DIAGNOSIS — N951 Menopausal and female climacteric states: Principal | ICD-10-CM

## 2020-09-05 DIAGNOSIS — K219 Gastro-esophageal reflux disease without esophagitis: Principal | ICD-10-CM

## 2020-09-05 DIAGNOSIS — M1990 Unspecified osteoarthritis, unspecified site: Principal | ICD-10-CM

## 2020-09-05 DIAGNOSIS — J4520 Mild intermittent asthma, uncomplicated: Principal | ICD-10-CM

## 2020-09-05 DIAGNOSIS — J0100 Acute maxillary sinusitis, unspecified: Principal | ICD-10-CM

## 2020-09-05 MED ORDER — CONJUGATED ESTROGENS 0.3 MG TABLET
ORAL_TABLET | Freq: Every day | ORAL | 1 refills | 90 days | Status: CP
Start: 2020-09-05 — End: 2021-09-05
  Filled 2020-09-15: qty 63, 84d supply, fill #0

## 2020-09-05 MED ORDER — PRAVASTATIN 40 MG TABLET
ORAL_TABLET | Freq: Every evening | ORAL | 1 refills | 90 days | Status: CP
Start: 2020-09-05 — End: 2021-09-05
  Filled 2020-11-19: qty 90, 90d supply, fill #0

## 2020-09-05 MED ORDER — PREMARIN 0.625 MG/GRAM VAGINAL CREAM
Freq: Every day | VAGINAL | 11 refills | 60 days | Status: CP
Start: 2020-09-05 — End: 2021-09-05

## 2020-09-05 MED ORDER — PANTOPRAZOLE 40 MG TABLET,DELAYED RELEASE
ORAL_TABLET | Freq: Two times a day (BID) | ORAL | 0 refills | 45 days | Status: CP
Start: 2020-09-05 — End: 2021-09-05
  Filled 2020-09-24: qty 90, 45d supply, fill #0

## 2020-09-05 MED ORDER — LOSARTAN 25 MG TABLET
ORAL_TABLET | Freq: Every day | ORAL | 1 refills | 90 days | Status: CP
Start: 2020-09-05 — End: 2021-09-05
  Filled 2020-09-15: qty 90, 90d supply, fill #0

## 2020-09-05 MED ORDER — ALBUTEROL SULFATE HFA 90 MCG/ACTUATION AEROSOL INHALER
Freq: Four times a day (QID) | RESPIRATORY_TRACT | 1 refills | 75 days | Status: CP | PRN
Start: 2020-09-05 — End: 2021-09-05

## 2020-09-05 MED ORDER — CELECOXIB 200 MG CAPSULE
ORAL_CAPSULE | Freq: Two times a day (BID) | ORAL | 3 refills | 90 days | Status: CP
Start: 2020-09-05 — End: ?

## 2020-09-05 MED ORDER — AMOXICILLIN 875 MG TABLET
ORAL_TABLET | Freq: Two times a day (BID) | ORAL | 0 refills | 10 days | Status: CP
Start: 2020-09-05 — End: 2020-09-15

## 2020-09-15 MED FILL — METFORMIN ER 500 MG TABLET,EXTENDED RELEASE 24 HR: ORAL | 30 days supply | Qty: 120 | Fill #1

## 2020-09-15 MED FILL — OZEMPIC 0.25 MG OR 0.5 MG (2 MG/1.5 ML) SUBCUTANEOUS PEN INJECTOR: SUBCUTANEOUS | 28 days supply | Qty: 1.5 | Fill #3

## 2020-09-25 ENCOUNTER — Ambulatory Visit
Admit: 2020-09-25 | Discharge: 2020-09-26 | Payer: BLUE CROSS/BLUE SHIELD | Attending: Obesity Medicine | Primary: Obesity Medicine

## 2020-09-25 DIAGNOSIS — Z6841 Body Mass Index (BMI) 40.0 and over, adult: Principal | ICD-10-CM

## 2020-09-25 DIAGNOSIS — E039 Hypothyroidism, unspecified: Principal | ICD-10-CM

## 2020-09-25 DIAGNOSIS — R7303 Prediabetes: Principal | ICD-10-CM

## 2020-09-25 DIAGNOSIS — R7989 Other specified abnormal findings of blood chemistry: Principal | ICD-10-CM

## 2020-09-25 MED ORDER — SEMAGLUTIDE 1 MG/DOSE (4 MG/3 ML) SUBCUTANEOUS PEN INJECTOR
SUBCUTANEOUS | 3 refills | 28.00000 days | Status: CP
Start: 2020-09-25 — End: 2020-09-25
  Filled 2020-09-30: qty 3, 28d supply, fill #0

## 2020-10-15 DIAGNOSIS — R635 Abnormal weight gain: Principal | ICD-10-CM

## 2020-10-15 DIAGNOSIS — Z6841 Body Mass Index (BMI) 40.0 and over, adult: Principal | ICD-10-CM

## 2020-10-16 MED ORDER — METFORMIN ER 500 MG TABLET,EXTENDED RELEASE 24 HR
ORAL_TABLET | Freq: Every day | ORAL | 1 refills | 30.00000 days | Status: CP
Start: 2020-10-16 — End: ?
  Filled 2020-10-16: qty 120, 30d supply, fill #0

## 2020-10-22 ENCOUNTER — Ambulatory Visit
Admit: 2020-10-22 | Discharge: 2020-10-23 | Payer: BLUE CROSS/BLUE SHIELD | Attending: Student in an Organized Health Care Education/Training Program | Primary: Student in an Organized Health Care Education/Training Program

## 2020-10-22 DIAGNOSIS — L658 Other specified nonscarring hair loss: Principal | ICD-10-CM

## 2020-10-22 DIAGNOSIS — L720 Epidermal cyst: Principal | ICD-10-CM

## 2020-10-22 DIAGNOSIS — D179 Benign lipomatous neoplasm, unspecified: Principal | ICD-10-CM

## 2020-10-22 DIAGNOSIS — N951 Menopausal and female climacteric states: Principal | ICD-10-CM

## 2020-10-22 MED ORDER — SPIRONOLACTONE 100 MG TABLET
ORAL_TABLET | Freq: Every day | ORAL | 0 refills | 180.00000 days | Status: CP
Start: 2020-10-22 — End: 2021-04-20
  Filled 2020-10-23: qty 90, 90d supply, fill #0

## 2020-10-23 MED FILL — PREMARIN 0.625 MG/GRAM VAGINAL CREAM: VAGINAL | 60 days supply | Qty: 30 | Fill #0

## 2020-10-23 MED FILL — OZEMPIC 1 MG/DOSE (4 MG/3 ML) SUBCUTANEOUS PEN INJECTOR: SUBCUTANEOUS | 28 days supply | Qty: 3 | Fill #1

## 2020-10-30 ENCOUNTER — Ambulatory Visit: Admit: 2020-10-30 | Payer: BLUE CROSS/BLUE SHIELD

## 2020-10-31 ENCOUNTER — Ambulatory Visit: Payer: Self-pay | Admitting: Surgery

## 2020-11-07 ENCOUNTER — Ambulatory Visit (INDEPENDENT_AMBULATORY_CARE_PROVIDER_SITE_OTHER): Payer: Medicaid Other | Admitting: Surgery

## 2020-11-07 ENCOUNTER — Other Ambulatory Visit: Payer: Self-pay

## 2020-11-07 ENCOUNTER — Encounter: Payer: Self-pay | Admitting: Surgery

## 2020-11-07 VITALS — BP 119/82 | HR 77 | Temp 98.3°F | Ht 60.0 in | Wt 238.0 lb

## 2020-11-07 DIAGNOSIS — D1720 Benign lipomatous neoplasm of skin and subcutaneous tissue of unspecified limb: Secondary | ICD-10-CM

## 2020-11-07 NOTE — H&P (View-Only) (Signed)
11/07/2020  Reason for Visit:  Bilateral forearm lipomas  Referring Provider:  Lonia Blood, MD  History of Present Illness: Katie Ryan is a 50 y.o. 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  She reports that she has had this for a few years now, approximately 4 years.  She has a history of a right ankle fracture and she had a lot of weight gain following that.  She feels that with the weight gain, this lipoma started showing up and now particularly one of the lipomas in her left forearm is very sensitive when touched or pushed on.  Overall the left arm appears to be more sensitive compared to the right but she can feel them more when she is driving or when her arm is leaning on something.  She denies any redness of the overlying skin on either arm, drainage, or induration.  She recently saw Dr. Ida Rogue with dermatology for hair thinning and hair loss, the lipomas, and a left eye milium.  Due to the location of the lipomas, should refer her to [place].  Past Medical History: Past Medical History:  Diagnosis Date   Back pain      Past Surgical History: Past Surgical History:  Procedure Laterality Date   BACK SURGERY      Home Medications: Prior to Admission medications   Medication Sig Start Date End Date Taking? Authorizing Provider  albuterol (VENTOLIN HFA) 108 (90 Base) MCG/ACT inhaler Inhale into the lungs. 10/08/13 09/05/21 Yes [provider]  celecoxib (CELEBREX) 200 MG capsule Take by mouth 2 (two) times daily. 08/08/20  Yes [provider]  conjugated estrogens (PREMARIN) vaginal cream Place vaginally. 04/23/16 09/05/21 Yes [provider]  Eflornithine HCl (VANIQA) 13.9 % cream Apply at night as directed 10/10/13  Yes [provider]  fluticasone (FLONASE) 50 MCG/ACT nasal spray Place 1 spray into both nostrils daily. 10/08/13  Yes [provider]  levothyroxine (SYNTHROID) 50 MCG tablet Take 1 tablet by mouth daily.  05/14/20  Yes [provider]  losartan (COZAAR) 25 MG tablet Take 25 mg by mouth daily. 09/15/20  Yes [provider]  metFORMIN (GLUCOPHAGE-XR) 500 MG 24 hr tablet Take by mouth. 06/14/19  Yes [provider]  pantoprazole (PROTONIX) 40 MG tablet Take by mouth. 10/10/17 09/05/21 Yes [provider]  pravastatin (PRAVACHOL) 40 MG tablet Take 40 mg by mouth daily as needed. 08/10/20  Yes [provider]  Semaglutide, 1 MG/DOSE, (OZEMPIC, 1 MG/DOSE,) 4 MG/3ML SOPN Inject into the skin. 09/25/20  Yes [provider]  spironolactone (ALDACTONE) 100 MG tablet Take 100 mg by mouth daily. 10/22/20  Yes [provider]  traMADol (ULTRAM) 50 MG tablet Take by mouth. 11/14/19  Yes [provider]  traZODone (DESYREL) 50 MG tablet Take 50-150 mg by mouth at bedtime. 08/10/20  Yes [provider]    Allergies: Allergies  Allergen Reactions   Iohexol      Desc: IVP DYE   OK WITH 13 HR PREP    Shellfish Allergy    Sulfa Antibiotics    Topamax [Topiramate]     Social History:  reports that she has never smoked. She has never used smokeless tobacco. She reports that she does not drink alcohol and does not use drugs.   Family History: No family history on file.  Review of Systems: Review of Systems  Constitutional:  Negative for chills and fever.  HENT:  Negative for hearing loss.   Respiratory:  Negative  for shortness of breath.   Cardiovascular:  Negative for chest pain.  Gastrointestinal:  Negative for abdominal pain, nausea and vomiting.  Genitourinary:  Negative for dysuria.  Musculoskeletal:  Positive for back pain.  Skin:  Negative for rash.       Bilateral forearm lipomas  Neurological:  Negative for dizziness.  Psychiatric/Behavioral:  Negative for depression.    Physical Exam BP 119/82   Pulse 77   Temp 98.3 F (36.8 C)   Ht 5' (1.524 m)   Wt 238 lb (108 kg)   LMP 01/30/2014   SpO2 98%   BMI 46.48 kg/m   CONSTITUTIONAL: No acute distress HEENT:  Normocephalic, atraumatic, extraocular motion intact. NECK: Trachea is midline, and there is no jugular venous distension.  RESPIRATORY:  Lungs are clear, and breath sounds are equal bilaterally. Normal respiratory effort without pathologic use of accessory muscles. CARDIOVASCULAR: Heart is regular without murmurs, gallops, or rubs. GI: The abdomen is soft, nondistended.  MUSCULOSKELETAL:  Normal muscle strength and tone in all four extremities.  No peripheral edema or cyanosis. SKIN: The patient has 2 lipomas on the left posterior forearm in the mid to distal region which measure approximately 1.5 x 4 cm each oriented in a diagonal orientation.  These are soft, somewhat mobile, with no evidence of infection.  On the right proximal forearm, just below the AC fossa, the patient has a 1.5 x 3 cm lipoma between the cephalic and basilic veins.  This is also soft, somewhat mobile, with no evidence of infection. NEUROLOGIC:  Motor and sensation is grossly normal.  Cranial nerves are grossly intact. PSYCH:  Alert and oriented to person, place and time. Affect is normal.  Laboratory Analysis: Labs from 08/29/2020: Sodium 139, potassium 3.9, chloride 109, CO2 24, BUN 12, creatinine 0.54.  Total bilirubin 1.5, AST 30, ALT 44, alkaline phosphatase 70.  WBC 7.7, hemoglobin 13.5, hematocrit 38.4, platelets 295.  Imaging: No results found.  Assessment and Plan: This is a 50 y.o. female with bilateral forearm lipomas.  - Discussed with her more about what lipomas are and that these are benign masses.  With that said, some lipomas can be symptomatic and in her case the left forearm particular is more sensitive and tender when pushing or touching against something.  She would like this excised.  I think based on the location, it would be more prudent to do this in the operating room we can have better control of the surgical field.  She is in agreement.  Reviewed with  her the planned surgery of excision of bilateral forearm lipomas.  Discussed with her the risks of bleeding, infection, injury to surrounding structures, well as postop recovery, that this is same-day surgery, and pain control afterwards.  She is willing to proceed. - We will schedule her for 11/14/2020.  Face-to-face time spent with the patient and care providers was 60 minutes, with more than 50% of the time spent counseling, educating, and coordinating care of the patient.     Melvyn Neth, [facility]

## 2020-11-07 NOTE — Patient Instructions (Addendum)
We have spoken to you today about having your lipomas on your arms removed. This will be done at [facility] by Dr Hampton Abbot. We are looking at Friday August 26th.  Please see your Blue surgery sheet for information. Our surgery scheduler will call you to look at surgery dates and to go over information.   Lipoma Removal  Lipoma removal is a surgical procedure to remove a lipoma, which is a noncancerous (benign) tumor that is made up of fat cells. Most lipomas are small and painless and do not require treatment. They can form in many areas of the body but are mostcommon under the skin of the back, arms, shoulders, buttocks, and thighs. You may need lipoma removal if you have a lipoma that is large, growing, orcausing discomfort. Lipoma removal may also be done for cosmetic reasons. Tell a health care provider about: Any allergies you have. All medicines you are taking, including vitamins, herbs, eye drops, creams, and over-the-counter medicines. Any problems you or family members have had with anesthetic medicines. Any blood disorders you have. Any surgeries you have had. Any medical conditions you have. Whether you are pregnant or may be pregnant. What are the risks? Generally, this is a safe procedure. However, problems may occur, including: Infection. Bleeding. Scarring. Allergic reactions to medicines. Damage to nearby structures or organs, such as damage to nerves or blood vessels near the lipoma. What happens before the procedure? Staying hydrated Follow instructions from your health care provider about hydration, which may include: Up to 2 hours before the procedure - you may continue to drink clear liquids, such as water, clear fruit juice, black coffee, and plain tea. Medicines Ask your health care provider about: Changing or stopping your regular medicines. This is especially important if you are taking diabetes medicines or blood thinners. Taking medicines such as aspirin and  ibuprofen. These medicines can thin your blood. Do not take these medicines unless your health care provider tells you to take them. Taking over-the-counter medicines, vitamins, herbs, and supplements. General instructions You will have a physical exam. Your health care provider will check the size of the lipoma and whether it can be moved easily. You may have a biopsy and imaging tests, such as X-rays, a CT scan, and an MRI. Do not use any products that contain nicotine or tobacco for at least 4 weeks before the procedure. These products include cigarettes, e-cigarettes, and chewing tobacco. If you need help quitting, ask your health care provider. Ask your health care provider: How your surgery site will be marked. What steps will be taken to help prevent infection. These may include: Washing skin with a germ-killing soap. Taking antibiotic medicine. Plan to have someone take you home from the hospital or clinic. If you will be going home right after the procedure, plan to have someone with you for 24 hours. What happens during the procedure?  An IV will be inserted into one of your veins. You will be given one or more of the following: A medicine to help you relax (sedative). A medicine to numb the area (local anesthetic). A medicine to make you fall asleep (general anesthetic). A medicine that is injected into an area of your body to numb everything below the injection site (regional anesthetic). An incision will be made over the lipoma or very near the lipoma. The incision may be made in a natural skin line or crease. Tissues, nerves, and blood vessels near the lipoma will be moved out of the way. The  lipoma and the capsule that surrounds it will be separated from the surrounding tissues. The lipoma will be removed. The incision may be closed with stitches (sutures). A bandage (dressing) will be placed over the incision. The procedure may vary among health care providers and  hospitals. What happens after the procedure? Your blood pressure, heart rate, breathing rate, and blood oxygen level will be monitored until you leave the hospital or clinic. If you were prescribed an antibiotic medicine, use it as told by your health care provider. Do not stop using the antibiotic even if you start to feel better. If you were given a sedative during the procedure, it can affect you for several hours. Do not drive or operate machinery until your health care provider says that it is safe. Summary Before the procedure, follow instructions from your health care provider about eating and drinking, and changing or stopping your regular medicines. This is especially important if you are taking diabetes medicines or blood thinners. After the lipoma is removed, the incision may be closed with stitches (sutures) and covered with a bandage (dressing). If you were given a sedative during the procedure, it can affect you for several hours. Do not drive or operate machinery until your health care provider says that it is safe. This information is not intended to replace advice given to you by your health care provider. Make sure you discuss any questions you have with your healthcare provider. Document Revised: 10/23/2018 Document Reviewed: 10/23/2018 Elsevier Patient Education  [employer].

## 2020-11-07 NOTE — Progress Notes (Signed)
11/07/2020  Reason for Visit:  Bilateral forearm lipomas  Referring Provider:  Lonia Blood, MD  History of Present Illness: Katie Ryan is a 50 y.o. 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  She reports that she has had this for a few years now, approximately 4 years.  She has a history of a right ankle fracture and she had a lot of weight gain following that.  She feels that with the weight gain, this lipoma started showing up and now particularly one of the lipomas in her left forearm is very sensitive when touched or pushed on.  Overall the left arm appears to be more sensitive compared to the right but she can feel them more when she is driving or when her arm is leaning on something.  She denies any redness of the overlying skin on either arm, drainage, or induration.  She recently saw Dr. Ida Rogue with dermatology for hair thinning and hair loss, the lipomas, and a left eye milium.  Due to the location of the lipomas, should refer her to [place].  Past Medical History: Past Medical History:  Diagnosis Date   Back pain      Past Surgical History: Past Surgical History:  Procedure Laterality Date   BACK SURGERY      Home Medications: Prior to Admission medications   Medication Sig Start Date End Date Taking? Authorizing Provider  albuterol (VENTOLIN HFA) 108 (90 Base) MCG/ACT inhaler Inhale into the lungs. 10/08/13 09/05/21 Yes [provider]  celecoxib (CELEBREX) 200 MG capsule Take by mouth 2 (two) times daily. 08/08/20  Yes [provider]  conjugated estrogens (PREMARIN) vaginal cream Place vaginally. 04/23/16 09/05/21 Yes [provider]  Eflornithine HCl (VANIQA) 13.9 % cream Apply at night as directed 10/10/13  Yes [provider]  fluticasone (FLONASE) 50 MCG/ACT nasal spray Place 1 spray into both nostrils daily. 10/08/13  Yes [provider]  levothyroxine (SYNTHROID) 50 MCG tablet Take 1 tablet by mouth daily.  05/14/20  Yes [provider]  losartan (COZAAR) 25 MG tablet Take 25 mg by mouth daily. 09/15/20  Yes [provider]  metFORMIN (GLUCOPHAGE-XR) 500 MG 24 hr tablet Take by mouth. 06/14/19  Yes [provider]  pantoprazole (PROTONIX) 40 MG tablet Take by mouth. 10/10/17 09/05/21 Yes [provider]  pravastatin (PRAVACHOL) 40 MG tablet Take 40 mg by mouth daily as needed. 08/10/20  Yes [provider]  Semaglutide, 1 MG/DOSE, (OZEMPIC, 1 MG/DOSE,) 4 MG/3ML SOPN Inject into the skin. 09/25/20  Yes [provider]  spironolactone (ALDACTONE) 100 MG tablet Take 100 mg by mouth daily. 10/22/20  Yes [provider]  traMADol (ULTRAM) 50 MG tablet Take by mouth. 11/14/19  Yes [provider]  traZODone (DESYREL) 50 MG tablet Take 50-150 mg by mouth at bedtime. 08/10/20  Yes [provider]    Allergies: Allergies  Allergen Reactions   Iohexol      Desc: IVP DYE   OK WITH 13 HR PREP    Shellfish Allergy    Sulfa Antibiotics    Topamax [Topiramate]     Social History:  reports that she has never smoked. She has never used smokeless tobacco. She reports that she does not drink alcohol and does not use drugs.   Family History: No family history on file.  Review of Systems: Review of Systems  Constitutional:  Negative for chills and fever.  HENT:  Negative for hearing loss.   Respiratory:  Negative  for shortness of breath.   Cardiovascular:  Negative for chest pain.  Gastrointestinal:  Negative for abdominal pain, nausea and vomiting.  Genitourinary:  Negative for dysuria.  Musculoskeletal:  Positive for back pain.  Skin:  Negative for rash.       Bilateral forearm lipomas  Neurological:  Negative for dizziness.  Psychiatric/Behavioral:  Negative for depression.    Physical Exam BP 119/82   Pulse 77   Temp 98.3 F (36.8 C)   Ht 5' (1.524 m)   Wt 238 lb (108 kg)   LMP 01/30/2014   SpO2 98%   BMI 46.48 kg/m   CONSTITUTIONAL: No acute distress HEENT:  Normocephalic, atraumatic, extraocular motion intact. NECK: Trachea is midline, and there is no jugular venous distension.  RESPIRATORY:  Lungs are clear, and breath sounds are equal bilaterally. Normal respiratory effort without pathologic use of accessory muscles. CARDIOVASCULAR: Heart is regular without murmurs, gallops, or rubs. GI: The abdomen is soft, nondistended.  MUSCULOSKELETAL:  Normal muscle strength and tone in all four extremities.  No peripheral edema or cyanosis. SKIN: The patient has 2 lipomas on the left posterior forearm in the mid to distal region which measure approximately 1.5 x 4 cm each oriented in a diagonal orientation.  These are soft, somewhat mobile, with no evidence of infection.  On the right proximal forearm, just below the AC fossa, the patient has a 1.5 x 3 cm lipoma between the cephalic and basilic veins.  This is also soft, somewhat mobile, with no evidence of infection. NEUROLOGIC:  Motor and sensation is grossly normal.  Cranial nerves are grossly intact. PSYCH:  Alert and oriented to person, place and time. Affect is normal.  Laboratory Analysis: Labs from 08/29/2020: Sodium 139, potassium 3.9, chloride 109, CO2 24, BUN 12, creatinine 0.54.  Total bilirubin 1.5, AST 30, ALT 44, alkaline phosphatase 70.  WBC 7.7, hemoglobin 13.5, hematocrit 38.4, platelets 295.  Imaging: No results found.  Assessment and Plan: This is a 50 y.o. female with bilateral forearm lipomas.  - Discussed with her more about what lipomas are and that these are benign masses.  With that said, some lipomas can be symptomatic and in her case the left forearm particular is more sensitive and tender when pushing or touching against something.  She would like this excised.  I think based on the location, it would be more prudent to do this in the operating room we can have better control of the surgical field.  She is in agreement.  Reviewed with  her the planned surgery of excision of bilateral forearm lipomas.  Discussed with her the risks of bleeding, infection, injury to surrounding structures, well as postop recovery, that this is same-day surgery, and pain control afterwards.  She is willing to proceed. - We will schedule her for 11/14/2020.  Face-to-face time spent with the patient and care providers was 60 minutes, with more than 50% of the time spent counseling, educating, and coordinating care of the patient.     Melvyn Neth, [facility]

## 2020-11-10 ENCOUNTER — Telehealth: Payer: Self-pay | Admitting: Surgery

## 2020-11-10 NOTE — Telephone Encounter (Signed)
Outgoing call is made to both numbers, unable to leave voice mail, mailbox full.  Please advise patient of the following:   Pre-Admission date/time, COVID Testing date and Surgery date.  Surgery Date: 11/14/20  Preadmission Testing Date: 11/12/20 (phone 8a-1p) Covid Testing Date: Not needed.     Also patient needs to call at [phone], between 1-3:00pm the day before surgery, to find out what time to arrive for surgery.

## 2020-11-11 NOTE — Telephone Encounter (Signed)
All numbers in chart have been called numerous times.  There is no personal contact, the person who was in there when called her said she no longer has contact with the patient.  She further states the last telephone number she had is now disconnected and there is no further contact.  Numerous attempts made, but to no avail.

## 2020-11-12 ENCOUNTER — Encounter
Admission: RE | Admit: 2020-11-12 | Discharge: 2020-11-12 | Disposition: A | Discharge status: Home / Self Care | Payer: Medicaid Other | Source: Ambulatory Visit | Attending: Surgery | Admitting: Surgery

## 2020-11-12 ENCOUNTER — Telehealth: Payer: Self-pay | Admitting: Surgery

## 2020-11-12 HISTORY — DX: Hypothyroidism, unspecified: E03.9

## 2020-11-12 HISTORY — DX: Personal history of urinary calculi: Z87.442

## 2020-11-12 HISTORY — DX: Hyperlipidemia, unspecified: E78.5

## 2020-11-12 HISTORY — DX: Prediabetes: R73.03

## 2020-11-12 HISTORY — DX: Essential (primary) hypertension: I10

## 2020-11-12 HISTORY — DX: Unspecified asthma, uncomplicated: J45.909

## 2020-11-12 HISTORY — DX: Morbid (severe) obesity due to excess calories: E66.01

## 2020-11-12 NOTE — Pre-Procedure Instructions (Signed)
Attempted to do preop interview. No answer from either of the 2 numbers listed. Call to other relatives that is on her contact list. Relatives have not talked with her for awhile and do not have a working number for the patient. Patient will have to arrive 2 hours early on day of surgery to complete interview, required labs/EKG. Dr. Hampton Abbot office made aware of this and stated that they were having the same issue with patient not answering phone and unable to leave messages.

## 2020-11-12 NOTE — Patient Instructions (Signed)
Your procedure is scheduled on: Friday, August 26 Report to the Registration Desk on the 1st floor of the [facility]. To find out your arrival time, please call [phone] between 1PM - 3PM on: Thursday, August 25  REMEMBER: Instructions that are not followed completely may result in serious medical risk, up to and including death; or upon the discretion of your surgeon and anesthesiologist your surgery may need to be rescheduled.  Do not eat food after midnight the night before surgery.  No gum chewing, lozengers or hard candies.  You may however, drink CLEAR liquids up to 2 hours before you are scheduled to arrive for your surgery. Do not drink anything within 2 hours of your scheduled arrival time.  Clear liquids include: - water  - apple juice without pulp - gatorade (not RED, PURPLE, OR BLUE) - black coffee or tea (Do NOT add milk or creamers to the coffee or tea) Do NOT drink anything that is not on this list.  TAKE THESE MEDICATIONS THE MORNING OF SURGERY WITH A SIP OF WATER:  Albuterol inhaler Levothyroxine Pantoprazole (Protonix) - (take one the night before and one on the morning of surgery - helps to prevent nausea after surgery.)  Use inhalers on the day of surgery and bring to the hospital.  Stop Metformin 2 days prior to surgery. Last day to take metformin is august 23. Resume AFTER surgery.  Take 1/2 of usual insulin dose the night before surgery and none on the morning of surgery.  One week prior to surgery: Stop Anti-inflammatories (NSAIDS) such as Advil, Aleve, Ibuprofen, Motrin, Naproxen, Naprosyn and Aspirin based products such as Excedrin, Goodys Powder, BC Powder. Stop ANY OVER THE COUNTER supplements until after surgery. You may however, continue to take Tylenol if needed for pain up until the day of surgery.  No Alcohol for 24 hours before or after surgery.  No Smoking including e-cigarettes for 24 hours prior to surgery.  No chewable tobacco  products for at least 6 hours prior to surgery.  No nicotine patches on the day of surgery.  Do not use any "recreational" drugs for at least a week prior to your surgery.  Please be advised that the combination of cocaine and anesthesia may have negative outcomes, up to and including death. If you test positive for cocaine, your surgery will be cancelled.  On the morning of surgery brush your teeth with toothpaste and water, you may rinse your mouth with mouthwash if you wish. Do not swallow any toothpaste or mouthwash.  Do not wear jewelry, make-up, hairpins, clips or nail polish.  Do not wear lotions, powders, or perfumes.   Do not shave body from the neck down 48 hours prior to surgery just in case you cut yourself which could leave a site for infection.  Also, freshly shaved skin may become irritated if using the CHG soap.  Contact lenses, hearing aids and dentures may not be worn into surgery.  Do not bring valuables to the hospital. [facility] is not responsible for any missing/lost belongings or valuables.   Use CHG Soap as directed on instruction sheet.  Bring your C-PAP to the hospital with you in case you may have to spend the night.   Notify your doctor if there is any change in your medical condition (cold, fever, infection).  Wear comfortable clothing (specific to your surgery type) to the hospital.  After surgery, you can help prevent lung complications by doing breathing exercises.  Take deep breaths  and cough every 1-2 hours. Your doctor may order a device called an Incentive Spirometer to help you take deep breaths.  If you are being discharged the day of surgery, you will not be allowed to drive home. You will need a responsible adult (18 years or older) to drive you home and stay with you that night.   If you are taking public transportation, you will need to have a responsible adult (18 years or older) with you. Please confirm with your physician that it is  acceptable to use public transportation.   Please call the [facility] at [phone] if you have any questions about these instructions.  Surgery Visitation Policy:  Patients undergoing a surgery or procedure may have one family member or support person with them as long as that person is not COVID-19 positive or experiencing its symptoms.  That person may remain in the waiting area during the procedure.

## 2020-11-12 NOTE — Telephone Encounter (Signed)
Hey Dr. Hampton Abbot.  Just wanted to give you heads up on this patient scheduled for surgery with you on Friday August 26th.  Since case has been posted I have not been able to get in touch with this patient at all about her surgery.  Pre admissions also has been trying to call.  We have called all available numbers in her chart, but to no avail.  Thank you.

## 2020-11-12 NOTE — Telephone Encounter (Signed)
Pre admissions calls, they have also been trying to reach patient, but to no avail.  Since case has been posted I have yet been able to reach the patient.  Numerous attempts again today have been made but no answer, no unable to leave a message.

## 2020-11-13 ENCOUNTER — Ambulatory Visit: Admit: 2020-11-13 | Discharge: 2020-11-14 | Payer: BLUE CROSS/BLUE SHIELD

## 2020-11-13 DIAGNOSIS — L720 Epidermal cyst: Principal | ICD-10-CM

## 2020-11-14 ENCOUNTER — Encounter: Payer: Self-pay | Admitting: Surgery

## 2020-11-14 ENCOUNTER — Encounter
Admission: RE | Disposition: A | Discharge status: Home / Self Care | Payer: Self-pay | Source: Home / Self Care | Attending: Surgery

## 2020-11-14 ENCOUNTER — Other Ambulatory Visit: Payer: Self-pay

## 2020-11-14 ENCOUNTER — Ambulatory Visit: Payer: Medicaid Other | Admitting: Anesthesiology

## 2020-11-14 ENCOUNTER — Ambulatory Visit
Admission: RE | Admit: 2020-11-14 | Discharge: 2020-11-14 | Disposition: A | Discharge status: Home / Self Care | Payer: Medicaid Other | Attending: Surgery | Admitting: Surgery

## 2020-11-14 DIAGNOSIS — E669 Obesity, unspecified: Secondary | ICD-10-CM | POA: Diagnosis not present

## 2020-11-14 DIAGNOSIS — D1721 Benign lipomatous neoplasm of skin and subcutaneous tissue of right arm: Secondary | ICD-10-CM | POA: Insufficient documentation

## 2020-11-14 DIAGNOSIS — Z791 Long term (current) use of non-steroidal anti-inflammatories (NSAID): Secondary | ICD-10-CM | POA: Diagnosis not present

## 2020-11-14 DIAGNOSIS — D1722 Benign lipomatous neoplasm of skin and subcutaneous tissue of left arm: Secondary | ICD-10-CM

## 2020-11-14 DIAGNOSIS — Z7951 Long term (current) use of inhaled steroids: Secondary | ICD-10-CM | POA: Diagnosis not present

## 2020-11-14 DIAGNOSIS — Z7984 Long term (current) use of oral hypoglycemic drugs: Secondary | ICD-10-CM | POA: Insufficient documentation

## 2020-11-14 DIAGNOSIS — Z882 Allergy status to sulfonamides status: Secondary | ICD-10-CM | POA: Insufficient documentation

## 2020-11-14 DIAGNOSIS — Z79890 Hormone replacement therapy (postmenopausal): Secondary | ICD-10-CM | POA: Insufficient documentation

## 2020-11-14 DIAGNOSIS — Z888 Allergy status to other drugs, medicaments and biological substances status: Secondary | ICD-10-CM | POA: Diagnosis not present

## 2020-11-14 DIAGNOSIS — D1720 Benign lipomatous neoplasm of skin and subcutaneous tissue of unspecified limb: Secondary | ICD-10-CM

## 2020-11-14 DIAGNOSIS — Z6841 Body Mass Index (BMI) 40.0 and over, adult: Secondary | ICD-10-CM | POA: Insufficient documentation

## 2020-11-14 DIAGNOSIS — Z91013 Allergy to seafood: Secondary | ICD-10-CM | POA: Insufficient documentation

## 2020-11-14 DIAGNOSIS — Z91041 Radiographic dye allergy status: Secondary | ICD-10-CM | POA: Diagnosis not present

## 2020-11-14 DIAGNOSIS — Z79899 Other long term (current) drug therapy: Secondary | ICD-10-CM | POA: Diagnosis not present

## 2020-11-14 HISTORY — PX: LIPOMA EXCISION: SHX5283

## 2020-11-14 SURGERY — EXCISION LIPOMA
Anesthesia: General | Laterality: Bilateral

## 2020-11-14 MED ORDER — CEFAZOLIN SODIUM-DEXTROSE 2-4 GM/100ML-% IV SOLN
INTRAVENOUS | Status: AC
  Filled 2020-11-14: qty 100

## 2020-11-14 MED ORDER — MIDAZOLAM HCL 2 MG/2ML IJ SOLN
INTRAMUSCULAR | Status: DC | PRN
  Administered 2020-11-14: 2 mg via INTRAVENOUS

## 2020-11-14 MED ORDER — CHLORHEXIDINE GLUCONATE 0.12 % MT SOLN: 15.0000 mL | Freq: Once | OROMUCOSAL | Status: AC

## 2020-11-14 MED ORDER — SUGAMMADEX SODIUM 200 MG/2ML IV SOLN
INTRAVENOUS | Status: DC | PRN
  Administered 2020-11-14: 200 mg via INTRAVENOUS

## 2020-11-14 MED ORDER — CEFAZOLIN SODIUM-DEXTROSE 2-4 GM/100ML-% IV SOLN
2.0000 g | INTRAVENOUS | Status: AC
  Administered 2020-11-14: 2 g via INTRAVENOUS

## 2020-11-14 MED ORDER — CHLORHEXIDINE GLUCONATE CLOTH 2 % EX PADS
6.0000 | MEDICATED_PAD | Freq: Once | CUTANEOUS | Status: AC
  Administered 2020-11-14: 6 via TOPICAL

## 2020-11-14 MED ORDER — MIDAZOLAM HCL 2 MG/2ML IJ SOLN
INTRAMUSCULAR | Status: AC
  Filled 2020-11-14: qty 2

## 2020-11-14 MED ORDER — CHLORHEXIDINE GLUCONATE CLOTH 2 % EX PADS: 6.0000 | MEDICATED_PAD | Freq: Once | CUTANEOUS | Status: DC

## 2020-11-14 MED ORDER — LACTATED RINGERS IV SOLN: INTRAVENOUS | Status: DC

## 2020-11-14 MED ORDER — GABAPENTIN 300 MG PO CAPS
ORAL_CAPSULE | ORAL | Status: AC
  Administered 2020-11-14: 300 mg via ORAL
  Filled 2020-11-14: qty 1

## 2020-11-14 MED ORDER — ACETAMINOPHEN 500 MG PO TABS: 1000.0000 mg | ORAL_TABLET | ORAL | Status: AC

## 2020-11-14 MED ORDER — LIDOCAINE HCL (CARDIAC) PF 100 MG/5ML IV SOSY
PREFILLED_SYRINGE | INTRAVENOUS | Status: DC | PRN
  Administered 2020-11-14: 100 mg via INTRAVENOUS

## 2020-11-14 MED ORDER — DEXAMETHASONE SODIUM PHOSPHATE 10 MG/ML IJ SOLN
INTRAMUSCULAR | Status: AC
  Filled 2020-11-14: qty 1

## 2020-11-14 MED ORDER — ACETAMINOPHEN 500 MG PO TABS: 1000.0000 mg | ORAL_TABLET | Freq: Four times a day (QID) | ORAL | Status: AC | PRN

## 2020-11-14 MED ORDER — BUPIVACAINE LIPOSOME 1.3 % IJ SUSP: 20.0000 mL | Freq: Once | INTRAMUSCULAR | Status: DC

## 2020-11-14 MED ORDER — KETOROLAC TROMETHAMINE 30 MG/ML IJ SOLN
INTRAMUSCULAR | Status: DC | PRN
  Administered 2020-11-14: 30 mg via INTRAVENOUS

## 2020-11-14 MED ORDER — OXYCODONE HCL 5 MG PO TABS: 5.0000 mg | ORAL_TABLET | ORAL | 0 refills | Status: DC | PRN

## 2020-11-14 MED ORDER — PENTAFLUOROPROP-TETRAFLUOROETH EX AERO
INHALATION_SPRAY | CUTANEOUS | Status: AC
  Filled 2020-11-14: qty 30

## 2020-11-14 MED ORDER — DEXMEDETOMIDINE HCL IN NACL 200 MCG/50ML IV SOLN
INTRAVENOUS | Status: DC | PRN
  Administered 2020-11-14: 12 ug via INTRAVENOUS
  Administered 2020-11-14: 8 ug via INTRAVENOUS

## 2020-11-14 MED ORDER — BUPIVACAINE LIPOSOME 1.3 % IJ SUSP
INTRAMUSCULAR | Status: AC
  Filled 2020-11-14: qty 20

## 2020-11-14 MED ORDER — BUPIVACAINE LIPOSOME 1.3 % IJ SUSP
INTRAMUSCULAR | Status: DC | PRN
  Administered 2020-11-14: 17 mL
  Administered 2020-11-14: 33 mL

## 2020-11-14 MED ORDER — FENTANYL CITRATE (PF) 100 MCG/2ML IJ SOLN
INTRAMUSCULAR | Status: DC | PRN
  Administered 2020-11-14 (×2): 50 ug via INTRAVENOUS

## 2020-11-14 MED ORDER — PROPOFOL 10 MG/ML IV BOLUS
INTRAVENOUS | Status: DC | PRN
  Administered 2020-11-14: 200 mg via INTRAVENOUS

## 2020-11-14 MED ORDER — PROPOFOL 10 MG/ML IV BOLUS
INTRAVENOUS | Status: AC
  Filled 2020-11-14: qty 20

## 2020-11-14 MED ORDER — ORAL CARE MOUTH RINSE: 15.0000 mL | Freq: Once | OROMUCOSAL | Status: AC

## 2020-11-14 MED ORDER — FENTANYL CITRATE (PF) 100 MCG/2ML IJ SOLN
INTRAMUSCULAR | Status: AC
  Filled 2020-11-14: qty 2

## 2020-11-14 MED ORDER — CHLORHEXIDINE GLUCONATE 0.12 % MT SOLN
OROMUCOSAL | Status: AC
  Administered 2020-11-14: 15 mL via OROMUCOSAL
  Filled 2020-11-14: qty 15

## 2020-11-14 MED ORDER — ROCURONIUM BROMIDE 100 MG/10ML IV SOLN
INTRAVENOUS | Status: DC | PRN
  Administered 2020-11-14: 50 mg via INTRAVENOUS

## 2020-11-14 MED ORDER — 0.9 % SODIUM CHLORIDE (POUR BTL) OPTIME
TOPICAL | Status: DC | PRN
  Administered 2020-11-14: 1000 mL

## 2020-11-14 MED ORDER — KETOROLAC TROMETHAMINE 30 MG/ML IJ SOLN
INTRAMUSCULAR | Status: AC
  Filled 2020-11-14: qty 1

## 2020-11-14 MED ORDER — LACTATED RINGERS IV SOLN: INTRAVENOUS | Status: DC | PRN

## 2020-11-14 MED ORDER — GABAPENTIN 300 MG PO CAPS: 300.0000 mg | ORAL_CAPSULE | ORAL | Status: AC

## 2020-11-14 MED ORDER — ONDANSETRON HCL 4 MG/2ML IJ SOLN
INTRAMUSCULAR | Status: DC | PRN
  Administered 2020-11-14: 4 mg via INTRAVENOUS

## 2020-11-14 MED ORDER — DEXAMETHASONE SODIUM PHOSPHATE 10 MG/ML IJ SOLN
INTRAMUSCULAR | Status: DC | PRN
  Administered 2020-11-14: 5 mg via INTRAVENOUS

## 2020-11-14 MED ORDER — ONDANSETRON HCL 4 MG/2ML IJ SOLN
INTRAMUSCULAR | Status: AC
  Filled 2020-11-14: qty 2

## 2020-11-14 MED ORDER — ACETAMINOPHEN 500 MG PO TABS
ORAL_TABLET | ORAL | Status: AC
  Administered 2020-11-14: 1000 mg via ORAL
  Filled 2020-11-14: qty 2

## 2020-11-14 SURGICAL SUPPLY — 34 items
ADH SKN CLS APL DERMABOND .7 (GAUZE/BANDAGES/DRESSINGS) ×1
APL PRP STRL LF DISP 70% ISPRP (MISCELLANEOUS) ×1
CHLORAPREP W/TINT 26 (MISCELLANEOUS) ×2 IMPLANT
DERMABOND ADVANCED (GAUZE/BANDAGES/DRESSINGS) ×1
DERMABOND ADVANCED .7 DNX12 (GAUZE/BANDAGES/DRESSINGS) ×1 IMPLANT
DRAPE 3/4 80X56 (DRAPES) ×4 IMPLANT
DRAPE LAPAROTOMY 100X77 ABD (DRAPES) ×2 IMPLANT
ELECT CAUTERY BLADE TIP 2.5 (TIP) ×2
ELECT REM PT RETURN 9FT ADLT (ELECTROSURGICAL) ×2
ELECTRODE CAUTERY BLDE TIP 2.5 (TIP) ×1 IMPLANT
ELECTRODE REM PT RTRN 9FT ADLT (ELECTROSURGICAL) ×1 IMPLANT
GAUZE 4X4 16PLY ~~LOC~~+RFID DBL (SPONGE) ×2 IMPLANT
GLOVE SURG SYN 7.0 (GLOVE) ×2 IMPLANT
GLOVE SURG SYN 7.5  E (GLOVE) ×2
GLOVE SURG SYN 7.5 E (GLOVE) ×1 IMPLANT
GOWN STRL REUS W/ TWL LRG LVL3 (GOWN DISPOSABLE) ×2 IMPLANT
GOWN STRL REUS W/TWL LRG LVL3 (GOWN DISPOSABLE) ×4
KIT TURNOVER KIT A (KITS) ×2 IMPLANT
LABEL OR SOLS (LABEL) IMPLANT
MANIFOLD NEPTUNE II (INSTRUMENTS) ×2 IMPLANT
NEEDLE HYPO 22GX1.5 SAFETY (NEEDLE) ×2 IMPLANT
NS IRRIG 1000ML POUR BTL (IV SOLUTION) ×2 IMPLANT
PACK BASIN MINOR ARMC (MISCELLANEOUS) ×2 IMPLANT
SUT MNCRL 4-0 (SUTURE) ×4
SUT MNCRL 4-0 27XMFL (SUTURE) ×2
SUT SILK 3 0 (SUTURE) ×2
SUT SILK 3-0 18XBRD TIE 12 (SUTURE) ×1 IMPLANT
SUT VIC AB 0 SH 27 (SUTURE) IMPLANT
SUT VIC AB 3-0 SH 27 (SUTURE) ×2
SUT VIC AB 3-0 SH 27X BRD (SUTURE) ×1 IMPLANT
SUTURE MNCRL 4-0 27XMF (SUTURE) ×2 IMPLANT
SYR 30ML LL (SYRINGE) ×2 IMPLANT
TAPE TRANSPORE STRL 2 31045 (GAUZE/BANDAGES/DRESSINGS) ×2 IMPLANT
WATER STERILE IRR 500ML POUR (IV SOLUTION) IMPLANT

## 2020-11-14 NOTE — Interval H&P Note (Signed)
History and Physical Interval Note:  11/14/2020 1:16 PM  Katie Ryan  has presented today for surgery, with the diagnosis of Bilateral forearm lipomas.  The various methods of treatment have been discussed with the patient and family. After consideration of risks, benefits and other options for treatment, the patient has consented to  Procedure(s) with comments: EXCISION LIPOMA (Bilateral) - bilateral forearms as a surgical intervention.  The patient's history has been reviewed, patient examined, no change in status, stable for surgery.  I have reviewed the patient's chart and labs.  Questions were answered to the patient's satisfaction.     Krystiana Fornes

## 2020-11-14 NOTE — Anesthesia Preprocedure Evaluation (Addendum)
Anesthesia Evaluation  Patient identified by MRN, date of birth, ID band Patient awake    Reviewed: Allergy & Precautions, NPO status , Patient's Chart, lab work & pertinent test results  History of Anesthesia Complications Negative for: history of anesthetic complications  Airway Mallampati: II  TM Distance: >3 FB Neck ROM: Full    Dental no notable dental hx.    Pulmonary asthma (mild intermittent) , neg sleep apnea,    breath sounds clear to auscultation- rhonchi (-) wheezing      Cardiovascular Exercise Tolerance: Good hypertension, Pt. on medications (-) CAD, (-) Past MI, (-) Cardiac Stents and (-) CABG  Rhythm:Regular Rate:Normal - Systolic murmurs and - Diastolic murmurs    Neuro/Psych neg Seizures negative neurological ROS  negative psych ROS   GI/Hepatic negative GI ROS, Neg liver ROS,   Endo/Other  neg diabetesHypothyroidism   Renal/GU negative Renal ROS     Musculoskeletal negative musculoskeletal ROS (+)   Abdominal (+) + obese,   Peds  Hematology negative hematology ROS (+)   Anesthesia Other Findings Past Medical History: No date: Asthma No date: Back pain 11/07/2017: Closed dislocation of distal tibia     Comment:  right No date: History of kidney stones No date: Hyperlipidemia No date: Hypertension No date: Hypothyroidism No date: Morbid obesity with BMI of 45.0-49.9, adult ([place]) No date: Pre-diabetes 2016: Pulmonary embolism (HCC)   Reproductive/Obstetrics                             Anesthesia Physical Anesthesia Plan  ASA: 2  Anesthesia Plan: General   Post-op Pain Management:    Induction: Intravenous  PONV Risk Score and Plan: 2 and Ondansetron, Dexamethasone and Midazolam  Airway Management Planned: Oral ETT  Additional Equipment:   Intra-op Plan:   Post-operative Plan: Extubation in OR  Informed Consent: I have reviewed the patients History  and Physical, chart, labs and discussed the procedure including the risks, benefits and alternatives for the proposed anesthesia with the patient or authorized representative who has indicated his/her understanding and acceptance.     Dental advisory given  Plan Discussed with: CRNA and Anesthesiologist  Anesthesia Plan Comments:        Anesthesia Quick Evaluation

## 2020-11-14 NOTE — Op Note (Signed)
Procedure Date:  11/14/2020  Pre-operative Diagnosis:  Right forearm lipoma x 1, Left forearm lipomas x 2  Post-operative Diagnosis: Right forearm lipoma x 1, Left forearm lipomas x 2  Procedure:  Excision of Right forearm lipoma x 1, Excision of Left forearm lipomas x 2  Surgeon:  Melvyn Neth, MD  Anesthesia:  General endotracheal  Estimated Blood Loss:  10 ml  Specimens:   Right forearm lipoma Left forearm distal lipoma Left forearm proximal lipoma  Complications:  None  Indications for Procedure:  This is a 50 y.o. female with diagnosis of a symptomatic bilateral forearm lipomas.  The patient wishes to have them excised. The risks of bleeding, abscess or infection, injury to surrounding structures, and need for further procedures were all discussed with the patient and she was willing to proceed.  Description of Procedure: The patient was correctly identified in the preoperative area and brought into the operating room.  The patient was placed supine with VTE prophylaxis in place.  Appropriate time-outs were performed.  Anesthesia was induced and the patient was intubated.  Appropriate antibiotics were infused.  The patient's bilateral forearms were prepped and draped in usual sterile fashion.  We started with the right forearm.  The mass was located at the [facility] [employer] fossa.  An elliptical 5 cm incision was made over the lipoma, which measured 4 cm, and cautery was used to dissect down the subcutaneous tissue to the lipoma itself.  Skin flaps were created using cautery as well, and then the lipoma was excised using cautery, intact.  It was sent off to pathology.  The cavity was then irrigated and hemostasis was assured with cautery.  Local anesthetic was infused intradermally.  The skin flaps were then approximated to the wound bed using multiple 3-0 Vicryl sutures in order to decrease the amount of dead space in the wound.  The wound edges were then closed in two layers using 3-0 Vicryl  and 4-0 Monocryl.    We then moved to the left forearm.  The masses were located in the mid and distal aspects of the dorsal forearm.  A 5 cm incision was made over the distal lipoma, which also measured about 4 cm.  Cautery was used to dissect down the subcutaneous tissue to the lipoma itself.  Skin flaps were created using cautery as well, and then the lipoma was excised using cautery, intact.  In doing so, a superficial vein was encountered which traveled through the lipoma.  It was ligated distally and proximally and cut along with the specimen.  The mass was sent off to pathology.  The cavity was then irrigated and hemostasis was assured with cautery.  Local anesthetic was infused intradermally.  The skin flaps were then approximated to the wound bed using multiple 3-0 Vicryl sutures in order to decrease the amount of dead space in the wound.  The wound edges were then closed in two layers using 3-0 Vicryl and 4-0 Monocryl.    Then a 4 cm incision was made over the proximal left forearm lipoma, which measured about 3 cm.  The incision was cleaned and sealed with DermaBond.  Cautery was used to dissect down the subcutaneous tissue to the lipoma itself.  Skin flaps were created using cautery as well, and then the lipoma was excised using cautery, intact.  It was sent off to pathology.  The cavity was then irrigated and hemostasis was assured with cautery.  Local anesthetic was infused intradermally.  The skin flaps were then  approximated to the wound bed using multiple 3-0 Vicryl sutures in order to decrease the amount of dead space in the wound.  The wound edges were then closed in two layers using 3-0 Vicryl and 4-0 Monocryl.    The incisions were then all cleaned and sealed with DermaBond.  The patient was then emerged from anesthesia, extubated, and brought to the recovery room for further management.    The patient tolerated the procedure well and all counts were correct at the end of the  case.   Melvyn Neth, MD

## 2020-11-14 NOTE — OR Nursing (Signed)
Two gold necklaces removed from patient in OR 3 at 1350 and placed into patient labeled biohazard bag and clipped in patient's chart binder.

## 2020-11-14 NOTE — Discharge Instructions (Signed)

## 2020-11-14 NOTE — Anesthesia Procedure Notes (Addendum)
Procedure Name: Intubation Date/Time: 11/14/2020 2:01 PM Performed by: Babs Sciara, CRNA Pre-anesthesia Checklist: Patient identified, Emergency Drugs available, Suction available and Patient being monitored Patient Re-evaluated:Patient Re-evaluated prior to induction Oxygen Delivery Method: Circle system utilized Preoxygenation: Pre-oxygenation with 100% oxygen Induction Type: IV induction Ventilation: Mask ventilation without difficulty Grade View: Grade II Tube type: Oral Number of attempts: 1 Airway Equipment and Method: Stylet and Oral airway Placement Confirmation: ETT inserted through vocal cords under direct vision, positive ETCO2 and breath sounds checked- equal and bilateral Secured at: 21 (lip) cm Tube secured with: Tape Dental Injury: Teeth and Oropharynx as per pre-operative assessment

## 2020-11-14 NOTE — Transfer of Care (Signed)
Immediate Anesthesia Transfer of Care Note  Patient: Katie Ryan  Procedure(s) Performed: EXCISION LIPOMA (Bilateral)  Patient Location: PACU  Anesthesia Type:General  Level of Consciousness: drowsy  Airway & Oxygen Therapy: Patient Spontanous Breathing and Patient connected to face mask oxygen  Post-op Assessment: Report given to RN and Post -op Vital signs reviewed and stable  Post vital signs: Reviewed and stable  Last Vitals: see EPIC Vitals Value Taken Time  BP    Temp    Pulse    Resp    SpO2      Last Pain:  Vitals:   11/14/20 1227  TempSrc: Oral  PainSc: 0-No pain         Complications: No notable events documented.

## 2020-11-17 ENCOUNTER — Encounter: Payer: Self-pay | Admitting: Surgery

## 2020-11-17 NOTE — Anesthesia Postprocedure Evaluation (Signed)
Anesthesia Post Note  Patient: Katie Ryan  Procedure(s) Performed: EXCISION LIPOMA (Bilateral)  Patient location during evaluation: PACU Anesthesia Type: General Level of consciousness: awake and alert and oriented Pain management: pain level controlled Vital Signs Assessment: post-procedure vital signs reviewed and stable Respiratory status: spontaneous breathing, nonlabored ventilation and respiratory function stable Cardiovascular status: blood pressure returned to baseline and stable Postop Assessment: no signs of nausea or vomiting Anesthetic complications: no   No notable events documented.   Last Vitals:  Vitals:   11/14/20 1645 11/14/20 1707  BP: (!) 145/90 (!) 165/97  Pulse: 78 84  Resp: 16 18  Temp:  (!) 36.3 C  SpO2: 96% 100%    Last Pain:  Vitals:   11/14/20 1707  TempSrc: Temporal  PainSc: 0-No pain                 Nick Stults

## 2020-11-18 LAB — SURGICAL PATHOLOGY

## 2020-11-19 ENCOUNTER — Ambulatory Visit: Admit: 2020-11-19 | Discharge: 2020-11-20 | Payer: BLUE CROSS/BLUE SHIELD

## 2020-11-19 MED ORDER — ERYTHROMYCIN 5 MG/GRAM (0.5 %) EYE OINTMENT
3 refills | 0 days | Status: CP
Start: 2020-11-19 — End: ?

## 2020-11-19 MED FILL — METFORMIN ER 500 MG TABLET,EXTENDED RELEASE 24 HR: ORAL | 30 days supply | Qty: 120 | Fill #1

## 2020-11-19 MED FILL — OZEMPIC 1 MG/DOSE (4 MG/3 ML) SUBCUTANEOUS PEN INJECTOR: SUBCUTANEOUS | 28 days supply | Qty: 3 | Fill #2

## 2020-11-20 DIAGNOSIS — L658 Other specified nonscarring hair loss: Principal | ICD-10-CM

## 2020-11-20 MED ORDER — SPIRONOLACTONE 100 MG TABLET
ORAL_TABLET | Freq: Two times a day (BID) | ORAL | 1 refills | 90.00000 days | Status: CP
Start: 2020-11-20 — End: 2021-05-19
  Filled 2020-11-28: qty 180, 90d supply, fill #0

## 2020-11-28 ENCOUNTER — Other Ambulatory Visit: Payer: Self-pay

## 2020-11-28 ENCOUNTER — Encounter: Payer: Self-pay | Admitting: Physician Assistant

## 2020-11-28 ENCOUNTER — Ambulatory Visit (INDEPENDENT_AMBULATORY_CARE_PROVIDER_SITE_OTHER): Payer: Medicaid Other | Admitting: Physician Assistant

## 2020-11-28 VITALS — BP 123/75 | HR 91 | Temp 98.8°F | Ht 61.0 in | Wt 230.8 lb

## 2020-11-28 DIAGNOSIS — Z09 Encounter for follow-up examination after completed treatment for conditions other than malignant neoplasm: Secondary | ICD-10-CM

## 2020-11-28 DIAGNOSIS — Z6841 Body Mass Index (BMI) 40.0 and over, adult: Secondary | ICD-10-CM | POA: Insufficient documentation

## 2020-11-28 DIAGNOSIS — J45909 Unspecified asthma, uncomplicated: Secondary | ICD-10-CM | POA: Insufficient documentation

## 2020-11-28 DIAGNOSIS — D1720 Benign lipomatous neoplasm of skin and subcutaneous tissue of unspecified limb: Secondary | ICD-10-CM

## 2020-11-28 MED ORDER — AMOXICILLIN-POT CLAVULANATE 875-125 MG PO TABS: 1.0000 | ORAL_TABLET | Freq: Two times a day (BID) | ORAL | 0 refills | Status: AC

## 2020-11-28 NOTE — Patient Instructions (Signed)
Please pick up prescription at your pharmacy.  If you have any concerns or questions, please feel free to call our office. See follow up appointment below.   Lipoma Removal, Care After  This sheet gives you information about how to care for yourself after your procedure. Your health care provider may also give you more specific instructions. If you have problems or questions, contact your health care provider. What can I expect after the procedure? After the procedure, it is common to have: Mild pain. Swelling. Bruising. Follow these instructions at home: Bathing  Do not take baths, swim, or use a hot tub until your health care provider approves. Ask your health care provider if you may take showers. You may only be allowed to take sponge baths. Keep your bandage (dressing) dry until your health care provider says it can be removed. Incision care  Follow instructions from your health care provider about how to take care of your incision. Make sure you: Wash your hands with soap and water for at least 20 seconds before and after you change your dressing. If soap and water are not available, use hand sanitizer. Change your dressing as told by your health care provider. Leave stitches (sutures), skin glue, or adhesive strips in place. These skin closures may need to stay in place for 2 weeks or longer. If adhesive strip edges start to loosen and curl up, you may trim the loose edges. Do not remove adhesive strips completely unless your health care provider tells you to do that. Check your incision area every day for signs of infection. Check for: More redness, swelling, or pain. Fluid or blood. Warmth. Pus or a bad smell. Medicines Take over-the-counter and prescription medicines only as told by your health care provider. If you were prescribed an antibiotic medicine, use it as told by your health care provider. Do not stop using the antibiotic even if you start to feel better. General  instructions  If you were given a sedative during the procedure, it can affect you for several hours. Do not drive or operate machinery until your health care provider says that it is safe. Do not use any products that contain nicotine or tobacco, such as cigarettes, e-cigarettes, and chewing tobacco. These can delay healing. If you need help quitting, ask your health care provider. Return to your normal activities as told by your health care provider. Ask your health care provider what activities are safe for you. Keep all follow-up visits as told by your health care provider. This is important. Contact a health care provider if: You have more redness, swelling, or pain around your incision. You have fluid or blood coming from your incision. Your incision feels warm to the touch. You have pus or a bad smell coming from your incision. You have pain that does not get better with medicine. Get help right away if: You have chills or a fever. You have severe pain. Summary After the procedure, it is common to have mild pain, swelling, and bruising. Follow instructions from your health care provider about how to take care of your incision. Check your incision area every day for signs of infection. Contact a health care provider if you have more redness, swelling, or pain around your incision. This information is not intended to replace advice given to you by your health care provider. Make sure you discuss any questions you have with your health care provider. Document Revised: 10/23/2018 Document Reviewed: 10/23/2018 Elsevier Patient Education  [place].

## 2020-11-28 NOTE — Progress Notes (Signed)
aug

## 2020-11-28 NOTE — Progress Notes (Signed)
[facility] POST-OP OFFICE VISIT  11/28/2020  HPI: Katie Ryan is a 50 y.o. female 14 days s/p bilateral forearm lipoma removal x3 with Dr Hampton Abbot.   She had been doing well at home until about 2-3 days ago. Around that time, she noticed that the incisions on her left arm began draining clear fluid. This ultimately turned into the thicker output and foul smelling. No fever, chills at home.   Vital signs: BP 123/75   Pulse 91   Temp 98.8 F (37.1 C) (Oral)   Ht '5\' 1"'$  (1.549 m)   Wt 230 lb 12.8 oz (104.7 kg)   LMP 01/30/2014   SpO2 95%   BMI 43.61 kg/m    Physical Exam: Constitutional: Well appearing female, NAD Skin: Lipoma excision to the right forearm is well healed. She had two incisions to the left lateral distal forearm which appear to have dehisced some on the skin level, there is no active drainage appreciable, central scabbing, no appreciable fluid collections or expressible drainage  Assessment/Plan: This is a 50 y.o. female 14 days s/p bilateral forearm lipoma removal x3    - As a precaution, I will give her 1 week of Augmentin BID  - Pain control prn  - Reviewed wound care  - I will have her rtc in 1 week for re-check   -- Edison Simon, PA-C [name] 11/28/2020, 12:42 PM [phone] M-F: 7am - 4pm

## 2020-12-03 DIAGNOSIS — D1720 Benign lipomatous neoplasm of skin and subcutaneous tissue of unspecified limb: Secondary | ICD-10-CM

## 2020-12-04 ENCOUNTER — Encounter: Payer: Medicaid Other | Admitting: Physician Assistant

## 2020-12-05 ENCOUNTER — Other Ambulatory Visit: Payer: Self-pay

## 2020-12-05 ENCOUNTER — Encounter: Payer: Medicaid Other | Admitting: Physician Assistant

## 2020-12-05 MED FILL — CELECOXIB 200 MG CAPSULE: ORAL | 90 days supply | Qty: 180 | Fill #0

## 2020-12-05 MED FILL — PREMARIN 0.3 MG TABLET: ORAL | 84 days supply | Qty: 63 | Fill #1

## 2020-12-05 MED FILL — SYNTHROID 50 MCG TABLET: ORAL | 90 days supply | Qty: 90 | Fill #2

## 2020-12-05 MED FILL — LOSARTAN 25 MG TABLET: ORAL | 90 days supply | Qty: 90 | Fill #1

## 2020-12-06 ENCOUNTER — Ambulatory Visit
Admit: 2020-12-06 | Discharge: 2020-12-06 | Disposition: A | Discharge status: Home / Self Care | Payer: BLUE CROSS/BLUE SHIELD | Attending: Emergency Medicine

## 2020-12-06 DIAGNOSIS — T148XXA Other injury of unspecified body region, initial encounter: Principal | ICD-10-CM

## 2020-12-06 DIAGNOSIS — L089 Local infection of the skin and subcutaneous tissue, unspecified: Principal | ICD-10-CM

## 2020-12-06 MED ORDER — CEPHALEXIN 500 MG CAPSULE
ORAL_CAPSULE | Freq: Four times a day (QID) | ORAL | 0 refills | 5.00000 days | Status: CP
Start: 2020-12-06 — End: 2020-12-11

## 2020-12-06 MED ORDER — DOXYCYCLINE HYCLATE 100 MG CAPSULE
ORAL_CAPSULE | Freq: Two times a day (BID) | ORAL | 0 refills | 5 days | Status: CP
Start: 2020-12-06 — End: 2020-12-11

## 2020-12-10 ENCOUNTER — Ambulatory Visit
Admit: 2020-12-10 | Discharge: 2020-12-11 | Payer: BLUE CROSS/BLUE SHIELD | Attending: Physician Assistant | Primary: Physician Assistant

## 2020-12-15 DIAGNOSIS — Z6841 Body Mass Index (BMI) 40.0 and over, adult: Principal | ICD-10-CM

## 2020-12-15 DIAGNOSIS — R635 Abnormal weight gain: Principal | ICD-10-CM

## 2020-12-15 MED ORDER — FLUCONAZOLE 150 MG TABLET
ORAL_TABLET | 0 refills | 0 days | Status: CP
Start: 2020-12-15 — End: ?

## 2020-12-17 ENCOUNTER — Ambulatory Visit
Admit: 2020-12-17 | Discharge: 2020-12-18 | Payer: BLUE CROSS/BLUE SHIELD | Attending: Physician Assistant | Primary: Physician Assistant

## 2020-12-17 DIAGNOSIS — Z6841 Body Mass Index (BMI) 40.0 and over, adult: Principal | ICD-10-CM

## 2020-12-17 DIAGNOSIS — L089 Local infection of the skin and subcutaneous tissue, unspecified: Principal | ICD-10-CM

## 2020-12-17 DIAGNOSIS — D1720 Benign lipomatous neoplasm of skin and subcutaneous tissue of unspecified limb: Principal | ICD-10-CM

## 2020-12-17 DIAGNOSIS — R635 Abnormal weight gain: Principal | ICD-10-CM

## 2020-12-17 DIAGNOSIS — T148XXA Other injury of unspecified body region, initial encounter: Principal | ICD-10-CM

## 2020-12-17 DIAGNOSIS — T8131XA Disruption of external operation (surgical) wound, not elsewhere classified, initial encounter: Principal | ICD-10-CM

## 2020-12-18 ENCOUNTER — Ambulatory Visit
Admit: 2020-12-18 | Discharge: 2020-12-19 | Payer: BLUE CROSS/BLUE SHIELD | Attending: Physical Medicine & Rehabilitation | Primary: Physical Medicine & Rehabilitation

## 2020-12-18 DIAGNOSIS — M533 Sacrococcygeal disorders, not elsewhere classified: Principal | ICD-10-CM

## 2020-12-18 DIAGNOSIS — M47816 Spondylosis without myelopathy or radiculopathy, lumbar region: Principal | ICD-10-CM

## 2020-12-18 DIAGNOSIS — Z6841 Body Mass Index (BMI) 40.0 and over, adult: Principal | ICD-10-CM

## 2020-12-18 DIAGNOSIS — R635 Abnormal weight gain: Principal | ICD-10-CM

## 2020-12-18 MED FILL — PREMARIN 0.625 MG/GRAM VAGINAL CREAM: VAGINAL | 60 days supply | Qty: 30 | Fill #1

## 2020-12-18 MED FILL — OZEMPIC 1 MG/DOSE (4 MG/3 ML) SUBCUTANEOUS PEN INJECTOR: SUBCUTANEOUS | 28 days supply | Qty: 3 | Fill #3

## 2020-12-19 DIAGNOSIS — Z6841 Body Mass Index (BMI) 40.0 and over, adult: Principal | ICD-10-CM

## 2020-12-19 DIAGNOSIS — R635 Abnormal weight gain: Principal | ICD-10-CM

## 2020-12-20 MED ORDER — METFORMIN ER 500 MG TABLET,EXTENDED RELEASE 24 HR
ORAL_TABLET | Freq: Every day | ORAL | 1 refills | 30.00000 days | Status: CP
Start: 2020-12-20 — End: ?

## 2020-12-24 ENCOUNTER — Ambulatory Visit
Admit: 2020-12-24 | Discharge: 2020-12-25 | Payer: BLUE CROSS/BLUE SHIELD | Attending: Obesity Medicine | Primary: Obesity Medicine

## 2020-12-24 DIAGNOSIS — Z6841 Body Mass Index (BMI) 40.0 and over, adult: Principal | ICD-10-CM

## 2020-12-24 DIAGNOSIS — R635 Abnormal weight gain: Principal | ICD-10-CM

## 2020-12-24 MED ORDER — METFORMIN ER 500 MG TABLET,EXTENDED RELEASE 24 HR
ORAL_TABLET | Freq: Every day | ORAL | 5 refills | 30.00000 days | Status: CP
Start: 2020-12-24 — End: ?
  Filled 2020-12-23 – 2021-02-09 (×2): qty 120, 30d supply, fill #0

## 2020-12-24 MED ORDER — SEMAGLUTIDE 1 MG/DOSE (4 MG/3 ML) SUBCUTANEOUS PEN INJECTOR
SUBCUTANEOUS | 5 refills | 28 days | Status: CP
Start: 2020-12-24 — End: ?
  Filled 2021-01-15: qty 3, 28d supply, fill #0

## 2020-12-31 ENCOUNTER — Ambulatory Visit
Admit: 2020-12-31 | Discharge: 2021-01-01 | Payer: BLUE CROSS/BLUE SHIELD | Attending: Physician Assistant | Primary: Physician Assistant

## 2021-01-12 DIAGNOSIS — M47816 Spondylosis without myelopathy or radiculopathy, lumbar region: Principal | ICD-10-CM

## 2021-01-14 ENCOUNTER — Ambulatory Visit
Admit: 2021-01-14 | Discharge: 2021-01-15 | Payer: BLUE CROSS/BLUE SHIELD | Attending: Physician Assistant | Primary: Physician Assistant

## 2021-01-14 DIAGNOSIS — T8131XD Disruption of external operation (surgical) wound, not elsewhere classified, subsequent encounter: Principal | ICD-10-CM

## 2021-01-14 DIAGNOSIS — D1720 Benign lipomatous neoplasm of skin and subcutaneous tissue of unspecified limb: Principal | ICD-10-CM

## 2021-01-21 ENCOUNTER — Ambulatory Visit
Admit: 2021-01-21 | Discharge: 2021-01-22 | Payer: BLUE CROSS/BLUE SHIELD | Attending: Physical Medicine & Rehabilitation | Primary: Physical Medicine & Rehabilitation

## 2021-01-21 DIAGNOSIS — M533 Sacrococcygeal disorders, not elsewhere classified: Principal | ICD-10-CM

## 2021-01-26 ENCOUNTER — Ambulatory Visit: Admit: 2021-01-26 | Discharge: 2021-01-27 | Payer: BLUE CROSS/BLUE SHIELD

## 2021-01-30 ENCOUNTER — Ambulatory Visit: Admit: 2021-01-30 | Discharge: 2021-01-31 | Payer: BLUE CROSS/BLUE SHIELD | Attending: Family | Primary: Family

## 2021-01-30 DIAGNOSIS — L658 Other specified nonscarring hair loss: Principal | ICD-10-CM

## 2021-01-30 DIAGNOSIS — I10 Essential (primary) hypertension: Principal | ICD-10-CM

## 2021-01-30 DIAGNOSIS — Z23 Encounter for immunization: Principal | ICD-10-CM

## 2021-01-30 DIAGNOSIS — E039 Hypothyroidism, unspecified: Principal | ICD-10-CM

## 2021-01-30 DIAGNOSIS — Z0001 Encounter for general adult medical examination with abnormal findings: Principal | ICD-10-CM

## 2021-01-30 DIAGNOSIS — E7800 Pure hypercholesterolemia, unspecified: Principal | ICD-10-CM

## 2021-01-30 DIAGNOSIS — R7303 Prediabetes: Principal | ICD-10-CM

## 2021-01-30 DIAGNOSIS — K219 Gastro-esophageal reflux disease without esophagitis: Principal | ICD-10-CM

## 2021-01-30 DIAGNOSIS — J4520 Mild intermittent asthma, uncomplicated: Principal | ICD-10-CM

## 2021-01-30 MED ORDER — PRAVASTATIN 40 MG TABLET
ORAL_TABLET | Freq: Every evening | ORAL | 1 refills | 90 days | Status: CP
Start: 2021-01-30 — End: 2022-01-30

## 2021-01-30 MED ORDER — PANTOPRAZOLE 40 MG TABLET,DELAYED RELEASE
ORAL_TABLET | Freq: Two times a day (BID) | ORAL | 1 refills | 45 days | Status: CP
Start: 2021-01-30 — End: 2022-01-30

## 2021-01-30 MED ORDER — VANIQA 13.9 % TOPICAL CREAM
0 refills | 0 days | Status: CP
Start: 2021-01-30 — End: ?

## 2021-01-30 MED ORDER — SPIRONOLACTONE 100 MG TABLET
ORAL_TABLET | Freq: Two times a day (BID) | ORAL | 1 refills | 90 days | Status: CP
Start: 2021-01-30 — End: 2021-07-29

## 2021-02-06 DIAGNOSIS — E7800 Pure hypercholesterolemia, unspecified: Principal | ICD-10-CM

## 2021-02-06 DIAGNOSIS — L658 Other specified nonscarring hair loss: Principal | ICD-10-CM

## 2021-02-06 DIAGNOSIS — K219 Gastro-esophageal reflux disease without esophagitis: Principal | ICD-10-CM

## 2021-02-06 MED ORDER — PANTOPRAZOLE 40 MG TABLET,DELAYED RELEASE
ORAL_TABLET | Freq: Two times a day (BID) | ORAL | 1 refills | 45.00000 days | Status: CP
Start: 2021-02-06 — End: 2022-02-06
  Filled 2021-03-30: qty 90, 45d supply, fill #0

## 2021-02-06 MED ORDER — PRAVASTATIN 40 MG TABLET
ORAL_TABLET | Freq: Every evening | ORAL | 1 refills | 90.00000 days | Status: CP
Start: 2021-02-06 — End: 2022-02-06
  Filled 2021-02-16: qty 90, 90d supply, fill #0

## 2021-02-06 MED ORDER — SPIRONOLACTONE 100 MG TABLET
ORAL_TABLET | Freq: Two times a day (BID) | ORAL | 1 refills | 90 days | Status: CP
Start: 2021-02-06 — End: 2021-08-05
  Filled 2021-02-16: qty 180, 90d supply, fill #0

## 2021-02-06 MED ORDER — VANIQA 13.9 % TOPICAL CREAM
0 refills | 0 days | Status: CP
Start: 2021-02-06 — End: ?

## 2021-02-09 MED FILL — PREMARIN 0.625 MG/GRAM VAGINAL CREAM: VAGINAL | 60 days supply | Qty: 30 | Fill #2

## 2021-02-09 MED FILL — OZEMPIC 1 MG/DOSE (4 MG/3 ML) SUBCUTANEOUS PEN INJECTOR: SUBCUTANEOUS | 28 days supply | Qty: 3 | Fill #1

## 2021-02-18 DIAGNOSIS — E7800 Pure hypercholesterolemia, unspecified: Principal | ICD-10-CM

## 2021-03-09 DIAGNOSIS — N951 Menopausal and female climacteric states: Principal | ICD-10-CM

## 2021-03-09 MED ORDER — LOSARTAN 25 MG TABLET
ORAL_TABLET | Freq: Every day | ORAL | 1 refills | 90 days | Status: CP
Start: 2021-03-09 — End: 2022-03-09
  Filled 2021-03-10: qty 90, 90d supply, fill #0

## 2021-03-10 MED ORDER — CONJUGATED ESTROGENS 0.3 MG TABLET
ORAL_TABLET | Freq: Every day | ORAL | 1 refills | 90 days | Status: CP
Start: 2021-03-10 — End: 2022-03-10
  Filled 2021-03-11: qty 63, 84d supply, fill #0

## 2021-03-10 MED FILL — METFORMIN ER 500 MG TABLET,EXTENDED RELEASE 24 HR: ORAL | 30 days supply | Qty: 120 | Fill #1

## 2021-03-10 MED FILL — CELECOXIB 200 MG CAPSULE: ORAL | 90 days supply | Qty: 180 | Fill #1

## 2021-03-10 MED FILL — SYNTHROID 50 MCG TABLET: ORAL | 90 days supply | Qty: 90 | Fill #3

## 2021-03-10 MED FILL — OZEMPIC 1 MG/DOSE (4 MG/3 ML) SUBCUTANEOUS PEN INJECTOR: SUBCUTANEOUS | 28 days supply | Qty: 3 | Fill #2

## 2021-03-20 DIAGNOSIS — Z1231 Encounter for screening mammogram for malignant neoplasm of breast: Principal | ICD-10-CM

## 2021-03-25 ENCOUNTER — Ambulatory Visit
Admit: 2021-03-25 | Discharge: 2021-03-25 | Payer: BLUE CROSS/BLUE SHIELD | Attending: Student in an Organized Health Care Education/Training Program | Primary: Student in an Organized Health Care Education/Training Program

## 2021-03-25 ENCOUNTER — Ambulatory Visit: Admit: 2021-03-25 | Discharge: 2021-03-25 | Payer: BLUE CROSS/BLUE SHIELD

## 2021-03-25 DIAGNOSIS — L659 Nonscarring hair loss, unspecified: Principal | ICD-10-CM

## 2021-03-25 DIAGNOSIS — L905 Scar conditions and fibrosis of skin: Principal | ICD-10-CM

## 2021-03-25 MED ORDER — MINOXIDIL 2.5 MG TABLET
ORAL_TABLET | Freq: Every day | ORAL | 3 refills | 90.00000 days | Status: CP
Start: 2021-03-25 — End: 2022-03-25
  Filled 2021-03-30: qty 45, 90d supply, fill #0

## 2021-03-27 ENCOUNTER — Institutional Professional Consult (permissible substitution)
Admit: 2021-03-27 | Discharge: 2021-03-28 | Payer: BLUE CROSS/BLUE SHIELD | Attending: Ambulatory Care | Primary: Ambulatory Care

## 2021-03-27 MED ORDER — OZEMPIC 2 MG/DOSE (8 MG/3 ML) SUBCUTANEOUS PEN INJECTOR
SUBCUTANEOUS | 3 refills | 28 days | Status: CP
Start: 2021-03-27 — End: ?
  Filled 2021-03-30: qty 3, 28d supply, fill #0

## 2021-04-14 MED FILL — METFORMIN ER 500 MG TABLET,EXTENDED RELEASE 24 HR: ORAL | 30 days supply | Qty: 120 | Fill #2

## 2021-04-23 ENCOUNTER — Ambulatory Visit: Admit: 2021-04-23 | Discharge: 2021-04-24 | Payer: BLUE CROSS/BLUE SHIELD

## 2021-04-23 MED FILL — OZEMPIC 2 MG/DOSE (8 MG/3 ML) SUBCUTANEOUS PEN INJECTOR: SUBCUTANEOUS | 28 days supply | Qty: 3 | Fill #1

## 2021-04-29 ENCOUNTER — Ambulatory Visit: Admit: 2021-04-29 | Discharge: 2021-04-29 | Payer: BLUE CROSS/BLUE SHIELD | Attending: Family | Primary: Family

## 2021-04-29 DIAGNOSIS — M170 Bilateral primary osteoarthritis of knee: Principal | ICD-10-CM

## 2021-04-29 DIAGNOSIS — G894 Chronic pain syndrome: Principal | ICD-10-CM

## 2021-04-29 DIAGNOSIS — M7662 Achilles tendinitis, left leg: Principal | ICD-10-CM

## 2021-04-29 DIAGNOSIS — M19071 Primary osteoarthritis, right ankle and foot: Principal | ICD-10-CM

## 2021-04-29 DIAGNOSIS — M25561 Pain in right knee: Principal | ICD-10-CM

## 2021-04-29 DIAGNOSIS — G8929 Other chronic pain: Principal | ICD-10-CM

## 2021-04-29 DIAGNOSIS — Z01419 Encounter for gynecological examination (general) (routine) without abnormal findings: Principal | ICD-10-CM

## 2021-04-29 DIAGNOSIS — M25571 Pain in right ankle and joints of right foot: Principal | ICD-10-CM

## 2021-05-08 MED ORDER — HYLAFEM BORICUM ACIDUM 2X VAGINAL SUPPOSITORY
Freq: Every day | VAGINAL | 0 refills | 3 days | Status: CP
Start: 2021-05-08 — End: 2021-05-11

## 2021-05-08 MED FILL — FLUTICASONE PROPIONATE 50 MCG/ACTUATION NASAL SPRAY,SUSPENSION: NASAL | 60 days supply | Qty: 16 | Fill #2

## 2021-05-08 MED FILL — METFORMIN ER 500 MG TABLET,EXTENDED RELEASE 24 HR: ORAL | 30 days supply | Qty: 120 | Fill #3

## 2021-05-08 MED FILL — PREMARIN 0.625 MG/GRAM VAGINAL CREAM: VAGINAL | 60 days supply | Qty: 30 | Fill #3

## 2021-05-08 MED FILL — PRAVASTATIN 40 MG TABLET: ORAL | 90 days supply | Qty: 90 | Fill #1

## 2021-05-11 MED ORDER — HYLAFEM BORICUM ACIDUM 2X VAGINAL SUPPOSITORY
Freq: Every day | VAGINAL | 0 refills | 3 days | Status: CP
Start: 2021-05-11 — End: 2021-05-14

## 2021-05-14 DIAGNOSIS — N760 Acute vaginitis: Principal | ICD-10-CM

## 2021-05-14 MED ORDER — METRONIDAZOLE 500 MG TABLET
ORAL_TABLET | Freq: Two times a day (BID) | ORAL | 0 refills | 7.00000 days | Status: CP
Start: 2021-05-14 — End: 2021-05-14
  Filled 2021-06-04: qty 14, 7d supply, fill #0

## 2021-05-20 MED FILL — OZEMPIC 2 MG/DOSE (8 MG/3 ML) SUBCUTANEOUS PEN INJECTOR: SUBCUTANEOUS | 28 days supply | Qty: 3 | Fill #2

## 2021-05-20 MED FILL — SPIRONOLACTONE 100 MG TABLET: ORAL | 90 days supply | Qty: 180 | Fill #1

## 2021-05-27 ENCOUNTER — Institutional Professional Consult (permissible substitution)
Admit: 2021-05-27 | Discharge: 2021-05-28 | Payer: BLUE CROSS/BLUE SHIELD | Attending: Ambulatory Care | Primary: Ambulatory Care

## 2021-05-27 DIAGNOSIS — E669 Obesity, unspecified: Principal | ICD-10-CM

## 2021-05-27 MED ORDER — OZEMPIC 2 MG/DOSE (8 MG/3 ML) SUBCUTANEOUS PEN INJECTOR
SUBCUTANEOUS | 6 refills | 28 days | Status: CP
Start: 2021-05-27 — End: ?
  Filled 2021-06-22: qty 3, 28d supply, fill #0

## 2021-06-04 DIAGNOSIS — E039 Hypothyroidism, unspecified: Principal | ICD-10-CM

## 2021-06-04 MED ORDER — LEVOTHYROXINE 50 MCG TABLET
ORAL_TABLET | Freq: Every day | ORAL | 3 refills | 90 days | Status: CP
Start: 2021-06-04 — End: ?
  Filled 2021-06-08: qty 90, 90d supply, fill #0

## 2021-06-04 MED FILL — LOSARTAN 25 MG TABLET: ORAL | 90 days supply | Qty: 90 | Fill #1

## 2021-06-04 MED FILL — VENTOLIN HFA 90 MCG/ACTUATION AEROSOL INHALER: RESPIRATORY_TRACT | 28 days supply | Qty: 18 | Fill #0

## 2021-06-04 MED FILL — PREMARIN 0.3 MG TABLET: ORAL | 84 days supply | Qty: 63 | Fill #1

## 2021-06-04 MED FILL — PANTOPRAZOLE 40 MG TABLET,DELAYED RELEASE: ORAL | 45 days supply | Qty: 90 | Fill #1

## 2021-06-04 MED FILL — CELECOXIB 200 MG CAPSULE: ORAL | 90 days supply | Qty: 180 | Fill #2

## 2021-06-04 MED FILL — METFORMIN ER 500 MG TABLET,EXTENDED RELEASE 24 HR: ORAL | 30 days supply | Qty: 120 | Fill #4

## 2021-06-08 MED FILL — MINOXIDIL 2.5 MG TABLET: ORAL | 90 days supply | Qty: 45 | Fill #1

## 2021-07-01 ENCOUNTER — Ambulatory Visit: Admit: 2021-07-01 | Discharge: 2021-07-01 | Payer: BLUE CROSS/BLUE SHIELD

## 2021-07-01 ENCOUNTER — Ambulatory Visit
Admit: 2021-07-01 | Discharge: 2021-07-01 | Payer: BLUE CROSS/BLUE SHIELD | Attending: Student in an Organized Health Care Education/Training Program | Primary: Student in an Organized Health Care Education/Training Program

## 2021-07-01 DIAGNOSIS — L658 Other specified nonscarring hair loss: Principal | ICD-10-CM

## 2021-07-01 DIAGNOSIS — L720 Epidermal cyst: Principal | ICD-10-CM

## 2021-07-01 MED ORDER — SPIRONOLACTONE 100 MG TABLET
ORAL_TABLET | Freq: Two times a day (BID) | ORAL | 1 refills | 90 days | Status: CP
Start: 2021-07-01 — End: 2021-12-28
  Filled 2021-08-19: qty 180, 90d supply, fill #0

## 2021-07-01 MED ORDER — MINOXIDIL 2.5 MG TABLET
ORAL_TABLET | Freq: Every day | ORAL | 3 refills | 90 days | Status: CP
Start: 2021-07-01 — End: 2022-07-01
  Filled 2021-09-22: qty 45, 90d supply, fill #0

## 2021-07-08 ENCOUNTER — Ambulatory Visit
Admit: 2021-07-08 | Discharge: 2021-07-09 | Payer: BLUE CROSS/BLUE SHIELD | Attending: Physical Medicine & Rehabilitation | Primary: Physical Medicine & Rehabilitation

## 2021-07-08 DIAGNOSIS — M479 Spondylosis, unspecified: Principal | ICD-10-CM

## 2021-07-08 DIAGNOSIS — G8929 Other chronic pain: Principal | ICD-10-CM

## 2021-07-08 DIAGNOSIS — M533 Sacrococcygeal disorders, not elsewhere classified: Principal | ICD-10-CM

## 2021-07-09 MED FILL — METFORMIN ER 500 MG TABLET,EXTENDED RELEASE 24 HR: ORAL | 30 days supply | Qty: 120 | Fill #5

## 2021-07-15 MED FILL — OZEMPIC 2 MG/DOSE (8 MG/3 ML) SUBCUTANEOUS PEN INJECTOR: SUBCUTANEOUS | 28 days supply | Qty: 3 | Fill #1

## 2021-07-28 DIAGNOSIS — M479 Spondylosis, unspecified: Principal | ICD-10-CM

## 2021-07-29 ENCOUNTER — Ambulatory Visit: Admit: 2021-07-29 | Discharge: 2021-07-30 | Payer: BLUE CROSS/BLUE SHIELD | Attending: Family | Primary: Family

## 2021-07-31 ENCOUNTER — Ambulatory Visit: Admit: 2021-07-31 | Discharge: 2021-08-01 | Payer: BLUE CROSS/BLUE SHIELD

## 2021-08-04 DIAGNOSIS — M23341 Other meniscus derangements, anterior horn of lateral meniscus, right knee: Principal | ICD-10-CM

## 2021-08-07 ENCOUNTER — Ambulatory Visit: Admit: 2021-08-07 | Discharge: 2021-08-07 | Payer: BLUE CROSS/BLUE SHIELD

## 2021-08-07 DIAGNOSIS — M23341 Other meniscus derangements, anterior horn of lateral meniscus, right knee: Principal | ICD-10-CM

## 2021-08-11 ENCOUNTER — Ambulatory Visit: Admit: 2021-08-11 | Payer: BLUE CROSS/BLUE SHIELD

## 2021-08-12 MED ORDER — METFORMIN ER 500 MG TABLET,EXTENDED RELEASE 24 HR
ORAL_TABLET | Freq: Every day | ORAL | 5 refills | 30 days | Status: CP
Start: 2021-08-12 — End: ?
  Filled 2021-08-13: qty 120, 30d supply, fill #0

## 2021-08-12 MED FILL — OZEMPIC 2 MG/DOSE (8 MG/3 ML) SUBCUTANEOUS PEN INJECTOR: SUBCUTANEOUS | 28 days supply | Qty: 3 | Fill #2

## 2021-08-18 MED ORDER — PRAVASTATIN 40 MG TABLET
ORAL_TABLET | Freq: Every evening | ORAL | 1 refills | 90 days | Status: CP
Start: 2021-08-18 — End: 2022-08-18
  Filled 2021-08-19: qty 90, 90d supply, fill #0

## 2021-08-19 MED FILL — PREMARIN 0.3 MG TABLET: ORAL | 54 days supply | Qty: 54 | Fill #2

## 2021-08-19 MED FILL — PREMARIN 0.625 MG/GRAM VAGINAL CREAM: VAGINAL | 60 days supply | Qty: 30 | Fill #4

## 2021-08-31 MED FILL — CELECOXIB 200 MG CAPSULE: ORAL | 90 days supply | Qty: 180 | Fill #3

## 2021-09-08 MED ORDER — LOSARTAN 25 MG TABLET
ORAL_TABLET | Freq: Every day | ORAL | 1 refills | 90 days | Status: CP
Start: 2021-09-08 — End: 2022-09-08
  Filled 2021-09-09: qty 90, 90d supply, fill #0

## 2021-09-09 ENCOUNTER — Ambulatory Visit: Admit: 2021-09-09 | Discharge: 2021-09-09 | Payer: BLUE CROSS/BLUE SHIELD

## 2021-09-09 ENCOUNTER — Ambulatory Visit
Admit: 2021-09-09 | Discharge: 2021-09-09 | Payer: BLUE CROSS/BLUE SHIELD | Attending: Student in an Organized Health Care Education/Training Program | Primary: Student in an Organized Health Care Education/Training Program

## 2021-09-09 DIAGNOSIS — E785 Hyperlipidemia, unspecified: Principal | ICD-10-CM

## 2021-09-09 DIAGNOSIS — E669 Obesity, unspecified: Principal | ICD-10-CM

## 2021-09-09 MED FILL — SYNTHROID 50 MCG TABLET: ORAL | 90 days supply | Qty: 90 | Fill #1

## 2021-09-09 MED FILL — METFORMIN ER 500 MG TABLET,EXTENDED RELEASE 24 HR: ORAL | 30 days supply | Qty: 120 | Fill #1

## 2021-09-09 MED FILL — OZEMPIC 2 MG/DOSE (8 MG/3 ML) SUBCUTANEOUS PEN INJECTOR: SUBCUTANEOUS | 28 days supply | Qty: 3 | Fill #3

## 2021-09-15 ENCOUNTER — Ambulatory Visit: Admit: 2021-09-15 | Discharge: 2021-09-16 | Payer: BLUE CROSS/BLUE SHIELD

## 2021-09-15 ENCOUNTER — Ambulatory Visit: Admit: 2021-09-15 | Discharge: 2021-09-15 | Payer: BLUE CROSS/BLUE SHIELD

## 2021-09-15 DIAGNOSIS — Z9889 Other specified postprocedural states: Principal | ICD-10-CM

## 2021-09-15 MED ORDER — OXYCODONE 5 MG TABLET
ORAL_TABLET | ORAL | 0 refills | 4 days | Status: CP | PRN
Start: 2021-09-15 — End: ?

## 2021-09-15 MED ORDER — DOCUSATE SODIUM 100 MG CAPSULE
ORAL_CAPSULE | Freq: Two times a day (BID) | ORAL | 0 refills | 14 days | Status: CP
Start: 2021-09-15 — End: 2021-09-29

## 2021-09-15 MED ORDER — GABAPENTIN 100 MG CAPSULE
ORAL_CAPSULE | Freq: Three times a day (TID) | ORAL | 0 refills | 5 days | Status: CP
Start: 2021-09-15 — End: 2021-09-20

## 2021-09-15 MED ORDER — ONDANSETRON HCL 4 MG TABLET
ORAL_TABLET | Freq: Three times a day (TID) | ORAL | 0 refills | 4 days | Status: CP | PRN
Start: 2021-09-15 — End: ?

## 2021-09-15 MED ORDER — ASPIRIN 81 MG CHEWABLE TABLET
ORAL_TABLET | Freq: Every day | ORAL | 0 refills | 30 days | Status: CP
Start: 2021-09-15 — End: 2021-10-15

## 2021-09-15 MED ORDER — HYDROCODONE 5 MG-ACETAMINOPHEN 325 MG TABLET
ORAL_TABLET | Freq: Four times a day (QID) | ORAL | 0 refills | 4 days | Status: CP | PRN
Start: 2021-09-15 — End: 2021-09-15

## 2021-09-23 ENCOUNTER — Ambulatory Visit: Admit: 2021-09-23 | Discharge: 2021-09-24 | Payer: BLUE CROSS/BLUE SHIELD

## 2021-10-01 DIAGNOSIS — N951 Menopausal and female climacteric states: Principal | ICD-10-CM

## 2021-10-01 DIAGNOSIS — Z9889 Other specified postprocedural states: Principal | ICD-10-CM

## 2021-10-01 MED ORDER — PREMARIN 0.625 MG/GRAM VAGINAL CREAM
Freq: Every day | VAGINAL | 11 refills | 60 days | Status: CP
Start: 2021-10-01 — End: 2022-10-01
  Filled 2021-10-05: qty 30, 60d supply, fill #0

## 2021-10-05 ENCOUNTER — Ambulatory Visit: Admit: 2021-10-05 | Discharge: 2021-10-06 | Payer: BLUE CROSS/BLUE SHIELD

## 2021-10-05 MED FILL — OZEMPIC 2 MG/DOSE (8 MG/3 ML) SUBCUTANEOUS PEN INJECTOR: SUBCUTANEOUS | 28 days supply | Qty: 3 | Fill #4

## 2021-10-05 MED FILL — METFORMIN ER 500 MG TABLET,EXTENDED RELEASE 24 HR: ORAL | 30 days supply | Qty: 120 | Fill #2

## 2021-10-14 MED FILL — MINOXIDIL 2.5 MG TABLET: ORAL | 90 days supply | Qty: 45 | Fill #1

## 2021-10-16 ENCOUNTER — Ambulatory Visit
Admit: 2021-10-16 | Discharge: 2021-10-17 | Payer: BLUE CROSS/BLUE SHIELD | Attending: Physical Medicine & Rehabilitation | Primary: Physical Medicine & Rehabilitation

## 2021-10-16 DIAGNOSIS — M533 Sacrococcygeal disorders, not elsewhere classified: Principal | ICD-10-CM

## 2021-10-16 DIAGNOSIS — G8929 Other chronic pain: Principal | ICD-10-CM

## 2021-10-19 ENCOUNTER — Ambulatory Visit: Admit: 2021-10-19 | Discharge: 2021-10-20 | Payer: BLUE CROSS/BLUE SHIELD

## 2021-10-20 DIAGNOSIS — J4520 Mild intermittent asthma, uncomplicated: Principal | ICD-10-CM

## 2021-10-20 DIAGNOSIS — N951 Menopausal and female climacteric states: Principal | ICD-10-CM

## 2021-10-20 DIAGNOSIS — M1990 Unspecified osteoarthritis, unspecified site: Principal | ICD-10-CM

## 2021-10-20 MED ORDER — ALBUTEROL SULFATE HFA 90 MCG/ACTUATION AEROSOL INHALER
Freq: Four times a day (QID) | RESPIRATORY_TRACT | 1 refills | 0 days | Status: CP | PRN
Start: 2021-10-20 — End: 2022-10-20
  Filled 2021-10-22: qty 36, 50d supply, fill #0

## 2021-10-22 MED ORDER — CONJUGATED ESTROGENS 0.3 MG TABLET
ORAL_TABLET | Freq: Every day | ORAL | 1 refills | 90 days | Status: CP
Start: 2021-10-22 — End: 2022-10-22
  Filled 2021-10-22: qty 63, 84d supply, fill #0

## 2021-10-22 MED ORDER — CELECOXIB 200 MG CAPSULE
ORAL_CAPSULE | Freq: Two times a day (BID) | ORAL | 3 refills | 90 days | Status: CP
Start: 2021-10-22 — End: ?
  Filled 2021-11-10: qty 180, 90d supply, fill #0

## 2021-10-24 ENCOUNTER — Ambulatory Visit: Admit: 2021-10-24 | Discharge: 2021-10-25 | Payer: BLUE CROSS/BLUE SHIELD

## 2021-10-24 DIAGNOSIS — H9202 Otalgia, left ear: Principal | ICD-10-CM

## 2021-10-24 MED ORDER — AMOXICILLIN 875 MG TABLET
ORAL_TABLET | Freq: Two times a day (BID) | ORAL | 0 refills | 10 days | Status: CP
Start: 2021-10-24 — End: 2021-11-03

## 2021-10-28 ENCOUNTER — Ambulatory Visit: Admit: 2021-10-28 | Discharge: 2021-10-29 | Payer: BLUE CROSS/BLUE SHIELD | Attending: Family | Primary: Family

## 2021-10-28 DIAGNOSIS — E669 Obesity, unspecified: Principal | ICD-10-CM

## 2021-10-28 DIAGNOSIS — R7303 Prediabetes: Principal | ICD-10-CM

## 2021-10-28 DIAGNOSIS — Z6834 Body mass index (BMI) 34.0-34.9, adult: Principal | ICD-10-CM

## 2021-10-28 DIAGNOSIS — R7989 Other specified abnormal findings of blood chemistry: Principal | ICD-10-CM

## 2021-10-28 MED ORDER — CALCIUM CITRATE 315 MG CALCIUM-VITAMIN D3 6.25 MCG (250 UNIT) TABLET
ORAL_TABLET | Freq: Two times a day (BID) | ORAL | 1 refills | 30 days | Status: CP
Start: 2021-10-28 — End: ?
  Filled 2021-10-29: qty 60, 30d supply, fill #0

## 2021-11-03 MED FILL — METFORMIN ER 500 MG TABLET,EXTENDED RELEASE 24 HR: ORAL | 30 days supply | Qty: 120 | Fill #3

## 2021-11-03 MED FILL — OZEMPIC 2 MG/DOSE (8 MG/3 ML) SUBCUTANEOUS PEN INJECTOR: SUBCUTANEOUS | 28 days supply | Qty: 3 | Fill #5

## 2021-11-04 ENCOUNTER — Ambulatory Visit
Admit: 2021-11-04 | Discharge: 2021-11-05 | Payer: BLUE CROSS/BLUE SHIELD | Attending: Student in an Organized Health Care Education/Training Program | Primary: Student in an Organized Health Care Education/Training Program

## 2021-11-04 DIAGNOSIS — L658 Other specified nonscarring hair loss: Principal | ICD-10-CM

## 2021-11-04 MED ORDER — TRETINOIN 0.05 % TOPICAL CREAM
4 refills | 0 days | Status: CP
Start: 2021-11-04 — End: ?
  Filled 2021-12-22: qty 45, 14d supply, fill #0

## 2021-11-04 MED ORDER — MINOXIDIL 2.5 MG TABLET
ORAL_TABLET | Freq: Every day | ORAL | 3 refills | 90.00000 days | Status: CP
Start: 2021-11-04 — End: 2021-11-04
  Filled 2022-01-07: qty 45, 90d supply, fill #0

## 2021-11-24 MED FILL — PRAVASTATIN 40 MG TABLET: ORAL | 90 days supply | Qty: 90 | Fill #1

## 2021-11-24 MED FILL — SPIRONOLACTONE 100 MG TABLET: ORAL | 90 days supply | Qty: 180 | Fill #1

## 2021-12-02 MED FILL — OZEMPIC 2 MG/DOSE (8 MG/3 ML) SUBCUTANEOUS PEN INJECTOR: SUBCUTANEOUS | 28 days supply | Qty: 3 | Fill #6

## 2021-12-02 MED FILL — METFORMIN ER 500 MG TABLET,EXTENDED RELEASE 24 HR: ORAL | 30 days supply | Qty: 120 | Fill #4

## 2021-12-02 MED FILL — PREMARIN 0.625 MG/GRAM VAGINAL CREAM: VAGINAL | 60 days supply | Qty: 30 | Fill #1

## 2021-12-02 MED FILL — SYNTHROID 50 MCG TABLET: ORAL | 90 days supply | Qty: 90 | Fill #2

## 2021-12-03 ENCOUNTER — Ambulatory Visit: Admit: 2021-12-03 | Discharge: 2021-12-04 | Payer: BLUE CROSS/BLUE SHIELD

## 2021-12-03 DIAGNOSIS — K219 Gastro-esophageal reflux disease without esophagitis: Principal | ICD-10-CM

## 2021-12-03 DIAGNOSIS — I959 Hypotension, unspecified: Principal | ICD-10-CM

## 2021-12-03 MED ORDER — PANTOPRAZOLE 40 MG TABLET,DELAYED RELEASE
ORAL_TABLET | Freq: Two times a day (BID) | ORAL | 1 refills | 90.00000 days | Status: CP
Start: 2021-12-03 — End: 2022-12-03

## 2022-01-01 ENCOUNTER — Ambulatory Visit: Admit: 2022-01-01 | Discharge: 2022-01-02 | Payer: BLUE CROSS/BLUE SHIELD

## 2022-01-04 MED ORDER — OZEMPIC 2 MG/DOSE (8 MG/3 ML) SUBCUTANEOUS PEN INJECTOR
SUBCUTANEOUS | 0 refills | 28 days | Status: CP
Start: 2022-01-04 — End: ?

## 2022-01-05 MED FILL — PREMARIN 0.3 MG TABLET: ORAL | 84 days supply | Qty: 63 | Fill #1

## 2022-01-05 MED FILL — METFORMIN ER 500 MG TABLET,EXTENDED RELEASE 24 HR: ORAL | 30 days supply | Qty: 120 | Fill #5

## 2022-01-14 MED ORDER — SEMAGLUTIDE 1 MG/DOSE (4 MG/3 ML) SUBCUTANEOUS PEN INJECTOR
SUBCUTANEOUS | 3 refills | 28 days | Status: CP
Start: 2022-01-14 — End: ?
  Filled 2022-01-14: qty 3, 28d supply, fill #0

## 2022-01-19 ENCOUNTER — Ambulatory Visit
Admit: 2022-01-19 | Discharge: 2022-01-20 | Payer: BLUE CROSS/BLUE SHIELD | Attending: Ambulatory Care | Primary: Ambulatory Care

## 2022-01-19 MED ORDER — MOUNJARO 5 MG/0.5 ML SUBCUTANEOUS PEN INJECTOR
SUBCUTANEOUS | 1 refills | 0 days | Status: CP
Start: 2022-01-19 — End: ?
  Filled 2022-01-19: qty 2, 28d supply, fill #0

## 2022-02-04 MED ORDER — METFORMIN ER 500 MG TABLET,EXTENDED RELEASE 24 HR
ORAL_TABLET | Freq: Every day | ORAL | 5 refills | 30 days | Status: CP
Start: 2022-02-04 — End: ?
  Filled 2022-02-08: qty 120, 30d supply, fill #0

## 2022-02-08 MED FILL — CELECOXIB 200 MG CAPSULE: ORAL | 90 days supply | Qty: 180 | Fill #1

## 2022-02-08 MED FILL — RETIN-A 0.05 % TOPICAL CREAM: 14 days supply | Qty: 45 | Fill #1

## 2022-02-15 DIAGNOSIS — J4520 Mild intermittent asthma, uncomplicated: Principal | ICD-10-CM

## 2022-02-15 MED ORDER — ALBUTEROL SULFATE HFA 90 MCG/ACTUATION AEROSOL INHALER
Freq: Four times a day (QID) | RESPIRATORY_TRACT | 1 refills | 0 days | Status: CP | PRN
Start: 2022-02-15 — End: 2023-02-15
  Filled 2022-02-17: qty 54, 75d supply, fill #0

## 2022-02-15 MED ORDER — PRAVASTATIN 40 MG TABLET
ORAL_TABLET | Freq: Every evening | ORAL | 1 refills | 90 days | Status: CP
Start: 2022-02-15 — End: 2023-02-15
  Filled 2022-02-17: qty 90, 90d supply, fill #0

## 2022-02-17 ENCOUNTER — Ambulatory Visit: Admit: 2022-02-17 | Payer: BLUE CROSS/BLUE SHIELD

## 2022-02-17 ENCOUNTER — Ambulatory Visit
Admit: 2022-02-17 | Discharge: 2022-02-18 | Payer: BLUE CROSS/BLUE SHIELD | Attending: Physical Medicine & Rehabilitation | Primary: Physical Medicine & Rehabilitation

## 2022-02-17 ENCOUNTER — Ambulatory Visit: Admit: 2022-02-17 | Discharge: 2022-03-18 | Payer: BLUE CROSS/BLUE SHIELD

## 2022-02-17 DIAGNOSIS — M533 Sacrococcygeal disorders, not elsewhere classified: Principal | ICD-10-CM

## 2022-02-17 DIAGNOSIS — G8929 Other chronic pain: Principal | ICD-10-CM

## 2022-02-17 MED FILL — MOUNJARO 5 MG/0.5 ML SUBCUTANEOUS PEN INJECTOR: SUBCUTANEOUS | 28 days supply | Qty: 2 | Fill #0

## 2022-02-17 MED FILL — PREMARIN 0.625 MG/GRAM VAGINAL CREAM: VAGINAL | 60 days supply | Qty: 30 | Fill #2

## 2022-02-25 DIAGNOSIS — L658 Other specified nonscarring hair loss: Principal | ICD-10-CM

## 2022-02-25 MED ORDER — SPIRONOLACTONE 100 MG TABLET
ORAL_TABLET | Freq: Two times a day (BID) | ORAL | 1 refills | 90 days | Status: CP
Start: 2022-02-25 — End: 2022-08-24
  Filled 2022-03-01: qty 180, 90d supply, fill #0

## 2022-03-01 MED FILL — RETIN-A 0.05 % TOPICAL CREAM: 14 days supply | Qty: 45 | Fill #2

## 2022-03-01 MED FILL — SYNTHROID 50 MCG TABLET: ORAL | 90 days supply | Qty: 90 | Fill #3

## 2022-03-02 ENCOUNTER — Ambulatory Visit
Admit: 2022-03-02 | Discharge: 2022-03-03 | Payer: BLUE CROSS/BLUE SHIELD | Attending: Ambulatory Care | Primary: Ambulatory Care

## 2022-03-02 DIAGNOSIS — E669 Obesity, unspecified: Principal | ICD-10-CM

## 2022-03-02 MED ORDER — MOUNJARO 7.5 MG/0.5 ML SUBCUTANEOUS PEN INJECTOR
SUBCUTANEOUS | 1 refills | 0 days | Status: CP
Start: 2022-03-02 — End: ?
  Filled 2022-03-10: qty 2, 28d supply, fill #0

## 2022-03-08 ENCOUNTER — Ambulatory Visit: Admit: 2022-03-08 | Discharge: 2022-03-09 | Payer: BLUE CROSS/BLUE SHIELD

## 2022-03-10 MED FILL — METFORMIN ER 500 MG TABLET,EXTENDED RELEASE 24 HR: ORAL | 75 days supply | Qty: 300 | Fill #1

## 2022-03-24 ENCOUNTER — Ambulatory Visit: Admit: 2022-03-24 | Payer: BLUE CROSS/BLUE SHIELD

## 2022-03-24 MED FILL — MINOXIDIL 2.5 MG TABLET: ORAL | 90 days supply | Qty: 45 | Fill #1

## 2022-03-24 MED FILL — RETIN-A 0.05 % TOPICAL CREAM: 30 days supply | Qty: 40 | Fill #3

## 2022-04-06 MED FILL — MOUNJARO 7.5 MG/0.5 ML SUBCUTANEOUS PEN INJECTOR: SUBCUTANEOUS | 28 days supply | Qty: 2 | Fill #1

## 2022-04-06 MED FILL — PREMARIN 0.3 MG TABLET: ORAL | 54 days supply | Qty: 54 | Fill #2

## 2022-04-08 DIAGNOSIS — Z1231 Encounter for screening mammogram for malignant neoplasm of breast: Principal | ICD-10-CM

## 2022-04-13 ENCOUNTER — Ambulatory Visit
Admit: 2022-04-13 | Discharge: 2022-04-13 | Payer: BLUE CROSS/BLUE SHIELD | Attending: Ambulatory Care | Primary: Ambulatory Care

## 2022-04-13 ENCOUNTER — Ambulatory Visit: Admit: 2022-04-13 | Discharge: 2022-04-13 | Payer: BLUE CROSS/BLUE SHIELD

## 2022-04-13 DIAGNOSIS — E669 Obesity, unspecified: Principal | ICD-10-CM

## 2022-04-13 MED ORDER — MOUNJARO 10 MG/0.5 ML SUBCUTANEOUS PEN INJECTOR
SUBCUTANEOUS | 1 refills | 0 days | Status: CP
Start: 2022-04-13 — End: ?
  Filled 2022-05-06: qty 2, 28d supply, fill #0

## 2022-04-20 ENCOUNTER — Ambulatory Visit: Admit: 2022-04-20 | Payer: BLUE CROSS/BLUE SHIELD

## 2022-04-21 MED FILL — PREMARIN 0.625 MG/GRAM VAGINAL CREAM: VAGINAL | 60 days supply | Qty: 30 | Fill #3

## 2022-04-22 ENCOUNTER — Ambulatory Visit: Admit: 2022-04-22 | Discharge: 2022-04-23 | Payer: BLUE CROSS/BLUE SHIELD

## 2022-04-22 ENCOUNTER — Ambulatory Visit: Admit: 2022-04-22 | Discharge: 2022-04-23 | Payer: BLUE CROSS/BLUE SHIELD | Attending: Family | Primary: Family

## 2022-04-22 DIAGNOSIS — E7800 Pure hypercholesterolemia, unspecified: Principal | ICD-10-CM

## 2022-04-22 DIAGNOSIS — R7303 Prediabetes: Principal | ICD-10-CM

## 2022-04-22 DIAGNOSIS — E039 Hypothyroidism, unspecified: Principal | ICD-10-CM

## 2022-04-22 DIAGNOSIS — K219 Gastro-esophageal reflux disease without esophagitis: Principal | ICD-10-CM

## 2022-04-22 DIAGNOSIS — Z0001 Encounter for general adult medical examination with abnormal findings: Principal | ICD-10-CM

## 2022-04-22 DIAGNOSIS — I10 Essential (primary) hypertension: Principal | ICD-10-CM

## 2022-04-22 DIAGNOSIS — M1990 Unspecified osteoarthritis, unspecified site: Principal | ICD-10-CM

## 2022-04-22 DIAGNOSIS — N951 Menopausal and female climacteric states: Principal | ICD-10-CM

## 2022-04-22 MED ORDER — PANTOPRAZOLE 40 MG TABLET,DELAYED RELEASE
ORAL_TABLET | Freq: Two times a day (BID) | ORAL | 1 refills | 90 days | Status: CP
Start: 2022-04-22 — End: 2023-04-22

## 2022-04-22 MED ORDER — CELECOXIB 200 MG CAPSULE
ORAL_CAPSULE | Freq: Two times a day (BID) | ORAL | 3 refills | 45 days | Status: CP
Start: 2022-04-22 — End: ?

## 2022-04-22 MED ORDER — CONJUGATED ESTROGENS 0.3 MG TABLET
ORAL_TABLET | Freq: Every day | ORAL | 1 refills | 90 days | Status: CP
Start: 2022-04-22 — End: 2023-04-22

## 2022-04-26 DIAGNOSIS — H9190 Unspecified hearing loss, unspecified ear: Principal | ICD-10-CM

## 2022-04-30 DIAGNOSIS — M1990 Unspecified osteoarthritis, unspecified site: Principal | ICD-10-CM

## 2022-04-30 DIAGNOSIS — K219 Gastro-esophageal reflux disease without esophagitis: Principal | ICD-10-CM

## 2022-05-05 DIAGNOSIS — M1990 Unspecified osteoarthritis, unspecified site: Principal | ICD-10-CM

## 2022-05-05 DIAGNOSIS — N951 Menopausal and female climacteric states: Principal | ICD-10-CM

## 2022-05-05 MED ORDER — CONJUGATED ESTROGENS 0.3 MG TABLET
ORAL_TABLET | Freq: Every day | ORAL | 1 refills | 90 days | Status: CP
Start: 2022-05-05 — End: 2023-05-05
  Filled 2022-05-24: qty 90, 90d supply, fill #0

## 2022-05-05 MED ORDER — CELECOXIB 200 MG CAPSULE
ORAL_CAPSULE | Freq: Two times a day (BID) | ORAL | 3 refills | 90 days | Status: CP
Start: 2022-05-05 — End: 2023-05-05
  Filled 2022-05-24: qty 180, 90d supply, fill #0

## 2022-05-06 DIAGNOSIS — H9190 Unspecified hearing loss, unspecified ear: Principal | ICD-10-CM

## 2022-05-06 MED FILL — VENTOLIN HFA 90 MCG/ACTUATION AEROSOL INHALER: RESPIRATORY_TRACT | 75 days supply | Qty: 54 | Fill #1

## 2022-05-07 MED FILL — RETIN-A 0.05 % TOPICAL CREAM: 30 days supply | Qty: 40 | Fill #4

## 2022-05-20 DIAGNOSIS — J4520 Mild intermittent asthma, uncomplicated: Principal | ICD-10-CM

## 2022-05-21 MED ORDER — ALBUTEROL SULFATE HFA 90 MCG/ACTUATION AEROSOL INHALER
Freq: Four times a day (QID) | RESPIRATORY_TRACT | 1 refills | 75 days | Status: CP | PRN
Start: 2022-05-21 — End: 2023-05-21
  Filled 2022-08-19: qty 54, 75d supply, fill #0

## 2022-05-24 MED FILL — METFORMIN ER 500 MG TABLET,EXTENDED RELEASE 24 HR: ORAL | 75 days supply | Qty: 300 | Fill #2

## 2022-05-24 MED FILL — PRAVASTATIN 40 MG TABLET: ORAL | 90 days supply | Qty: 90 | Fill #1

## 2022-05-26 ENCOUNTER — Ambulatory Visit
Admit: 2022-05-26 | Discharge: 2022-05-27 | Payer: BLUE CROSS/BLUE SHIELD | Attending: Student in an Organized Health Care Education/Training Program | Primary: Student in an Organized Health Care Education/Training Program

## 2022-05-26 DIAGNOSIS — I10 Essential (primary) hypertension: Principal | ICD-10-CM

## 2022-05-26 DIAGNOSIS — Z6841 Body Mass Index (BMI) 40.0 and over, adult: Principal | ICD-10-CM

## 2022-05-26 DIAGNOSIS — E669 Obesity, unspecified: Principal | ICD-10-CM

## 2022-05-26 MED ORDER — MOUNJARO 10 MG/0.5 ML SUBCUTANEOUS PEN INJECTOR
SUBCUTANEOUS | 3 refills | 28 days | Status: CP
Start: 2022-05-26 — End: ?

## 2022-05-31 ENCOUNTER — Ambulatory Visit: Admit: 2022-05-31 | Discharge: 2022-06-01 | Payer: BLUE CROSS/BLUE SHIELD

## 2022-06-01 MED FILL — SPIRONOLACTONE 100 MG TABLET: ORAL | 90 days supply | Qty: 180 | Fill #1

## 2022-06-03 ENCOUNTER — Ambulatory Visit: Admit: 2022-06-03 | Discharge: 2022-06-16 | Payer: BLUE CROSS/BLUE SHIELD

## 2022-06-03 ENCOUNTER — Ambulatory Visit: Admit: 2022-06-03 | Payer: BLUE CROSS/BLUE SHIELD

## 2022-06-04 ENCOUNTER — Ambulatory Visit: Admit: 2022-06-04 | Discharge: 2022-06-05 | Payer: BLUE CROSS/BLUE SHIELD

## 2022-06-04 MED ORDER — MOUNJARO 12.5 MG/0.5 ML SUBCUTANEOUS PEN INJECTOR
3 refills | 0 days | Status: CP
Start: 2022-06-04 — End: ?

## 2022-06-04 MED ORDER — DICLOFENAC 1.5 % TOPICAL DROPS
Freq: Four times a day (QID) | TOPICAL | 2 refills | 0 days | Status: CP | PRN
Start: 2022-06-04 — End: ?
  Filled 2022-06-15: qty 150, 7d supply, fill #0

## 2022-06-16 MED ORDER — MOUNJARO 10 MG/0.5 ML SUBCUTANEOUS PEN INJECTOR
SUBCUTANEOUS | 3 refills | 28 days | Status: CP
Start: 2022-06-16 — End: ?
  Filled 2022-06-28: qty 2, 28d supply, fill #0

## 2022-06-24 ENCOUNTER — Ambulatory Visit: Admit: 2022-06-24 | Payer: BLUE CROSS/BLUE SHIELD

## 2022-06-26 DIAGNOSIS — E039 Hypothyroidism, unspecified: Principal | ICD-10-CM

## 2022-06-28 MED ORDER — TRETINOIN 0.05 % TOPICAL CREAM
4 refills | 0.00000 days | Status: CP
Start: 2022-06-28 — End: ?
  Filled 2022-07-02: qty 45, 22d supply, fill #0

## 2022-06-28 MED ORDER — LEVOTHYROXINE 50 MCG TABLET
ORAL_TABLET | Freq: Every day | ORAL | 1 refills | 90 days | Status: CP
Start: 2022-06-28 — End: 2023-06-28
  Filled 2022-07-02: qty 90, 90d supply, fill #0

## 2022-06-29 ENCOUNTER — Ambulatory Visit: Admit: 2022-06-29 | Discharge: 2022-06-30 | Payer: BLUE CROSS/BLUE SHIELD

## 2022-07-02 MED FILL — PREMARIN 0.625 MG/GRAM VAGINAL CREAM: VAGINAL | 60 days supply | Qty: 30 | Fill #4

## 2022-07-07 MED FILL — MOUNJARO 12.5 MG/0.5 ML SUBCUTANEOUS PEN INJECTOR: SUBCUTANEOUS | 28 days supply | Qty: 2 | Fill #0

## 2022-07-08 MED FILL — MINOXIDIL 2.5 MG TABLET: ORAL | 90 days supply | Qty: 45 | Fill #2

## 2022-07-13 ENCOUNTER — Ambulatory Visit
Admit: 2022-07-13 | Discharge: 2022-07-13 | Payer: BLUE CROSS/BLUE SHIELD | Attending: Ambulatory Care | Primary: Ambulatory Care

## 2022-07-13 ENCOUNTER — Ambulatory Visit: Admit: 2022-07-13 | Discharge: 2022-07-13 | Payer: BLUE CROSS/BLUE SHIELD

## 2022-07-13 DIAGNOSIS — E663 Overweight: Principal | ICD-10-CM

## 2022-07-13 MED ORDER — LANCETS
3 refills | 0 days | Status: CP
Start: 2022-07-13 — End: ?
  Filled 2022-07-26: qty 102, 34d supply, fill #0

## 2022-07-13 MED ORDER — ACCU-CHEK GUIDE TEST STRIPS
ORAL_STRIP | 3 refills | 0 days | Status: CP
Start: 2022-07-13 — End: ?
  Filled 2022-07-26: qty 50, 34d supply, fill #0

## 2022-07-16 ENCOUNTER — Ambulatory Visit
Admit: 2022-07-16 | Discharge: 2022-07-17 | Payer: BLUE CROSS/BLUE SHIELD | Attending: Physical Medicine & Rehabilitation | Primary: Physical Medicine & Rehabilitation

## 2022-07-16 DIAGNOSIS — G8929 Other chronic pain: Principal | ICD-10-CM

## 2022-07-16 DIAGNOSIS — M533 Sacrococcygeal disorders, not elsewhere classified: Principal | ICD-10-CM

## 2022-07-26 MED FILL — MOUNJARO 12.5 MG/0.5 ML SUBCUTANEOUS PEN INJECTOR: SUBCUTANEOUS | 28 days supply | Qty: 2 | Fill #1

## 2022-07-26 MED FILL — RETIN-A 0.05 % TOPICAL CREAM: 22 days supply | Qty: 45 | Fill #1

## 2022-08-17 MED ORDER — METFORMIN ER 500 MG TABLET,EXTENDED RELEASE 24 HR
ORAL_TABLET | Freq: Every day | ORAL | 5 refills | 30 days | Status: CP
Start: 2022-08-17 — End: ?
  Filled 2022-08-19: qty 120, 30d supply, fill #0

## 2022-08-19 MED FILL — CELECOXIB 200 MG CAPSULE: ORAL | 90 days supply | Qty: 180 | Fill #1

## 2022-08-19 MED FILL — RETIN-A 0.05 % TOPICAL CREAM: 22 days supply | Qty: 45 | Fill #2

## 2022-08-20 MED ORDER — PRAVASTATIN 40 MG TABLET
ORAL_TABLET | Freq: Every evening | ORAL | 1 refills | 90 days | Status: CP
Start: 2022-08-20 — End: 2023-08-20
  Filled 2022-08-20: qty 90, 90d supply, fill #0

## 2022-08-23 ENCOUNTER — Ambulatory Visit: Admit: 2022-08-23 | Discharge: 2022-08-24 | Payer: BLUE CROSS/BLUE SHIELD | Attending: Family | Primary: Family

## 2022-08-23 DIAGNOSIS — Z23 Encounter for immunization: Principal | ICD-10-CM

## 2022-08-23 DIAGNOSIS — G894 Chronic pain syndrome: Principal | ICD-10-CM

## 2022-08-23 DIAGNOSIS — I10 Essential (primary) hypertension: Principal | ICD-10-CM

## 2022-08-23 DIAGNOSIS — E039 Hypothyroidism, unspecified: Principal | ICD-10-CM

## 2022-08-23 DIAGNOSIS — E7800 Pure hypercholesterolemia, unspecified: Principal | ICD-10-CM

## 2022-08-23 DIAGNOSIS — R7303 Prediabetes: Principal | ICD-10-CM

## 2022-08-23 DIAGNOSIS — R7989 Other specified abnormal findings of blood chemistry: Principal | ICD-10-CM

## 2022-08-23 DIAGNOSIS — J4520 Mild intermittent asthma, uncomplicated: Principal | ICD-10-CM

## 2022-08-23 MED ORDER — MONTELUKAST 10 MG TABLET
ORAL_TABLET | Freq: Every day | ORAL | 2 refills | 30 days | Status: CP
Start: 2022-08-23 — End: 2023-08-23
  Filled 2022-08-26: qty 30, 30d supply, fill #0

## 2022-08-25 DIAGNOSIS — L658 Other specified nonscarring hair loss: Principal | ICD-10-CM

## 2022-08-25 DIAGNOSIS — J4520 Mild intermittent asthma, uncomplicated: Principal | ICD-10-CM

## 2022-08-25 MED ORDER — FLUTICASONE PROPIONATE 50 MCG/ACTUATION NASAL SPRAY,SUSPENSION
Freq: Every day | NASAL | 3 refills | 120 days | Status: CP
Start: 2022-08-25 — End: ?
  Filled 2022-08-30: qty 16, 60d supply, fill #0

## 2022-08-26 MED ORDER — SPIRONOLACTONE 100 MG TABLET
ORAL_TABLET | Freq: Two times a day (BID) | ORAL | 1 refills | 90.00000 days | Status: CP
Start: 2022-08-26 — End: 2023-02-22
  Filled 2022-08-30: qty 180, 90d supply, fill #0

## 2022-08-26 MED FILL — PREMARIN 0.625 MG/GRAM VAGINAL CREAM: VAGINAL | 60 days supply | Qty: 30 | Fill #5

## 2022-08-26 MED FILL — MOUNJARO 12.5 MG/0.5 ML SUBCUTANEOUS PEN INJECTOR: SUBCUTANEOUS | 28 days supply | Qty: 2 | Fill #2

## 2022-08-26 MED FILL — PREMARIN 0.3 MG TABLET: ORAL | 90 days supply | Qty: 90 | Fill #1

## 2022-08-26 MED FILL — ACCU-CHEK GUIDE TEST STRIPS: 34 days supply | Qty: 50 | Fill #1

## 2022-08-31 MED FILL — ACCU-CHEK SOFTCLIX LANCETS: 34 days supply | Qty: 100 | Fill #1

## 2022-09-06 DIAGNOSIS — J4520 Mild intermittent asthma, uncomplicated: Principal | ICD-10-CM

## 2022-09-06 MED ORDER — MONTELUKAST 10 MG TABLET
ORAL_TABLET | Freq: Every day | ORAL | 3 refills | 90 days | Status: CP
Start: 2022-09-06 — End: 2023-09-06
  Filled 2022-09-24: qty 90, 90d supply, fill #0

## 2022-09-16 ENCOUNTER — Ambulatory Visit: Admit: 2022-09-16 | Discharge: 2022-09-17 | Payer: BLUE CROSS/BLUE SHIELD

## 2022-09-24 ENCOUNTER — Ambulatory Visit
Admit: 2022-09-24 | Discharge: 2022-09-25 | Payer: BLUE CROSS/BLUE SHIELD | Attending: Ambulatory Care | Primary: Ambulatory Care

## 2022-09-27 MED FILL — DICLOFENAC 1.5 % TOPICAL DROPS: TOPICAL | 7 days supply | Qty: 150 | Fill #1

## 2022-09-27 MED FILL — RETIN-A 0.05 % TOPICAL CREAM: 22 days supply | Qty: 45 | Fill #3

## 2022-09-27 MED FILL — MOUNJARO 12.5 MG/0.5 ML SUBCUTANEOUS PEN INJECTOR: SUBCUTANEOUS | 28 days supply | Qty: 2 | Fill #3

## 2022-09-27 MED FILL — METFORMIN ER 500 MG TABLET,EXTENDED RELEASE 24 HR: ORAL | 90 days supply | Qty: 360 | Fill #1

## 2022-09-27 NOTE — Unmapped (Signed)
[facility] Specialty Medication Onboarding    Specialty Medication: MOUNJARO 12.5 mg/0.5 mL Pnij (tirzepatide)  Prior Authorization: Not Required   Financial Assistance: No - copay  <$25  Final Copay/Day Supply: $4 / 28    Insurance Restrictions: None     Notes to Pharmacist: refill  Credit Card on File: no    The triage team has completed the benefits investigation and has determined that the patient is able to fill this medication at [facility]. Please contact the patient to complete the onboarding or follow up with the prescribing physician as needed.

## 2022-09-28 NOTE — Unmapped (Signed)
The [facility] has reached out to this patient via MyChart to onboard them to our Specialty Lite services for their [facility]. Their medication was last delivered on 09/28/22.They will now receive proactive outreach from the pharmacy team for refills.    Darryl Nestle, PharmD  [facility] Home Delivery Pharmacist

## 2022-09-29 ENCOUNTER — Institutional Professional Consult (permissible substitution): Admit: 2022-09-29 | Discharge: 2022-09-30 | Payer: BLUE CROSS/BLUE SHIELD

## 2022-09-29 MED ORDER — PREDNISONE 20 MG TABLET
ORAL_TABLET | Freq: Every day | ORAL | 0 refills | 5 days | Status: CP
Start: 2022-09-29 — End: 2022-10-04

## 2022-09-29 MED ORDER — AMOXICILLIN 875 MG-POTASSIUM CLAVULANATE 125 MG TABLET
ORAL_TABLET | Freq: Two times a day (BID) | ORAL | 0 refills | 7 days | Status: CP
Start: 2022-09-29 — End: 2022-10-06

## 2022-09-29 NOTE — Unmapped (Signed)
Called and spoke to Patient. Patient c/o congestion, 99.9 fever, cough, and drainage. States her eyes feel like little fire balls in her head. Advised Patient to go to Urgent Care, Patient declined. States she made a video visit with Gene today. Informed Patient that Gene is out of the office today and is not seeing Patients. Spoke with front desk, and was told that they can check with the [facility] location to see if they have any Providers that can do a video visit with her. Informed Patient that front desk is going to call her back to let her know if there's an appointment available in [place] or not. Patient verbalized understanding.

## 2022-09-29 NOTE — Unmapped (Signed)
The below prescriptions were sent in to your pharmacy:  -     amoxicillin-clavulanate (AUGMENTIN) 875-125 mg per tablet; Take 1 tablet by mouth two (2) times a day for 7 days.      -     predniSONE (DELTASONE) 20 MG tablet; Take 2 tablets (40 mg total) by mouth daily for 5 days. To start I the next few days of your asthma symptoms worsen.    Please see your PCP in-person in 3-5 days if your symptoms persist, or go to ER sooner if they worsen (fever, shortness of breath, chest pain, etc.).    Thank you for visiting the [facility].  In the next few days, you will receive a patient experience survey from [employer].   Your feedback is important to [place] in improving the experience and care we deliver to you and your family.   Thank you for taking the time to share your experience.     Please email through My Chart or call the below phone number with any questions.    Thanks!  Michelene Heady, MD    [facility]  Phone: [phone]   Hours: 6am - 10 pm, 7 days a week     MyChart is for NON-URGENT messages.    For EMERGENCIES, please call 911 immediately!

## 2022-09-29 NOTE — Unmapped (Addendum)
-  start Augmentin 875mg  BID x 7 days  -rx sent in for Prednisone 40mg  daily x 5 days to start if needed if asthma worsens in the next few days  -c/w symptomatic treatment  -ED and follow-up precautions given  -recommend follow-up with PCP in 3-5 days if not improving for in-person appointment, or sooner if worsens

## 2022-09-29 NOTE — Unmapped (Signed)
[facility] Encounter  This medical encounter was conducted virtually using [email or web address]  TeleHealth protocols.    Patient ID: Darlene Lucas is a 52 y.o. female who presents by telephone interaction for evaluation.    I have identified myself to the patient and conveyed my credentials to Darlene Lucas.   Patient has signed informed consent on file in medical record.    Present on Phone Call: Is there someone else in the room? No..    Assessment/Plan:    Darlene Lucas was seen today for uri.    Diagnoses and all orders for this visit:    Sinusitis, unspecified chronicity, unspecified location  -     amoxicillin-clavulanate (AUGMENTIN) 875-125 mg per tablet; Take 1 tablet by mouth two (2) times a day for 7 days.    Other orders  -     predniSONE (DELTASONE) 20 MG tablet; Take 2 tablets (40 mg total) by mouth daily for 5 days.       Sinusitis  -start Augmentin 875mg  BID x 7 days  -rx sent in for Prednisone 40mg  daily x 5 days to start if needed if asthma worsens in the next few days  -c/w symptomatic treatment  -ED and follow-up precautions given  -recommend follow-up with PCP in 3-5 days if not improving for in-person appointment, or sooner if worsens      -- Discussed the new prescription noted above, including potential side effects, drug interactions, instructions for taking the medication, and the consequences of not taking it.  -- Patient verbalized an understanding of today's assessment and recommendations, as well as the purpose of ongoing medications.    Follow-up with PCP      Medication adherence and barriers to the treatment plan have been addressed. Opportunities to optimize healthy behaviors have been discussed. Patient / caregiver voiced understanding.              Subjective:     Chief Complaint   Patient presents with    URI       HPI    Darlene Lucas is 52 y.o. presents today in the [employer].    The PCP for this patient is Darlene Lucas, Emogene Morgan, NP.     Symptoms started a few days ago  Prior to that had had issues with her allergies/asthma    Dry ragged cough, low grade temp, headache, ears and post-nasal drip  Using rescue INH yesterday, has not needed today  Taking Robitussin  Just started on Sigluair 45 days ago    Saw her Endo on 09/23/22, was not sick at that time    They have tried    COVID test negative 2 days ago      ROS  Review of Systems     All other ROS per HPI.    I have reviewed the problem list, past medical history, past family history, medications, and allergies and have updated/reconciled them if needed.            Objective:   This visit was not converted from a video visit to a telephone visit for the following reason: Patient preference    As part of this Telephone Visit, no in-person exam was conducted.           The patient reports they are physically located in [place] and is currently: at home. I conducted a phone visit.  I spent 7 minutes on the phone call with the patient on the date of service .

## 2022-10-06 MED FILL — MINOXIDIL 2.5 MG TABLET: ORAL | 90 days supply | Qty: 45 | Fill #3

## 2022-10-06 MED FILL — SYNTHROID 50 MCG TABLET: ORAL | 90 days supply | Qty: 90 | Fill #1

## 2022-10-06 MED FILL — ACCU-CHEK GUIDE TEST STRIPS: 34 days supply | Qty: 50 | Fill #2

## 2022-10-20 NOTE — Unmapped (Unsigned)
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   - Continue spironolactone 100 mg daily (08/2020 K wnl, pt is menopausal). Pt instructed to message me in 6 weeks with an update of how she is tolerating the higher dose, if doing okay will plan to uptitrate to 100 mg BID ***   - Continue oral minoxidil 1.25 mg daily. Risks: do not use in patients with CHF, valve disease, poorly controlled hypertension, pulmonary hypertension. Patient does not have a history of CHF or valve disease. She has well controlled essential hypertension (per documentation by PCP)  - Consider at next appt: 5-alpha reducatase inhibitors (Category X) including finasteride: 1mg , 2.5mg  up to 5mg  /dutasteride 0.5mg  - which would require 2 methods of birth control. ***      Milia  ***   - Discussed the benign nature of the lesion and it's resemblance to a very small cyst.  - Discussed options for treatment to include observational management as these are benign versus extraction versus tretinoin cream. Jointly elected to proceed with the plan below:  - Continue tretinoin 0.05% cream nightly.    There are no diagnoses linked to this encounter.    The patient was advised to call for an appointment should any new, changing, or symptomatic lesions develop.     RTC: No follow-ups on file. or sooner as needed   _________________________________________________________________      Chief Complaint     No chief complaint on file.    HPI     Trang A. Bellizzi is a 52 y.o. female who presents as a returning patient (last seen by Dr. Mingo Amber and Dr. Odis Luster on 11/04/2021) to Dermatology for follow up of alopecia. At last visit, patient was to continue spironolactone 100 mg daily and oral minoxidil 1.25 mg daily for primary alopecia. She was also to continue tretinoin 0.05% cream for milia.    Alopecia  - ***    Milia  - ***     The patient denies any other new or changing lesions or areas of concern.     Pertinent Past Medical History     No history of skin cancer    Family History:   Negative for melanoma    Past Medical History, Family History, Social History, Medication List, Allergies, and Problem List were reviewed in the rooming section of Epic.     ROS: Other than symptoms mentioned in the HPI, no fevers, chills, or other skin complaints    Physical Examination     GENERAL: Well-appearing female in no acute distress, resting comfortably.  NEURO: Alert and oriented, answers questions appropriately  PSYCH: Normal mood and affect  {PE extent:75514}  {PE list:75421}  ***    All areas not commented on are within normal limits or unremarkable    Scribe's Attestation: Gentry Fitz Robynn Pane) Natasha Bence, PA-C obtained and performed the history, physical exam and medical decision making elements that were entered into the chart. Signed by Sharol Harness, Scribe, on ***.    {*** NOTE TO PROVIDER: PLEASE ADD ATTESTATION NOTING YOU AGREE WITH SCRIBE DOCUMENTATION}     (Approved Template 12/03/2019)

## 2022-10-20 NOTE — Unmapped (Incomplete)
For your hair loss,  - Take 1 tablet of spironolactone 100 mg daily.    - Take 1 tablet of minoxidil 1.25 mg daily.    For your milia,  - Apply a pea sized amount of tretinoin cream to your milia every night. OK to mix with moisturizer if experiencing dryness.

## 2022-10-21 ENCOUNTER — Ambulatory Visit
Admit: 2022-10-21 | Discharge: 2022-10-22 | Payer: BLUE CROSS/BLUE SHIELD | Attending: Student in an Organized Health Care Education/Training Program | Primary: Student in an Organized Health Care Education/Training Program

## 2022-10-21 DIAGNOSIS — Z6841 Body Mass Index (BMI) 40.0 and over, adult: Principal | ICD-10-CM

## 2022-10-21 DIAGNOSIS — R7303 Prediabetes: Principal | ICD-10-CM

## 2022-10-21 DIAGNOSIS — I10 Essential (primary) hypertension: Principal | ICD-10-CM

## 2022-10-21 DIAGNOSIS — Z7689 Persons encountering health services in other specified circumstances: Principal | ICD-10-CM

## 2022-10-21 MED ORDER — MOUNJARO 12.5 MG/0.5 ML SUBCUTANEOUS PEN INJECTOR
SUBCUTANEOUS | 6 refills | 28 days | Status: CP
Start: 2022-10-21 — End: ?
  Filled 2022-12-17: qty 2, 28d supply, fill #0

## 2022-10-21 MED ORDER — METFORMIN ER 500 MG TABLET,EXTENDED RELEASE 24 HR
ORAL_TABLET | Freq: Every day | ORAL | 5 refills | 60 days | Status: CP
Start: 2022-10-21 — End: ?

## 2022-10-21 NOTE — Unmapped (Addendum)
It was great seeing you in clinic today! Please see below for an overview of what we discussed:     - Continue Mounjaro at 12.5mg  weekly.  - Decrease Metformin 1g daily. If doing well, decrease further and consider stopping.     OK to measure glucose less frequently given the stability overall of your blood sugar.     If you have labs drawn, you will be notified through the MyChart Portal when they have been reviewed. You will be contacted to schedule appointments for any referrals placed.     If you have any questions or concerns before your next appointment, do not hesitate to reach out via the MyChart Portal.

## 2022-10-21 NOTE — Unmapped (Signed)
[facility] MEDICAL WEIGHT PROGRAM [place]  [address]  [place] [address]  [place] [address]  Phone: [phone]  Fax: [phone]    Weight Management Clinic    Referring Provider:  Johnette Lucas [facility]     PATIENT ID:  [name] is a 52 y.o. female with obesity, BMI is Body mass index is 27.96 kg/m??..     Initial [facility] evaluation: December 2020     ASSESSMENT AND PLAN:   # Obesity without  h/o bariatric surgery complicated by Back pain due to multiple spinal fusions, history of pulmonary embolism, hypothyroidism, asthma, hyperlipidemia, arthritis of right knee, history of recurrent kidney stones.    Treatment Course:  Weight at presentation to [facility]:  67.1 kg (148 lb)  - Weight Loss Goal: 150lbs     Weight Management Plan:     Lab evaluation today:  UTD February 2024     Bariatric Surgery: Patient doesn't meet criteria for bariatric surgery with BMI 40 or more or BMI 35 or more with Co-morbidity (T2DM, OSA, HTN)    Diet Recommendations: Add more fruits and vegetables to meals, Plan ahead for healthy meals, and Reduce/eliminate processed snacks  - Applauded efforts to date and discussed strategies for maintaining adequate lean protein sources.   - Discussed importance of adequate hydration.      Physical Activity / Exercise: adequate PA for age/risk factors,     - Applauded efforts to date with frequent walking and stationary bike use.  - Encouraged engagement in resistance training to maintain weight/muscle mass, patient engaging in PT.     Sleep:    - No acute concerns.      Stress management: Stress level is currently feels that it is manageable   - No acute concerns.        Weight gain causing medications:  N/A      Anti Obesity Medications:   Patient has had significant response to incretin therapy, remains with continued health improvements including recent cessation of anti-hypertensive treatment.   Reviewed today goal to continue descalating treatments as well as limited utility in continued glucose monitoring.     - Continue Tirzepatide at 12.5mg  weekly for maintenance therapy.   OK for 10mg  if 12.5mg  not available.   - Encouraged to continue decreasing Metformin further to 1g daily. Patient reports wish to maintain use while supply of incretin therapy remains challenge.      Problem List Items Addressed This Visit       Prediabetes    BMI 50.0-59.9, adult (CMS-HCC)    Essential hypertension    Encounter for weight management [Z76.89] - Primary     Follow Up Plan:  6 months     Darlene Fothergill, DO  [facility]          Subjective      HPI / Subjective:  [name] is a 52 y.o.  female referred for medical weight management with obesity, Body mass index is 27.96 kg/m??., complicated by back pain due to multiple spinal fusions, history of pulmonary embolism, hypothyroidism, asthma, hyperlipidemia, arthritis of right knee, history of recurrent kidney stones in the past.     Since prior visit she has overall felt well without significant changes in health. She was able to decrease Metformin to 1500mg  daily. Did not have issues or concerns.      Due to shortages she did miss up to 6 weeks of Tirzepatide in April/May  with some increased in appetite.  She is currently taking 12.5mg  weekly per Dr. Marcella Lucas, tolerating well.     Relevant History:  Migraines/HA: no  Palpitations:  no  Hypertension:  no  Just recently stopped losartan.   Glaucoma:  no  Kidney Stones:yes,    Seizures:   no  Gallbladder Disease: no     Gallbladder Surgery:  no  Pancreatitis: no     Gallstone Pancreatitis: no  Personal or family history of Medullary Thyroid Cancer: no    For Women:   Postmenopause: yes    S/p hysterectomy.     Social History:   Nonsmoker, denies recreational drug use  Denies alcohol use    The history section was last reviewed by Darlene Sharper, LPN on Oct 21, 2022.    Patient's Weight Goals:    Provider comments:  Long term weight goal:  150lbs   Non-weight centered goal :improve overall health and improve wellbeing     Treatment of interest: lifestyle  and pharmacologic options.    Weight History:  Starting weight at presentation: 273lbs   Current weight: 67.1 kg (148 lb)    Provider comments:  History of Bariatric Surgery / Procedures: None    Patients description of the chronology of weight gain:   - Normal weight early in life.   - Weight gain following hystrectomy in 2016.   - Weight gain following broken ankle in August 2019    Prior Dieting / Intervention History:  Dieting History  - Using lean protein sources with most meals.   - Recently introduced chickpea pasta.    Beverages: Water only. Occasional unsweet cherry juice.     Physical Activity:  - Walks 1 mile on track per day and rides stationary bike.   - Historically limited by orthopedic injuries, previously in PT.     Sleep:  - No acute issues.    Stress:  - Lost husband in recent years.  - No acute stressors.     Provider comments:  ??  Patient Active Problem List   Diagnosis    Menorrhagia    History of pulmonary embolism    Abnormal finding on thyroid function test    Allergic state    Arthritis    Allergic rhinitis with asthma without status asthmaticus, mild intermittent, uncomplicated    Decreased cortisol level (CMS-HCC)    Cervical intraepithelial neoplasia grade 2    Edema    Diffuse goiter    Back pain    Acquired hypothyroidism    Excessive and frequent menstruation    Premenstrual tension syndrome    Referred by specialist physician    Ulnar neuropathy    Other pulmonary embolism without acute cor pulmonale (CMS-HCC)    Painful orthopaedic hardware (CMS-HCC)    Hyperlipidemia    Prediabetes    Elevated LFTs    BMI 50.0-59.9, adult (CMS-HCC)    Essential hypertension    Chronic pain syndrome    Class 3 obesity (CMS-HCC)    Primary osteoarthritis of both knees    Complex tear of lateral meniscus of right knee as current injury    Chronic pain of right knee    Sinusitis    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 54 g 1    aspirin 81 MG chewable tablet Chew 1 tablet (81 mg total) daily. 30 tablet 0    blood sugar diagnostic (ACCU-CHEK GUIDE TEST STRIPS) Strp Use to check blood sugar once daily as directed 100 strip 3    calcium citrate-vitamin D3 (CITRACAL + D MAXIMUM) 315 mg-6.25 mcg (250 unit) per tablet Take 1 tablet by mouth Two (2) times a day. 60 tablet 1    celecoxib (CELEBREX) 200 MG capsule Take 1 capsule (200 mg total) by mouth Two (2) times a day. May take 1 additional dose as needed for acute pain. 90 capsule 3    celecoxib (CELEBREX) 200 MG capsule Take 1 capsule (200 mg total) by mouth two (2) times a day. 180 capsule 3    conjugated estrogens (PREMARIN) 0.625 mg/gram vaginal cream Insert 0.5 g into the vagina daily. 30 g 11    diclofenac sodium 1.5 % Drop Apply 40 drops topically four (4) times a day as needed (pain). 150 mL 2    eflornithine (VANIQA) 13.9 % cream Apply at night as directed (Patient not taking: Reported on 07/16/2022) 45 g 0    estrogens, conjugated, (PREMARIN) 0.3 MG tablet Take 1 tablet (0.3 mg total) by mouth daily. 90 tablet 1    fluticasone propionate (FLONASE) 50 mcg/actuation nasal spray Use 1 spray in each nostril once daily. 16 g 3    lancets (ACCU-CHEK SOFTCLIX LANCETS) Misc Use to check blood sugar once daily as directed 100 each 3    levothyroxine (SYNTHROID) 50 MCG tablet Take 1 tablet (50 mcg total) by mouth daily. 90 tablet 1    metFORMIN (GLUCOPHAGE-XR) 500 MG 24 hr tablet Take 4 tablets (2,000 mg total) by mouth daily with evening meal. 120 tablet 5    minoxidil (LONITEN) 2.5 MG tablet Take 1/2 tablet (1.25 mg total) by mouth daily. 45 tablet 3    montelukast (SINGULAIR) 10 mg tablet Take 1 tablet (10 mg total) by mouth daily. 90 tablet 3    pantoprazole (PROTONIX) 40 MG tablet Take 1 tablet (40 mg total) by mouth two (2) times a day. 180 tablet 1    pravastatin (PRAVACHOL) 40 MG tablet Take 1 tablet (40 mg total) by mouth nightly. 90 tablet 1    spironolactone (ALDACTONE) 100 MG tablet Take 1 tablet (100 mg total) by mouth two (2) times a day. 180 tablet 1    tirzepatide (MOUNJARO) 10 mg/0.5 mL PnIj Inject 10 mg under the skin every seven (7) days. 2 mL 3    tirzepatide (MOUNJARO) 12.5 mg/0.5 mL PnIj Inject the contents of 1 pen (12.5 mg) under the skin once a week. 2 mL 3    tretinoin (RETIN-A) 0.05 % cream Apply a pea sized amount at night 2-3 times a week x 2 weeks, then every other night for 2 weeks, then nightly. If getting dry, use a facial moisturizer in the morning such as Oil of Olay complete sensitive skin or CereVe AM 45 g 4     No current facility-administered medications for this visit.        Allergies   Allergen Reactions    Iohexol Anaphylaxis      Desc: IVP DYE   OK WITH 13 HR PREP    Propoxyphene Hives     Uncoded Allergy. Allergen: SHELLFISH    Shellfish Containing Products Anaphylaxis     Patient is ok with topical betadine.  Sulfa (Sulfonamide Antibiotics) Hives    Topiramate Other (See Comments)     Other Reaction: OTHER REACTION  Other Reaction: OTHER REACTION  Increase in HR     Previous and current use of anti-obesity medications:  Metformin:   yes, current  Phentermine: no  Topiramate:  yes, previously  Possible tachycardia     Qysmia:      no  Bupropion:  no  Naltrexone:   no     Contrave:    no  Setmelanotide: no      ([place])  Liraglutide:  yes, previously      (Victoza, Saxenda)  Semaglutide:  yes, previously      ([place], [place])  Tirzepatide:  yes, current taking 10mg  weekly x3 weeks.      (Mounjaro)  SGLT-2 inhibitor: no     (Farxiga, [place], [place]...)  Orlistat :no     (Xenical, Alli)  Other agents:  None known       ROS:  12 point ROS negative other than specified above. Objective     OBJECTIVE:    Vital Signs:  BP 110/79 (BP Site: L Arm, BP Position: Sitting, BP Cuff Size: Large)  - Pulse 89  - Resp 16  - Ht 154.9 cm (5' 1)  - Wt 67.1 kg (148 lb)  - LMP 05/03/2017  - BMI 27.96 kg/m??    Wt Readings from Last 5 Encounters:   10/21/22 67.1 kg (148 lb)   09/24/22 67.9 kg (149 lb 9.6 oz)   08/23/22 67 kg (147 lb 12.8 oz)   07/16/22 68 kg (150 lb)   07/13/22 68 kg (150 lb)     Physical Exam:  Gen: alert and oriented, no acute distress  HEENT:      Ocular: non-icteric, non-injected, EOMI    Oral exam : Tongue moist, pink. moderateOP crowding.   NECK: full neck, no cervical LAD.   RESP: CTA bilaterally   CV: RRR, no murmurs appreciated   Abdomen:  Benign, abdominal striae        Moderate  pannus.   Musculoskeletal: ambulatory without help  Extremities:  Peripheral edema trace  Neuro:  No focal deficits.   Skin exam: No acanthosis, No purplish striae  Pscyh: appropriate affect     DATA REVIEW:    Imaging:  N/A      Lab work: reviewed in [facility] and is noted    Hemoglobin A1C   Date Value Ref Range Status   08/23/2022 4.6 (L) 4.8 - 5.6 % Final   04/22/2022 4.6 (L) 4.8 - 5.6 % Final   12/03/2021 4.7 (L) 4.8 - 5.6 % Final   05/16/2014 5.3 4.8 - 6.0 % Final     TSH   Date Value Ref Range Status   08/23/2022 1.817 0.550 - 4.780 uIU/mL Final     ALT   Date Value Ref Range Status   08/23/2022 15 10 - 49 U/L Final   04/22/2022 12 10 - 49 U/L Final   12/03/2021 19 10 - 49 U/L Final     AST   Date Value Ref Range Status   08/23/2022 17 <=34 U/L Final   04/22/2022 15 <=34 U/L Final   12/03/2021 18 <=34 U/L Final     Creatinine   Date Value Ref Range Status   08/23/2022 0.80 0.55 - 1.02 mg/dL Final   45/40/9811 9.14 0.55 - 1.02 mg/dL Final   78/29/5621 3.08 0.60 - 0.80 mg/dL Final   65/78/4696 2.95 0.60 - 1.00  mg/dL Final   .            Visit duration:  I personally spent 25 minutes face-to-face and non-face-to-face in the care of this patient, which includes all pre, intra, and post visit time on the date of service.

## 2022-10-22 NOTE — Unmapped (Signed)
[facility] Refill Coordination Note    Specialty Medication(s) to be Shipped:   Specialty Lite: Greggory Keen    Other medication(s) to be shipped:  Ventolin,retin a     [name], DOB: Feb 14, 1971  Phone: [phone] (home)       All above HIPAA information was verified with patient.     Was a Nurse, learning disability used for this call? No    Completed refill call assessment today to schedule patient's medication shipment from the [facility] [phone]).  All relevant notes have been reviewed.     Specialty medication(s) and dose(s) confirmed: Regimen is correct and unchanged.   Changes to medications: Barbarann reports no changes at this time.  Changes to insurance: No  New side effects reported not previously addressed with a pharmacist or physician: None reported  Questions for the pharmacist: No    Confirmed patient received a Conservation officer, historic buildings and a Surveyor, mining with first shipment. The patient will receive a drug information handout for each medication shipped and additional FDA Medication Guides as required.       DISEASE/MEDICATION-SPECIFIC INFORMATION        For patients on injectable medications: Patient currently has 1 doses left.  Next injection is scheduled for 10/28/2022.    SPECIALTY MEDICATION ADHERENCE     Medication Adherence    Patient reported X missed doses in the last month: 0  Specialty Medication: MOUNJARO 10 mg/0.5 mL Pnij (tirzepatide)  Patient is on additional specialty medications: No              Were doses missed due to medication being on hold? No      MOUNJARO 10 mg/0.5 mL Pnij (tirzepatide): 7 days of medicine on hand       REFERRAL TO PHARMACIST     Referral to the pharmacist: Not needed      [facility]     Shipping address confirmed in Epic.       Delivery Scheduled: Yes, Expected medication delivery date: 10/26/2022.     Medication will be delivered via Same Day Courier to the prescription address in Epic [place].    Joanie Duprey J Helane Gunther   [facility] Specialty Technician

## 2022-10-26 MED FILL — RETIN-A 0.05 % TOPICAL CREAM: 22 days supply | Qty: 45 | Fill #4

## 2022-10-26 MED FILL — MOUNJARO 10 MG/0.5 ML SUBCUTANEOUS PEN INJECTOR: SUBCUTANEOUS | 28 days supply | Qty: 2 | Fill #1

## 2022-10-26 MED FILL — VENTOLIN HFA 90 MCG/ACTUATION AEROSOL INHALER: RESPIRATORY_TRACT | 75 days supply | Qty: 54 | Fill #1

## 2022-11-05 ENCOUNTER — Ambulatory Visit
Admit: 2022-11-05 | Discharge: 2022-11-06 | Payer: BLUE CROSS/BLUE SHIELD | Attending: Physical Medicine & Rehabilitation | Primary: Physical Medicine & Rehabilitation

## 2022-11-05 DIAGNOSIS — M533 Sacrococcygeal disorders, not elsewhere classified: Principal | ICD-10-CM

## 2022-11-05 DIAGNOSIS — G8929 Other chronic pain: Principal | ICD-10-CM

## 2022-11-05 DIAGNOSIS — M47816 Spondylosis without myelopathy or radiculopathy, lumbar region: Principal | ICD-10-CM

## 2022-11-05 MED ADMIN — lidocaine (PF) (XYLOCAINE-MPF) 10 mg/mL (1 %) injection 5 mL: 5 mL | @ 14:00:00 | Stop: 2022-11-05

## 2022-11-05 MED ADMIN — dexAMETHasone sodium phos (PF) injection 10 mg: 10 mg | INTRA_ARTICULAR | @ 14:00:00 | Stop: 2022-11-05

## 2022-11-05 MED FILL — ACCU-CHEK GUIDE TEST STRIPS: 34 days supply | Qty: 50 | Fill #3

## 2022-11-05 MED FILL — FLUTICASONE PROPIONATE 50 MCG/ACTUATION NASAL SPRAY,SUSPENSION: NASAL | 60 days supply | Qty: 16 | Fill #1

## 2022-11-05 MED FILL — ACCU-CHEK SOFTCLIX LANCETS: 34 days supply | Qty: 100 | Fill #2

## 2022-11-05 NOTE — Unmapped (Signed)
[facility]   Physical Medicine and Rehabilitation     Patient Name:Darlene Lucas  MRN: [id number]  DOB: 05-04-1970  Age: 52 y.o.     ----------------------------------------------------------------------------------------------------------------------  November 05, 2022 8:36 AM. Documentation assistance provided by Simonne Maffucci, medical scribe, at the direction of Inocencio Homes, MD.  ----------------------------------------------------------------------------------------------------------------------    ASSESSMENT & PLAN:     11/05/22     DIAGNOSIS:   --SIJ dysfunction bilateral  --Chronic bilateral low back pain   --History of L2-S1 spinal fusion and SIJ fusion, now s/p surgical revision and hardware only at L2 to L5, as the L5-S1 and SIJ fusion hardware has been removed.    TREATMENT PLAN:   - Repeat Bilateral SI joint injections performed today. See full procedure note below.     - Lumbar Xrays have been ordered for you. You can go to the address listed below to complete your test(s). You do not need an appointment for this since walk-ins are accepted.  [facility] - [facility] at [facility]   Address: [address] [place], [place] [address]  Phone: [phone]    - Referred to PT to address lumbar back pain, and worsening posture. If you haven't been contacted to schedule this in 1-2 weeks, please call them at [phone].    NEXT STEPS/FOLLOW UP: 16 weeks     PROCEDURE: Bilateral sacroiliac (SI) joint and ligament injection with ultrasound guidance  INDICATION: sacroiliac joint dysfunction and buttock pain  TECHNIQUE: After confirming written and informed consent discussing risks of bleeding, infection, nerve injury, medication reaction, and alternative of not doing the procedure, the patient was placed in the prone position. Pre-procedure vitals were reviewed. A TIME OUT was performed. The patient did not require sedation for the procedure. Using universal sterile precautions, procedure equipment and medications were prepared in sterile fashion and skin was prepped with chlorhexidine. In addition a sterile ultrasound probe cover was utilized as well as sterile ultrasound gel. Ultrasound visualization was utilized to delineate the anatomy of the sacrum and thereafter used to guide needle placement. The Bilateral SI joint was identified.  Skin was anesthetized using 2ml of 1% lidocaine without epinephrine. A 20 gauge 3.5 inch needle was directed toward the target using sonographic guidance. Under Ultrasound guidance, the R SI joint was then injected with 5mg  dexamethasone and 1.5 cc of 1% lidocaine and the L SI joint was injected with 5mg  dexamethasone and 1.5 cc of 1% lidocaine for a total injectate volume of 2 cc each side. Negative aspiration was noted prior to injection. The needle tip was visualized at a depth of 3.  No paresthesias were reported with needle placement.  The needle(s) were removed intact and a bandage was applied. The patient was monitored, reassessed and discharged after an appropriate observatory period.  COMPLICATIONS: The patient tolerated the procedure well and there were no complications.  PRE-PROCEDURE PAIN SCORE: 7 bilaterally  POST-PROCEDURE PAIN SCORE: 3 bilaterally  POST-PROCEDURE EXAM: Marked improvement in pain.  No new neurologic compromise; patient discharged with normal baseline neurologic function.    Assistant: CMA Eugenie Filler    The imaging is on file and stored in a permanent location.     SUBJECTIVE:     Chief Complaint:   Repeat Injections    11/05/2022  Pt presents in f/u after receiving repeat bilateral SI joint injections at last visit. Today pt reports an itching sensation in the midback above her fusion, which she has associated  with the onset of disc pain in the past. Also endorses clicking and popping in spine. She reports some pain shooting down arm but it is not specific. She also has noticed worsened posture with her gait recently. She is concerned this is due to worsening back pain.     07/16/22:  Pt presents in follow up after receiving repeat bilateral SI joint injections at last visit. Today, reports that the prior injection provided 70% relief. Pain has returned over the past week.    02/17/22:  Pt presents in f/u for repeat SIJ injections after receiving bilateral SIJ injections at last visit and ordering L5-S1 facet injections. Today pt reports previous bilateral SIJ injections provided 100% relief for 3-4 months. They help her to walk which in turn helps her to lose weight. She is starting PT today. Pt has lost 127 pounds since the first time she has started seeking help for her back pain.    10/16/21:  Pt presents in f/u for bilateral SI joint injections after ordering L5-S1 facet injections . Today pt reports recent knee surgery about a month ago. No current ABX.   Wants bilateral SI inj.    07/08/21:   Pt presents in f/u after receiving bilateral facet injections at L5/S1 on 01/26/21. Today pt reports facet and SI injections were both helpful. Pt reports that the SI joint injections were more beneficial for pain than the facet injections. She finds injections are more effective when done around the same time.     01/21/21:  Pt presents in f/u for bilateral SI joint injections. Bilateral L5/S1 facet injections scheduled for 01/26/21. Today pt reports she'd like bilateral SI joint injections. She hasn't had SI injections in a year. She usually gets good relief from a schedule of SI joint injections and facet injections. States right side pain is radiating around the right side. She feels she needs injections every 6-8 mos to stay ahead of the pain.     12/18/20  Pt presents in f/u (previous Dr. Oneida Arenas patient) after receiving bilateral L5/S1 facet injections and bilateral SI joint injection at last visit (01/28/20).     Chronic midline low back pain currently controlled though currently seeking repeat bilateral SI joint and facet injections.  Patient endorses not being interested in SI joint radiofrequency ablation if injections become sub  Therapeutic. Patient notes losing 50 lbs intentionally.     Symptom Location: Bilateral SI joint pain with radicular component halfway down posterior bilateral thights.   Symptom Character: burning and sharp  Symptom Onset/Mechanism: chronic (>/= 3 months), symptoms flared over last 4 months.   Temporal Pattern: constant  Night Pain: no  Unintended Weight Loss: no  Fever/Infection:no  Neuromotor Function: motor weakness - (yes) endorses chronic, permanant nerve weakness in R>L legs,  gait/coordination disturbance - (no), loss of bowel or bladder control - (no), saddle anesthesia - (no)     History of Present Illness:   Darlene Lucas is a 52 y.o. year old female being evaluated in follow up.   01/15/2020: 90% relief from bilateral L5-S1 facet joint injections and bilateral SI joint injections at last visit. Has been dealing with chronic right ankle pain from previous surgeries with Dr. Deborah Chalk and working on weight [facility] with [facility]. Would like to repeat bilateral facet and SI joint injections given significant improvement in symptoms for >6 months with 80% relief.    Prior Interventions/Modalities:  - oxycodone, fentanyl    Meds:  - celebrex 200 mg BID  Prior Diagnostics:  XR Lumbar Spine 02/27/19  IMPRESSION:  -- Unchanged sequelae of posterior decompression and posterior spinal fixation extending from L2 to L5. No adverse radiographic hardware features.  -- Unchanged mild degenerative changes of the lumbar spine.    XR Pelvis 02/22/19  IMPRESSION:  1. Posterior decompression and posterior fusion L2-L5 with interbody spacer at L4-L5. No evidence of hardware complication.  2. Unchanged 2 mm retrolisthesis of L1 on L2, likely degenerative.  3. Mild degenerative disc disease at T12-L1 and L1-L2 and moderate degenerative disc disease at L5-S1, similar to the prior 2016 CT.  4. Bilateral sacroiliac fixation with periarticular sclerosis, likely reactive/postoperative.    Current Outpatient Medications   Medication Sig Dispense Refill    albuterol HFA 90 mcg/actuation inhaler Inhale 2 puffs every six (6) hours as needed for wheezing. 54 g 1    aspirin 81 MG chewable tablet Chew 1 tablet (81 mg total) daily. 30 tablet 0    blood sugar diagnostic (ACCU-CHEK GUIDE TEST STRIPS) Strp Use to check blood sugar once daily as directed 100 strip 3    calcium citrate-vitamin D3 (CITRACAL + D MAXIMUM) 315 mg-6.25 mcg (250 unit) per tablet Take 1 tablet by mouth Two (2) times a day. 60 tablet 1    celecoxib (CELEBREX) 200 MG capsule Take 1 capsule (200 mg total) by mouth Two (2) times a day. May take 1 additional dose as needed for acute pain. 90 capsule 3    celecoxib (CELEBREX) 200 MG capsule Take 1 capsule (200 mg total) by mouth two (2) times a day. 180 capsule 3    conjugated estrogens (PREMARIN) 0.625 mg/gram vaginal cream Insert 0.5 g into the vagina daily. 30 g 11    diclofenac sodium 1.5 % Drop Apply 40 drops topically four (4) times a day as needed (pain). 150 mL 2    estrogens, conjugated, (PREMARIN) 0.3 MG tablet Take 1 tablet (0.3 mg total) by mouth daily. 90 tablet 1    fluticasone propionate (FLONASE) 50 mcg/actuation nasal spray Use 1 spray in each nostril once daily. 16 g 3    lancets (ACCU-CHEK SOFTCLIX LANCETS) Misc Use to check blood sugar once daily as directed 100 each 3    levothyroxine (SYNTHROID) 50 MCG tablet Take 1 tablet (50 mcg total) by mouth daily. 90 tablet 1    metFORMIN (GLUCOPHAGE-XR) 500 MG 24 hr tablet Take 2 tablets (1,000 mg total) by mouth daily with evening meal. 120 tablet 5    minoxidil (LONITEN) 2.5 MG tablet Take 1/2 tablet (1.25 mg total) by mouth daily. 45 tablet 3    montelukast (SINGULAIR) 10 mg tablet Take 1 tablet (10 mg total) by mouth daily. 90 tablet 3    pantoprazole (PROTONIX) 40 MG tablet Take 1 tablet (40 mg total) by mouth two (2) times a day. 180 tablet 1    pravastatin (PRAVACHOL) 40 MG tablet Take 1 tablet (40 mg total) by mouth nightly. 90 tablet 1    spironolactone (ALDACTONE) 100 MG tablet Take 1 tablet (100 mg total) by mouth two (2) times a day. 180 tablet 1    tirzepatide (MOUNJARO) 10 mg/0.5 mL PnIj Inject 10 mg under the skin every seven (7) days. 2 mL 3    tirzepatide (MOUNJARO) 12.5 mg/0.5 mL PnIj Inject the contents of 1 pen (12.5 mg) under the skin once a week. 2 mL 6    tretinoin (RETIN-A) 0.05 % cream Apply a pea sized amount at night 2-3 times a week x  2 weeks, then every other night for 2 weeks, then nightly. If getting dry, use a facial moisturizer in the morning such as Oil of Olay complete sensitive skin or CereVe AM 45 g 4    eflornithine (VANIQA) 13.9 % cream Apply at night as directed (Patient not taking: Reported on 11/05/2022) 45 g 0     Current Facility-Administered Medications   Medication Dose Route Frequency Provider Last Rate Last Admin    dexAMETHasone sodium phos (PF) injection 10 mg  10 mg Intra-articular Once         lidocaine (PF) (XYLOCAINE-MPF) 10 mg/mL (1 %) injection 5 mL  5 mL Other Once            Allergies:   Iohexol, Propoxyphene, Shellfish containing products, Sulfa (sulfonamide antibiotics), and Topiramate    Past Medical / Surgical History:     Past Medical History:   Diagnosis Date    Abnormal Pap smear of cervix     Abnormal uterine bleeding     Was told possible adenomyosis, possible need for hysterectomy by Dr. Alvino Chapel. Had [place], and trial of 90 days of provera. 11/29/13 TVUS 5x6x4cm RO 2x1.5x1.5 LO 2x2x 1.6, EMS 5mm, abundant flow near endometrium c/w possible adenomyosis     Allergic Age 24    Anemia     Arthritis     Asthma     BMI 50.0-59.9, adult (CMS-HCC)     Chronic back pain     Constipation     Difficult intravenous access     Dysplasia of cervix, high grade CIN 2     Edema     Elevated LFTs     Environmental allergies     Eye trauma 2020    fractured orbit. Unsure which eye.    GERD (gastroesophageal reflux disease)     Goiter 2015    monitoring levels    Hyperlipidemia     Hypokalemia     Prediabetes     Pulmonary embolism (CMS-HCC) 1989, 1992    following miscarriages     Recurrent UTI     Right leg pain 10/2017    right fibula fracture    Thyroid function study abnormality 2015    seeing endocrine (TSH 5.37 12/25/13, T4 1.02       Past Surgical History:   Procedure Laterality Date    ENDOMETRIAL BIOPSY      11/2013    HYSTERECTOMY      Knee arthroscopy Left 1987    LUMBAR FUSION      L5 - S1 - multiple - also had hardware removed    LUMBAR FUSION  2008    L1-S1    PR ANKLE SCOPE,EXTENS DEBRIDEMNT Right 08/30/2018    Procedure: ARTHROSCOPY ANK SURG; DEBRID EXTEN - modifier 22;  Surgeon: Valda Favia, MD;  [facility];  Service: Orthopedics    PR ARTHROCENTESIS ASPIR&/INJ MAJOR JT/BURSA W/O [place] Right 08/30/2018    Procedure: ARTHROCENTESIS, ASPIRATION AND/OR INJECTION; MAJOR JOINT OR BURSA (E.G.SHOULDER/HIP/KNEE);  Surgeon: Valda Favia, MD;  [facility]: [facility] OR [facility];  Service: Orthopedics    PR BIOPSY OF VAGINA,EXTENSIVE N/A 09/20/2014    Procedure: BIOPSY VAGINAL MUCOSA; EXTENSIVE, REQUIRING SUTURE (INCLUDING CYSTS);  Surgeon: Hollice Gong, MD;  [facility];  Service: Advanced Laparoscopy    PR BIOPSY/EXCISION, LYMPH NODE(S) N/A 09/20/2014    Procedure: BX/EXC LYMPH NODE; OPEN, SUPERF (SEPART PROC);  Surgeon: Hollice Gong, MD;  [facility];  Service: Advanced  Laparoscopy    PR CLOSED TREATMENT PST MALLEOLUS FRACTURE W/O MANIP Right 11/10/2017    Procedure: CLOSED TREATMENT OF POSTERIOR MALLEOLUS FRACTURE; WITHOUT MANIPULATION;  Surgeon: Valda Favia, MD;  Location: [facility] OR [facility];  Service: Ortho Foot & Ankle    PR COLPOSCOPY,ENTIRE VAGINA,W/BIOPSY(S) N/A 09/20/2014    Procedure: COLPOSCOPY OF THE ENTIRE VAGINA, WITH CERVIX IF PRESENT; WITH BIOPSY(S) OF VAGINA/CERVIX;  Surgeon: Hollice Gong, MD;  Location: [facility] OR [facility];  Service: Advanced Laparoscopy    PR COLSC FLX W/RMVL OF TUMOR POLYP LESION SNARE TQ N/A 12/20/2019    Procedure: COLONOSCOPY FLEX; W/REMOV TUMOR/LES BY SNARE;  Surgeon: Jules Husbands, MD;  Location: [facility];  Service: Gastroenterology    PR CYSTOURETHROSCOPY N/A 06/13/2014    Procedure: CYSTOURETHROSCOPY (SEPARATE PROCEDURE);  Surgeon: Hollice Gong, MD;  Location: [facility] OR [facility];  Service: Advanced Laparoscopy    PR KNEE SCOPE,MED OR LAT MENIS REPAIR Right 09/15/2021    Procedure: ARTHROSCOPY, KNEE, SURGICAL; WITH MENISCUS REPAIR (MEDIAL OR LATERAL);  Surgeon: Nickola Major, MD;  Location: OR [facility];  Service: Orthopedics    PR LAP,VAG HYST,UTERUS 250GMS/<,SALP-OOPH N/A 06/13/2014    Procedure: ROBOTIC LAPAROSCOPY, SURGICAL, W/VAGINAL HYSTERECTOMY, UTERUS 250 GRAMS OR LESS; W/REMOVAL OF TUBE(S) &/OR OVARY(S);  Surgeon: Hollice Gong, MD;  Location: [facility];  Service: Advanced Laparoscopy    PR OPEN TREATMENT PROXIMAL FIBULA/SHAFT FRACTURE Right 11/10/2017    Procedure: OPEN TREATMENT OF PROXIMAL FIBULA OR SHAFT FRACTURE, INCLUDES INTERNAL FIXATION, WHEN PERFORMED - Modifier 22;  Surgeon: Valda Favia, MD;  Location: [facility] OR [facility];  Service: Ortho Foot & Ankle    PR OPEN TX DISTAL TIBIOFIBULAR JOINT DISRUPTION Right 11/10/2017    Procedure: OPEN TREATMENT OF DISTAL TIBIOFIBULAR JOINT (SYNDESMOSIS) DISRUPTION, INCLUDES INTERNAL FIXATION, IF DONE;  Surgeon: Valda Favia, MD;  Location: [facility] OR [facility];  Service: Ortho Foot & Ankle    PR PELVIC EXAMINATION W ANESTH N/A 09/20/2014    Procedure: PELVIC EXAMINATION UNDER ANESTHESIA (OTHER THAN LOCAL);  Surgeon: Hollice Gong, MD;  Location[facility];  Service: Advanced Laparoscopy    PR REMOVAL IMPLANT DEEP Right 08/30/2018    Procedure: R20--REMOVE IMPLANT; DEEP--RIGHT ANKLE x 2 - modifier 22;  Surgeon: Valda Favia, MD;  Location: [facility] OR [facility];  Service: Orthopedics    PR REPAIR 1 COLLAT ANKLE LIGMNT,PRIMARY Right 11/10/2017    Procedure: REPAIR, PRIMARY, DISRUPTED LIGAMENT, ANKLE; COLLATERAL;  Surgeon: Valda Favia, MD;  [facility];  Service: Ortho Foot & Ankle    PR UPPER GI ENDOSCOPY,BIOPSY N/A 12/20/2019    Procedure: UGI ENDOSCOPY; WITH BIOPSY, SINGLE OR MULTIPLE;  Surgeon: Jules Husbands, MD;  [facility]: [facility];  Service: Gastroenterology    SI joint surgery  2004    Bilateral     TONSILLECTOMY      TUBAL LIGATION         Social History     Socioeconomic History    Marital status: Widowed     Spouse name: None    Number of children: None    Years of education: None    Highest education level: None   Tobacco Use    Smoking status: Never    Smokeless tobacco: Never   Vaping Use    Vaping status: Never Used   Substance and Sexual Activity    Alcohol use: Not Currently     Alcohol/week: 0.0 standard drinks of alcohol  Comment: rare    Drug use: Never    Sexual activity: Yes     Partners: Male     Birth control/protection: Bilateral Tubal Ligation   Other Topics Concern    Do you use sunscreen? No    Tanning bed use? Yes    Are you easily burned? No    Excessive sun exposure? No    Blistering sunburns? No   Social History Narrative    Widowed, 07/05/2014    Has 2 children- ages 41, 36.    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                             Social Determinants of Health     Financial Resource Strain: Low Risk  (04/22/2022)    Overall Financial Resource Strain (CARDIA)     Difficulty of Paying Living Expenses: Not very hard   Food Insecurity: No Food Insecurity (04/22/2022)    Hunger Vital Sign     Worried About Running Out of Food in the Last Year: Never true     Ran Out of Food in the Last Year: Never true   Transportation Needs: No Transportation Needs (04/22/2022)    PRAPARE - Transportation     Lack of Transportation (Medical): No     Lack of Transportation (Non-Medical): No   Stress: No Stress Concern Present (07/05/2019)    [employer] of Occupational Health - Occupational Stress Questionnaire     Feeling of Stress : Only a little       Family History   Problem Relation Age of Onset    Pulmonary embolism Mother     Heart attack Mother     Stroke Mother     Allergies Father     Cancer Sister         cervical cancer    No Known Problems Daughter     No Known Problems Daughter     Clotting disorder Neg Hx     Anesthesia problems Neg Hx     Bleeding Disorder Neg Hx     Melanoma Neg Hx     Basal cell carcinoma Neg Hx     Squamous cell carcinoma Neg Hx     Breast cancer Neg Hx     Glaucoma Neg Hx     Macular degeneration Neg Hx        For any of the above entries which indicate no records on file, the patient reports no relevant history.                 Review of Systems:   Review of Systems was completed through a 10 organ system review and is listed in the chart.  Pertinent positives are noted in HPI or flowsheet and otherwise negative.  Patient has been instructed to followup with PCP or appropriate specialist for symptoms outside the purview of this speciality.    Answers submitted by the patient for this visit:  Back Pain Questionnaire (Submitted on 02/16/2022)  Chief Complaint: Back pain  Chronicity: chronic  Onset: more than 1 year ago  Frequency: daily  Progression since onset: gradually worsening  Pain location: gluteal, lumbar spine, sacro-iliac  Pain quality: aching, burning, cramping, shooting, stabbing  Radiates to: left thigh, right thigh  Pain - numeric: 6/10  Pain is: the same  all the time  Aggravated by: bending, coughing, position, sitting, standing, stress, twisting  Stiffness is present: all day  abdominal pain: No  bladder incontinence: No  bowel incontinence: No  chest pain: No  dysuria: No  fever: No  headaches: No  leg pain: Yes  numbness: No  paresis: No  paresthesias: Yes  perianal numbness: No  tingling: Yes  weakness: Yes  weight loss: No  OBJECTIVE:     Vitals:     Ht 154.9 cm (5' 1)  - Wt 67.1 kg (147 lb 14.9 oz)  - LMP 05/03/2017  - BMI 27.95 kg/m??     The above medications, allergies, history, and ROS, and vitals have been reviewed.    Physical Exam:   GEN: alert and oriented, no apparent distress  HEENT: normocephalic, atraumatic, anicteric, moist mucous membranes  CV: normal heart rate  PULM: normal work of breathing  GI: nondistended  EXT: no swelling, edema in b/l UE and LE  SKIN: no visible ecchymosis or breakdown  PSYCH: normal mood and affect    MSK:    NEURO:   Manual Muscle Testing     Left Lower Extremity Right Lower Extremity   Hip Flexion  5/5 5/5   Knee Extension  5/5 5/5   Knee Flexion 5/5 5/5   Ankle Dorsiflexion  5/5 5/5   Ankle Plantarflexion  5/5 5/5   EHL Extension  5/5 5/5     Sensory  Sensation to light touch WNL throughout b/l UE and LE    MSK:  Gait and Station: normal nonantalgic gait with symmetric body posture    Lumbar Spine:  Inspection: Normal alignment. No erythema, discoloration, or asymmetry.  Palpation: No paraspinal muscle tenderness.  No vertebral body point tenderness.  ROM: normal flexion, extension, rotation, lateral bend   Facet Loading Pain: - (positive) left, - (positive) right  Straight Leg Raise: - (negative) LLE, - (negative) RLE    Sacroiliac Joint:   Inspection: Normal alignment. No erythema, discoloration, or asymmetry.  Palpation: No tenderness at PSIS  Patrick's: (positive 7/10) LLE; (positive 7/10) RLE  Active assisted straight leg raise: - (negative) LLE, - (negative) RLE    ----------------------------------------------------------------------------------------------------------------------  November 05, 2022 8:36 AM. Documentation assistance provided by Simonne Maffucci, medical scribe, at the direction of Riccardo Dubin, Judi Saa, MD.  ----------------------------------------------------------------------------------------------------------------------    Tia Masker, MD  Assistant Professor - PM&R  Musculoskeletal and Spine Specialist - [facility] [employer]

## 2022-11-05 NOTE — Unmapped (Addendum)
Thanks so much for coming to see Dr. Riccardo Dubin today. It was a pleasure to meet you. This summary reviews the goals and plans we discussed at your visit today.     Below, you will see: a) your working diagnosis b) your treatment plan and c) your next steps and followup plan.    A.) Chronic SI joint dysfunction   Chronic bilateral low back pain     B.) We performed bilateral SI joint steroid injections  We ordered physical therapy for lumbar back pain and postural training   We ordered Lumbar XRays has been ordered for you. You can go to the address listed below to complete your test(s). You do not need an appointment for this since walk-ins are accepted.     [facility] - [facility] at [facility]   Address: [address], [place], [place] [address]  Phone: [phone]    C.) Follow-up in 3-4 months    We care about your quality of life and are committed to helping optimize your functionality.

## 2022-11-05 NOTE — Unmapped (Deleted)
[facility]   Physical Medicine and Rehabilitation     Patient Name:Darlene Lucas  MRN: [id number]  DOB: Jul 03, 1970  Age: 52 y.o.     ----------------------------------------------------------------------------------------------------------------------  November 05, 2022 7:51 AM. Documentation assistance provided by Hetty Blend, medical scribe, at the direction of Inocencio Homes, MD.  ----------------------------------------------------------------------------------------------------------------------     ASSESSMENT & PLAN:     11/05/22     DIAGNOSIS:   --SIJ dysfunction bilateral  --Chronic bilateral low back pain   --History of L2-S1 spinal fusion and SIJ fusion, now s/p surgical revision and hardware only at L2 to L5, as the L5-S1 and SIJ fusion hardware has been removed.    TREATMENT PLAN:   Repeat Bilateral SI joint injections performed today. See full procedure note below. 5 to 0 pain.    NEXT STEPS/FOLLOW UP: 16 weeks     SUBJECTIVE:     Chief Complaint:    No chief complaint on file.    11/05/2022  Pt presents in f/u after receiving bilateral SI joint injection at last visit. Today pt reports ***    07/16/22:  Pt presents in follow up after receiving repeat bilateral SI joint injections at last visit. Today, reports that the prior injection provided 70% relief. Pain has returned over the past week.    02/17/22:  Pt presents in f/u for repeat SIJ injections after receiving bilateral SIJ injections at last visit and ordering L5-S1 facet injections. Today pt reports previous bilateral SIJ injections provided 100% relief for 3-4 months. They help her to walk which in turn helps her to lose weight. She is starting PT today. Pt has lost 127 pounds since the first time she has started seeking help for her back pain.    10/16/21:  Pt presents in f/u for bilateral SI joint injections after ordering L5-S1 facet injections . Today pt reports recent knee surgery about a month ago. No current ABX.   Wants bilateral SI inj.    07/08/21:   Pt presents in f/u after receiving bilateral facet injections at L5/S1 on 01/26/21. Today pt reports facet and SI injections were both helpful. Pt reports that the SI joint injections were more beneficial for pain than the facet injections. She finds injections are more effective when done around the same time.     01/21/21:  Pt presents in f/u for bilateral SI joint injections. Bilateral L5/S1 facet injections scheduled for 01/26/21. Today pt reports she'd like bilateral SI joint injections. She hasn't had SI injections in a year. She usually gets good relief from a schedule of SI joint injections and facet injections. States right side pain is radiating around the right side. She feels she needs injections every 6-8 mos to stay ahead of the pain.     12/18/20  Pt presents in f/u (previous Dr. Oneida Arenas patient) after receiving bilateral L5/S1 facet injections and bilateral SI joint injection at last visit (01/28/20).     Chronic midline low back pain currently controlled though currently seeking repeat bilateral SI joint and facet injections.  Patient endorses not being interested in SI joint radiofrequency ablation if injections become sub  Therapeutic. Patient notes losing 50 lbs intentionally.     Symptom Location: Bilateral SI joint pain with radicular component halfway down posterior bilateral thights.   Symptom Character: burning and sharp  Symptom Onset/Mechanism: chronic (>/= 3 months), symptoms flared over last 4 months.   Temporal Pattern: constant  Night Pain: no  Unintended Weight Loss: no  Fever/Infection:no  Neuromotor Function: motor weakness - (yes) endorses chronic, permanant nerve weakness in R>L legs,  gait/coordination disturbance - (no), loss of bowel or bladder control - (no), saddle anesthesia - (no)     History of Present Illness:   Darlene Lucas is a 52 y.o. year old female being evaluated in follow up.   01/15/2020: 90% relief from bilateral L5-S1 facet joint injections and bilateral SI joint injections at last visit. Has been dealing with chronic right ankle pain from previous surgeries with Dr. Deborah Chalk and working on [facility] with [facility]. Would like to repeat bilateral facet and SI joint injections given significant improvement in symptoms for >6 months with 80% relief.    Prior Interventions/Modalities:  - oxycodone, fentanyl    Meds:  - celebrex 200 mg BID      Prior Diagnostics:  XR Lumbar Spine 02/27/19  IMPRESSION:  -- Unchanged sequelae of posterior decompression and posterior spinal fixation extending from L2 to L5. No adverse radiographic hardware features.  -- Unchanged mild degenerative changes of the lumbar spine.    XR Pelvis 02/22/19  IMPRESSION:  1. Posterior decompression and posterior fusion L2-L5 with interbody spacer at L4-L5. No evidence of hardware complication.  2. Unchanged 2 mm retrolisthesis of L1 on L2, likely degenerative.  3. Mild degenerative disc disease at T12-L1 and L1-L2 and moderate degenerative disc disease at L5-S1, similar to the prior 2016 CT.  4. Bilateral sacroiliac fixation with periarticular sclerosis, likely reactive/postoperative.    Current Outpatient Medications   Medication Sig Dispense Refill    albuterol HFA 90 mcg/actuation inhaler Inhale 2 puffs every six (6) hours as needed for wheezing. 54 g 1    aspirin 81 MG chewable tablet Chew 1 tablet (81 mg total) daily. 30 tablet 0    blood sugar diagnostic (ACCU-CHEK GUIDE TEST STRIPS) Strp Use to check blood sugar once daily as directed 100 strip 3    calcium citrate-vitamin D3 (CITRACAL + D MAXIMUM) 315 mg-6.25 mcg (250 unit) per tablet Take 1 tablet by mouth Two (2) times a day. 60 tablet 1    celecoxib (CELEBREX) 200 MG capsule Take 1 capsule (200 mg total) by mouth Two (2) times a day. May take 1 additional dose as needed for acute pain. 90 capsule 3    celecoxib (CELEBREX) 200 MG capsule Take 1 capsule (200 mg total) by mouth two (2) times a day. 180 capsule 3    diclofenac sodium 1.5 % Drop Apply 40 drops topically four (4) times a day as needed (pain). 150 mL 2    eflornithine (VANIQA) 13.9 % cream Apply at night as directed 45 g 0    estrogens, conjugated, (PREMARIN) 0.3 MG tablet Take 1 tablet (0.3 mg total) by mouth daily. 90 tablet 1    fluticasone propionate (FLONASE) 50 mcg/actuation nasal spray Use 1 spray in each nostril once daily. 16 g 3    lancets (ACCU-CHEK SOFTCLIX LANCETS) Misc Use to check blood sugar once daily as directed 100 each 3    levothyroxine (SYNTHROID) 50 MCG tablet Take 1 tablet (50 mcg total) by mouth daily. 90 tablet 1    metFORMIN (GLUCOPHAGE-XR) 500 MG 24 hr tablet Take 2 tablets (1,000 mg total) by mouth daily with evening meal. 120 tablet 5    minoxidil (LONITEN) 2.5 MG tablet Take 1/2 tablet (1.25 mg total) by mouth daily. 45 tablet 3    montelukast (SINGULAIR) 10 mg tablet Take 1 tablet (10 mg total) by mouth  daily. 90 tablet 3    pantoprazole (PROTONIX) 40 MG tablet Take 1 tablet (40 mg total) by mouth two (2) times a day. 180 tablet 1    pravastatin (PRAVACHOL) 40 MG tablet Take 1 tablet (40 mg total) by mouth nightly. 90 tablet 1    spironolactone (ALDACTONE) 100 MG tablet Take 1 tablet (100 mg total) by mouth two (2) times a day. 180 tablet 1    tirzepatide (MOUNJARO) 10 mg/0.5 mL PnIj Inject 10 mg under the skin every seven (7) days. 2 mL 3    tirzepatide (MOUNJARO) 12.5 mg/0.5 mL PnIj Inject the contents of 1 pen (12.5 mg) under the skin once a week. 2 mL 6    tretinoin (RETIN-A) 0.05 % cream Apply a pea sized amount at night 2-3 times a week x 2 weeks, then every other night for 2 weeks, then nightly. If getting dry, use a facial moisturizer in the morning such as Oil of Olay complete sensitive skin or CereVe AM 45 g 4     No current facility-administered medications for this visit.       Allergies:   Iohexol, Propoxyphene, Shellfish containing products, Sulfa (sulfonamide antibiotics), and Topiramate    Past Medical / Surgical History:     Past Medical History:   Diagnosis Date    Abnormal Pap smear of cervix     Abnormal uterine bleeding     Was told possible adenomyosis, possible need for hysterectomy by Dr. Alvino Chapel. Had [place], and trial of 90 days of provera. 11/29/13 TVUS 5x6x4cm RO 2x1.5x1.5 LO 2x2x 1.6, EMS 5mm, abundant flow near endometrium c/w possible adenomyosis     Allergic Age 17    Anemia     Arthritis     Asthma     BMI 50.0-59.9, adult (CMS-HCC)     Chronic back pain     Constipation     Difficult intravenous access     Dysplasia of cervix, high grade CIN 2     Edema     Elevated LFTs     Environmental allergies     Eye trauma 2020    fractured orbit. Unsure which eye.    GERD (gastroesophageal reflux disease)     Goiter 2015    monitoring levels    Hyperlipidemia     Hypokalemia     Prediabetes     Pulmonary embolism (CMS-HCC) 1989, 1992    following miscarriages     Recurrent UTI     Right leg pain 10/2017    right fibula fracture    Thyroid function study abnormality 2015    seeing endocrine (TSH 5.37 12/25/13, T4 1.02       Past Surgical History:   Procedure Laterality Date    ENDOMETRIAL BIOPSY      11/2013    HYSTERECTOMY      Knee arthroscopy Left 1987    LUMBAR FUSION      L5 - S1 - multiple - also had hardware removed    LUMBAR FUSION  2008    L1-S1    PR ANKLE SCOPE,EXTENS DEBRIDEMNT Right 08/30/2018    Procedure: ARTHROSCOPY ANK SURG; DEBRID EXTEN - modifier 22;  Surgeon: Valda Favia, MD;  Location: [facility] OR [facility];  Service: Orthopedics    PR ARTHROCENTESIS ASPIR&/INJ MAJOR JT/BURSA W/O [place] Right 08/30/2018    Procedure: ARTHROCENTESIS, ASPIRATION AND/OR INJECTION; MAJOR JOINT OR BURSA (E.G.SHOULDER/HIP/KNEE);  Surgeon: Valda Favia, MD;  [facility]: [facility] OR [facility];  Service: Orthopedics  PR BIOPSY OF VAGINA,EXTENSIVE N/A 09/20/2014    Procedure: BIOPSY VAGINAL MUCOSA; EXTENSIVE, REQUIRING SUTURE (INCLUDING CYSTS);  Surgeon: Hollice Gong, MD;  [facility];  Service: Advanced Laparoscopy    PR BIOPSY/EXCISION, LYMPH NODE(S) N/A 09/20/2014    Procedure: BX/EXC LYMPH NODE; OPEN, SUPERF (SEPART PROC);  Surgeon: Hollice Gong, MD;  [facility];  Service: Advanced Laparoscopy    PR CLOSED TREATMENT PST MALLEOLUS FRACTURE W/O MANIP Right 11/10/2017    Procedure: CLOSED TREATMENT OF POSTERIOR MALLEOLUS FRACTURE; WITHOUT MANIPULATION;  Surgeon: Valda Favia, MD;  Location: [facility] OR [facility];  Service: Ortho Foot & Ankle    PR COLPOSCOPY,ENTIRE VAGINA,W/BIOPSY(S) N/A 09/20/2014    Procedure: COLPOSCOPY OF THE ENTIRE VAGINA, WITH CERVIX IF PRESENT; WITH BIOPSY(S) OF VAGINA/CERVIX;  Surgeon: Hollice Gong, MD;  Location: [facility] OR [facility];  Service: Advanced Laparoscopy    PR COLSC FLX W/RMVL OF TUMOR POLYP LESION SNARE TQ N/A 12/20/2019    Procedure: COLONOSCOPY FLEX; W/REMOV TUMOR/LES BY SNARE;  Surgeon: Jules Husbands, MD;  [facility];  Service: Gastroenterology    PR CYSTOURETHROSCOPY N/A 06/13/2014    Procedure: CYSTOURETHROSCOPY (SEPARATE PROCEDURE);  Surgeon: Hollice Gong, MD;  Location: [facility] OR [facility];  Service: Advanced Laparoscopy    PR KNEE SCOPE,MED OR LAT MENIS REPAIR Right 09/15/2021    Procedure: ARTHROSCOPY, KNEE, SURGICAL; WITH MENISCUS REPAIR (MEDIAL OR LATERAL);  Surgeon: Nickola Major, MD;  Location: [facility];  Service: Orthopedics    PR LAP,VAG HYST,UTERUS 250GMS/<,SALP-OOPH N/A 06/13/2014    Procedure: ROBOTIC LAPAROSCOPY, SURGICAL, W/VAGINAL HYSTERECTOMY, UTERUS 250 GRAMS OR LESS; W/REMOVAL OF TUBE(S) &/OR OVARY(S);  Surgeon: Hollice Gong, MD;  [facility] OR [facility];  Service: Advanced Laparoscopy    PR OPEN TREATMENT PROXIMAL FIBULA/SHAFT FRACTURE Right 11/10/2017    Procedure: OPEN TREATMENT OF PROXIMAL FIBULA OR SHAFT FRACTURE, INCLUDES INTERNAL FIXATION, WHEN PERFORMED - Modifier 22;  Surgeon: Valda Favia, MD;  Location: [facility] OR [facility];  Service: Ortho Foot & Ankle    PR OPEN TX DISTAL TIBIOFIBULAR JOINT DISRUPTION Right 11/10/2017    Procedure: OPEN TREATMENT OF DISTAL TIBIOFIBULAR JOINT (SYNDESMOSIS) DISRUPTION, INCLUDES INTERNAL FIXATION, IF DONE;  Surgeon: Valda Favia, MD;  Location: [facility] OR [facility];  Service: Ortho Foot & Ankle    PR PELVIC EXAMINATION W ANESTH N/A 09/20/2014    Procedure: PELVIC EXAMINATION UNDER ANESTHESIA (OTHER THAN LOCAL);  Surgeon: Hollice Gong, MD;  Location: [facility] OR [facility];  Service: Advanced Laparoscopy    PR REMOVAL IMPLANT DEEP Right 08/30/2018    Procedure: R20--REMOVE IMPLANT; DEEP--RIGHT ANKLE x 2 - modifier 22;  Surgeon: Valda Favia, MD;  [facility]: [facility];  Service: Orthopedics    PR REPAIR 1 COLLAT ANKLE LIGMNT,PRIMARY Right 11/10/2017    Procedure: REPAIR, PRIMARY, DISRUPTED LIGAMENT, ANKLE; COLLATERAL;  Surgeon: Valda Favia, MD;  Location: [facility] OR [facility];  Service: Ortho Foot & Ankle    PR UPPER GI ENDOSCOPY,BIOPSY N/A 12/20/2019    Procedure: UGI ENDOSCOPY; WITH BIOPSY, SINGLE OR MULTIPLE;  Surgeon: Jules Husbands, MD;  [facility];  Service: Gastroenterology    SI joint surgery  2004    Bilateral     TONSILLECTOMY      TUBAL LIGATION         Social History     Socioeconomic History    Marital status: Widowed   Tobacco Use    Smoking status: Never    Smokeless  tobacco: Never   Vaping Use    Vaping status: Never Used   Substance and Sexual Activity    Alcohol use: Not Currently     Alcohol/week: 0.0 standard drinks of alcohol     Comment: rare    Drug use: Never    Sexual activity: Yes     Partners: Male     Birth control/protection: Bilateral Tubal Ligation   Other Topics Concern    Do you use sunscreen? No    Tanning bed use? Yes    Are you easily burned? No    Excessive sun exposure? No    Blistering sunburns? No   Social History Narrative    Widowed, 07/05/2014    Has 2 children- ages 3, 42.    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                             Social Determinants of Health     Financial Resource Strain: Low Risk  (04/22/2022)    Overall Financial Resource Strain (CARDIA)     Difficulty of Paying Living Expenses: Not very hard   Food Insecurity: No Food Insecurity (04/22/2022)    Hunger Vital Sign     Worried About Running Out of Food in the Last Year: Never true     Ran Out of Food in the Last Year: Never true   Transportation Needs: No Transportation Needs (04/22/2022)    PRAPARE - Transportation     Lack of Transportation (Medical): No     Lack of Transportation (Non-Medical): No   Stress: No Stress Concern Present (07/05/2019)    [employer] of Occupational Health - Occupational Stress Questionnaire     Feeling of Stress : Only a little       Family History   Problem Relation Age of Onset    Pulmonary embolism Mother     Heart attack Mother     Stroke Mother     Allergies Father     Cancer Sister         cervical cancer    No Known Problems Daughter     No Known Problems Daughter     Clotting disorder Neg Hx     Anesthesia problems Neg Hx     Bleeding Disorder Neg Hx     Melanoma Neg Hx     Basal cell carcinoma Neg Hx     Squamous cell carcinoma Neg Hx     Breast cancer Neg Hx     Glaucoma Neg Hx     Macular degeneration Neg Hx        For any of the above entries which indicate no records on file, the patient reports no relevant history.                 Review of Systems:   Review of Systems was completed through a 10 organ system review and is listed in the chart.  Pertinent positives are noted in HPI or flowsheet and otherwise negative.  Patient has been instructed to followup with PCP or appropriate specialist for symptoms outside the purview of this speciality.    Answers submitted by the patient for this visit:  Back Pain Questionnaire (Submitted on 02/16/2022)  Chief Complaint: Back pain  Chronicity: chronic  Onset: more than 1 year ago  Frequency: daily  Progression since onset: gradually worsening  Pain location: gluteal, lumbar spine, sacro-iliac  Pain quality: aching, burning, cramping, shooting, stabbing  Radiates to: left thigh, right thigh  Pain - numeric: 6/10  Pain is: the same all the time  Aggravated by: bending, coughing, position, sitting, standing, stress, twisting  Stiffness is present: all day  abdominal pain: No  bladder incontinence: No  bowel incontinence: No  chest pain: No  dysuria: No  fever: No  headaches: No  leg pain: Yes  numbness: No  paresis: No  paresthesias: Yes  perianal numbness: No  tingling: Yes  weakness: Yes  weight loss: No  OBJECTIVE:     Vitals:     LMP 05/03/2017     The above medications, allergies, history, and ROS, and vitals have been reviewed.    Physical Exam:   GEN: alert and oriented, no apparent distress  HEENT: normocephalic, atraumatic, anicteric, moist mucous membranes  CV: normal heart rate  PULM: normal work of breathing  GI: nondistended  EXT: no swelling, edema in b/l UE and LE  SKIN: no visible ecchymosis or breakdown  PSYCH: normal mood and affect    MSK:    NEURO:   Manual Muscle Testing    Left Upper Extremity Right Upper Extremity   Shoulder Abduction 5/5 5/5   Elbow Flexion 5/5 5/5   Elbow Extension  5/5 5/5   Wrist Extension  5/5 5/5   Finger Abduction  5/5 5/5   Grip Strength  5/5 5/5           Left Lower Extremity Right Lower Extremity   Hip Flexion  5/5 5/5   Knee Extension  5/5 5/5   Knee Flexion 5/5 5/5   Ankle Dorsiflexion  5/5 5/5   Ankle Plantarflexion  5/5 5/5   EHL Extension  5/5 5/5         Reflexes     Left Upper Extremity Right Upper Extremity    Triceps 2+ 2+   Biceps 2+ 2+   Brachioradialis 2+ 2+   Hoffman's  negative negative        Left Lower Extremity Right Lower Extremity    Patellar 2+ 2+   Achilles  2+ 2+   Ankle Clonus negative negative   Babinski negative negative     Sensory  Sensation to light touch WNL throughout b/l UE and LE    MSK:  Gait and Station: normal nonantalgic gait with symmetric body posture    Cervical Spine:   Inspection: Normal alignment. No erythema, discoloration, or asymmetry.  Palpation: No paraspinal muscle tendereness.  No vertebral body point tenderness.  ROM: normal flexion, extension, rotation, lateral bend  Spurling's: - (negative) LUE, - (negative) RUE    Lumbar Spine:  Inspection: Normal alignment. No erythema, discoloration, or asymmetry.  Palpation: No paraspinal muscle tenderness.  No vertebral body point tenderness.  ROM: normal flexion, extension, rotation, lateral bend   Facet Loading Pain: - (negative) left, - (negative) right  Straight Leg Raise: - (negative) LLE, - (negative) RLE    Sacroiliac Joint:   Inspection: Normal alignment. No erythema, discoloration, or asymmetry.  Palpation: No tenderness at PSIS  Patrick's: (positive 5/10) LLE; (positive 7/10) RLE  Active assisted straight leg raise: - (negative) LLE, - (negative) RLE    Hip:  Inspection: Normal alignment. No erythema, discoloration, or asymmetry.  Palpation: No tenderness to palpation.  ROM: normal flexion, extension, internal/external rotation  FABER: - (negative) LLE, - (negative) RLE  IR/Scour: - (negative) LLE, - (negative) RLE      ----------------------------------------------------------------------------------------------------------------------  November 05, 2022 7:50 AM. Documentation assistance provided by Hetty Blend, medical scribe, at the direction of Riccardo Dubin, Judi Saa, MD.  ----------------------------------------------------------------------------------------------------------------------     Tia Masker, MD  Assistant Professor - PM&R  Musculoskeletal and Spine Specialist - [facility] [employer]

## 2022-11-09 ENCOUNTER — Ambulatory Visit: Admit: 2022-11-09 | Payer: BLUE CROSS/BLUE SHIELD

## 2022-11-17 NOTE — Unmapped (Signed)
[facility] Refill Coordination Note    Specialty Lite Medication(s) to be Shipped:   Mounjaro    Other medication(s) to be shipped:  celecoxib, pravastatin     [name], DOB: 1971-02-24  Phone: [phone] (home)       All above HIPAA information was verified with patient.     Was a Nurse, learning disability used for this call? No    Changes to medications: Lutisha reports no changes at this time.  Changes to insurance: No      REFERRAL TO PHARMACIST     Referral to the pharmacist: Not needed      [facility]     Shipping address confirmed in Epic.     Delivery Scheduled: Yes, Expected medication delivery date: 11/19/22.     Medication will be delivered via Same Day Courier to the prescription address in Epic WAM.    Ainara Eldridge' W Danae Chen Shared [facility] Specialty Technician

## 2022-11-19 MED FILL — PRAVASTATIN 40 MG TABLET: ORAL | 90 days supply | Qty: 90 | Fill #1

## 2022-11-19 MED FILL — MOUNJARO 10 MG/0.5 ML SUBCUTANEOUS PEN INJECTOR: SUBCUTANEOUS | 28 days supply | Qty: 2 | Fill #2

## 2022-11-19 MED FILL — CELECOXIB 200 MG CAPSULE: ORAL | 90 days supply | Qty: 180 | Fill #2

## 2022-12-07 ENCOUNTER — Ambulatory Visit: Admit: 2022-12-07 | Discharge: 2022-12-08 | Payer: BLUE CROSS/BLUE SHIELD

## 2022-12-07 DIAGNOSIS — M23241 Derangement of anterior horn of lateral meniscus due to old tear or injury, right knee: Principal | ICD-10-CM

## 2022-12-07 DIAGNOSIS — M170 Bilateral primary osteoarthritis of knee: Principal | ICD-10-CM

## 2022-12-07 NOTE — Unmapped (Signed)
SPORTS MEDICINE RETURN VISIT    ASSESSMENT AND PLAN      Diagnosis ICD-10-CM Associated Orders   1. Primary osteoarthritis of both knees  M17.0 Lg Joint Inj: bilateral knee      2. 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  Her insurance does not cover HA and she is not interested in self-pay or PRP options due to cost.     No follow-ups on file.    Procedure(s):  Procedure Note:  Knee Steroid Injection      Clinical Indication: intraarticular knee pain     Informed Consent:   The risks and benefits of the procedure were discussed with the patient. Risks include pain (specifically discussing risk of steroid flare), bleeding, infection (1:10,000 minimized by sterile technique), liponecrosis (up to 0.6%, typically normalizes within 2-3 years), skin thinning, permanent skin hypopigmentation (up to 0.8% depending on location), and need for subsequent procedures. Rarely, damage to internal structures may occur requiring subsequent procedures or, exceedingly rare, loss of function of the joint; this risk is minimized through use of ultrasound guidance. Benefits may include symptom relief and potentially diagnosis.    The alternatives were explained to the patient including: not doing the procedure or postponing the procedure; dextrose-prolotherapy, and PRP injection. The disadvantages to not doing the procedure were discussed with the patent, including the persistence of symptoms.    Risks were discussed with patient and informed consent obtained with verbal assent.  Prior to procedure, timeout was completed verifying patient information x 2 and proper location and this was verified with patient.      Following this discussion, the patient voiced understanding and elected to proceed.     Procedure Description  Prior to procedure, timeout was completed verifying patient information x 2 and proper location and this was verified with patient.  Sterile prep of the lateral knee was completed with chloroprep x2 and sterile gel.  The probe was cavicided.  Clean technique with non-sterile gloves and sterile needle with single touch utilized as a 22 G 1.5in needle was directed to the knee joint under US guidance and 2cc Ropivacaine 0.5%  and 20mg  triamcinolone  was injected into the kneee joint with good flow of mixture into affected area.  Patient tolerated well and without complication.    Contralateral procedure  Attention was then turned to the contralateral side, and the procedure was repeated in its entirety on the contralateral side.    Need for Sonographic Guidance  Given the complexity of this problem and the anatomic location of this structure, sonographic guidance is recommended to prevent injury to neurovascular structures and confirm accuracy of injection. The accuracy of doing these injections blind is poor and the benefit to the patient by using ultrasound guidance is significant to avoid complications.     Reference:  Morey Hummingbird MM, Genene Churn, et al. [facility] Society for Sports Medicine (AMSSM) position statement: interventional musculoskeletal ultrasound in sports medicine. Br J Sports Med. 2015 Feb;49(3):145-50.         SUBJECTIVE     Chief Complaint: No chief complaint on file.      History of Present Illness: 52 y.o. female with a  history of who presents for bilateral knee pain.     Left knee: She has known osteoarthritis of her left knee and has undergone every 6 months corticosteroid injections since 2019.  Last completed in April of this year. Pain is not limiting her daily activities. She has previously tried to work with her insurance company to get HA injections approved, however it is not covered. She would like to proceed with CSI today and notes that they tend to last her 4-6 months.       Right knee: History of right knee lateral meniscus all inside repair on 09/15/2021.  Has been having some pain along the medial aspect of her joint near her port scar.  No effusions today.  Wanted to proceed today as well with CSI for this knee.     Past Medical History:   Past Medical History:   Diagnosis Date    Abnormal Pap smear of cervix     Abnormal uterine bleeding     Was told possible adenomyosis, possible need for hysterectomy by Dr. Alvino Chapel. Had [place], and trial of 90 days of provera. 11/29/13 TVUS 5x6x4cm RO 2x1.5x1.5 LO 2x2x 1.6, EMS 5mm, abundant flow near endometrium c/w possible adenomyosis     Allergic Age 83    Anemia     Arthritis     Asthma     BMI 50.0-59.9, adult (CMS-HCC)     Chronic back pain     Constipation     Difficult intravenous access     Dysplasia of cervix, high grade CIN 2     Edema     Elevated LFTs     Environmental allergies     Eye trauma 2020    fractured orbit. Unsure which eye.    GERD (gastroesophageal reflux disease)     Goiter 2015    monitoring levels    Hyperlipidemia     Hypokalemia     Prediabetes     Pulmonary embolism (CMS-HCC) 1989, 1992    following miscarriages     Recurrent UTI     Right leg pain 10/2017    right fibula fracture    Thyroid function study abnormality 2015    seeing endocrine (TSH 5.37 12/25/13, T4 1.02         OBJECTIVE     Physical Exam:  Vitals:   Wt Readings from Last 3 Encounters:   11/05/22 67.1 kg (147 lb 14.9 oz)   10/21/22 67.1 kg (148 lb)   09/24/22 67.9 kg (149 lb 9.6 oz)     Estimated body mass index is 27.95 kg/m?? as calculated from the following:    Height as of 11/05/22: 154.9 cm (5' 1).    Weight as of 11/05/22: 67.1 kg (147 lb 14.9 oz).  Gen: Well-appearing female in no acute distress  Knee Musculoskeletal Exam  Gait    Gait is normal.    Inspection    Leg length disparity: no discrepancy    Right      Erythema: none        Effusion: none        Edema: none        Ecchymosis: none      Left      Erythema: none        Effusion: none        Edema: none        Ecchymosis: none      Palpation    Right      Increased  warmth: none      Masses: none        Tenderness: present      Left      Increased warmth: none        Masses: none        Tenderness: present      Palpation additional comments: Anteromedial aspect of joint line, medial aspect of patella (bilaterally)    Strength    Right      Extension: 4+/5.       Flexion: 4+/5.     Left      Extension: 4+/5.       Flexion: 4+/5.     Neurovascular    Right      Right knee neurovascular exam is normal.      Left      Left knee neurovascular exam is normal.        Imaging/other tests:   No new imaging today    Orthopaedic PROMIS:  Failed to redirect to the Timeline version of the REVFS SmartLink.        03/08/2022 06/29/2022 12/07/2022   [facility]   Lower Extremity Physical Function CAT Score 31 24    Pain Interference CAT Score 25 31 29           PRO Status:  Patient completed and provider reviewed PRO.        ADMINISTRATIVE     I have personally reviewed and interpreted the images (as available).  Point-of-care ultrasound imaging is on file and stored in a permanent location (if performed).  I have personally reviewed prior records and incorporated relevant information above (as available).    MEDICAL DECISION MAKING (level of service defined by 2/3 elements)     Number/Complexity of Problems Addressed 1 or more chronic illnesses with exacerbation, progression, or side effects of treatment (99204/99214)   Amount/Complexity of Data to be Reviewed/Analyzed 3 points: Review prior notes (1 point per unique source); Review test results (1 point per unique test); Order tests (1 point per unique test); Assessment requiring an independent historian (99204/99214)   Risk of Complications/Morbidity/Mortality of Management Decision for MINOR Surgery (Including Injection) WITH Risk Factors (99204/99214)     TIME     Total Time for E/M Services on the Date of Encounter Time-based coding not utilized for this encounter     CONSULTATION     Consultation services provided No     MODIFIER 25 (Significant, Separately Billable Evaluation and Management)     Documentation to ensure appropriate insurance payment for medically necessary work    Per the [employer] (rev. 03/22/2021) Chapter 13, Section B. Evaluation & Management (E&M) Services, paragraph 5:   ???In general, E&M services on the same date of service as the minor surgical procedure are included in the payment for the procedure???However, a significant and separately identifiable E&M service unrelated to the decision to perform the minor surgical procedure is separately reportable with modifier 25. The E&M service and minor surgical procedure do not require different diagnoses.???    Per the American Medical Association in Reporting CPT Modifier 25 [CPT??Geophysicist/field seismologist (Online). 2023;33(11):1-12.] Page 1, Appropriate Use, paragraph 1:   ???Modifier 25 is used to indicate that a patient's condition required a significant, separately identifiable evaluation and management (E/M) service above and beyond that associated with another procedure or service being reported by the same physician or other qualified health care professional [facility]) on the same date. This service  must be above and beyond the other service provided or beyond the usual preoperative and postoperative care associated with the procedure or service that was performed on that same date, and it must be substantiated by documentation in the patient's record that satisfies the relevant criteria for the respective E/M service to be reported.???    Per the American Medical Association in Reporting CPT Modifier 25 [CPT??Geophysicist/field seismologist (Online). 2023;33(11):1-12.] Page 2, Considerations, bullet point 2 (Requires awareness of usual preoperative and postoperative services):   ???Pre- and post-operative services typically associated with a procedure include the following and cannot be reported with a separate E/M services code:   Review of patient's relevant past medical history,  Assessment of the problem area to be treated by surgical or other service,  Formulation and explanation of the clinical diagnosis,  Review and explanation of the procedure to the patient, family, or caregiver,  Discussion of alternative treatments or diagnostic options,  Obtaining informed consent,  Providing postoperative care instructions,  Discussion of any further treatment and follow up after the procedure???    As the service provider for this encounter, I attest that the patient's condition required a significant, separately identifiable, medical necessary evaluation and management service in addition to the procedure performed on the same date of service. The evaluation and management service was above and beyond the usual preoperative and postoperative care associated with the procedure. The specific elements of the encounter that represent evaluation and management service above and beyond the usual preoperative and postoperative care associated with the procedure include, but are not limited to:  Reviewed the patient's relevant past surgical history, social history and family history  Reviewed the patient's medications  Reviewed the patient's allergies  Independently obtained history of present illness and relevant review of systems  Independently reviewed laboratory, imaging and/or other data  Independently synthesized history, physical examination, laboratory, imaging and/or other data to formulate a management plan including elements separate from the procedure itself and usual postprocedural care         PROCEDURES     Lg Joint Inj: bilateral knee on 12/07/2022 12:50 PM  Indications: pain  Details: ultrasound-guided  Laterality: bilateral  Location: knee  Medications (Right): 20 mg triamcinolone acetonide 40 mg/mL  Medications (Left): 20 mg triamcinolone acetonide 40 mg/mL    Medical Care Team Attestation: All ProcDoc orders were read back and verbally confirmed with the procedure provider, including but not limited to patient name, medication name, dose, and route, before any actions were taken.  Provider Attestation: The information documented by members of my medical care team was reviewed and verified for accuracy by me.           DME     DME ORDER:  Dx:  ,

## 2022-12-08 NOTE — Unmapped (Signed)
[facility] Refill Coordination Note    Specialty Lite Medication(s) to be Shipped:   Mounjaro    Other medication(s) to be shipped: No additional medications requested for fill at this time     Darlene Lucas, DOB: 1971-01-31  Phone: [phone] (home)       All above HIPAA information was verified with patient.     Was a Nurse, learning disability used for this call? No    Changes to medications: Lasean reports no changes at this time.  Changes to insurance: No      REFERRAL TO PHARMACIST     Referral to the pharmacist: No - specialty medication dose was changed, however patient has been counseled and pharmacist has reviewed.      SHIPPING     Shipping address confirmed in Epic.     Delivery Scheduled: Yes, Expected medication delivery date: 12/17/22.     Medication will be delivered via Same Day Courier to the prescription address in Epic WAM.    [facility] Specialty Technician

## 2022-12-08 NOTE — Unmapped (Signed)
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   - Continue spironolactone to 200 mg daily (08/2022 K wnl, pt is menopausal).   - Continue oral minoxidil 1.25 mg daily. Risks: do not use in patients with CHF, valve disease, poorly controlled hypertension, pulmonary hypertension. Patient does not have a history of CHF or valve disease. She has well controlled essential hypertension (per documentation by PCP 08/2022)  - Start finasteride (PROPECIA) 1 mg tablet; Take 1 tablet (1 mg total) by mouth daily.  Patient currently abstinent and has had hysterectomy. If not approved by insurance, will plan to increase minoxidil to 2.5 mg      Milia   - Benign, reassurance provided  - Reassured that lesion appears benign, presenting with no worrisome features on exam today  - Continue tretinoin 0.05% cream. Apply a pea-sized amount for the entire face on dry skin at every other night, increase to nightly as tolerated. Mix with moisturizer if too drying  - Instructed patient to contact us if prescription is cost-prohibitive so we can send the tretinoin to a compounding pharmacy   - The importance of sun protection and using OTC  broad spectrum, SPF 30 or higher sunscreen was reviewed.      The patient was advised to call for an appointment should any new, changing, or symptomatic lesions develop.     RTC: Return in about 3 months (around 03/10/2023). or sooner as needed   _________________________________________________________________      Chief Complaint     Chief Complaint   Patient presents with    Skin Check     Pt coming in for FBSE.  Pt would like to discuss the hair loss       HPI     Darlene Lucas is a 52 y.o. female who presents as a returning patient (last seen by Dr. Odis Luster on 11/04/2021) to [facility] for follow up of alopecia. At last visit, patient was to continue spironolactone 100 mg daily and minoxidil PO 1.25 mg daily for alopecia. She was also to continue tretinoin 0.05% cream nightly for milia.     Patient reports some improvement on oral minoxdil and spironolactone. Her hair is growing back, though it breaks off easily . She has not had any side effects from the medication. She uses tretinoin nightly as nee ed for milia    The patient denies any other new or changing lesions or areas of concern.     Pertinent Past Medical History     No history of skin cancer    Family History:   Negative for melanoma    Past Medical History, Family History, Social History, Medication List, Allergies, and Problem List were reviewed in the rooming section of Epic.     ROS: Other than symptoms mentioned in the HPI, no fevers, chills, or other skin complaints    Physical Examination     GENERAL: Well-appearing female in no acute distress, resting comfortably.  NEURO: Alert and oriented, answers questions appropriately  PSYCH: Normal mood and affect  SKIN (Focal Skin Exam): Per patient request, examination of face, hair, scalp, ears, neck, back was performed  - mild thinning of hair diffusely mainly centered on occipital scalp and part line   - no milia noted on face today     All  areas not commented on are within normal limits or unremarkable      (Approved Template 12/03/2019)

## 2022-12-09 ENCOUNTER — Ambulatory Visit
Admit: 2022-12-09 | Discharge: 2022-12-10 | Payer: BLUE CROSS/BLUE SHIELD | Attending: Student in an Organized Health Care Education/Training Program | Primary: Student in an Organized Health Care Education/Training Program

## 2022-12-09 DIAGNOSIS — L720 Epidermal cyst: Principal | ICD-10-CM

## 2022-12-09 DIAGNOSIS — L658 Other specified nonscarring hair loss: Principal | ICD-10-CM

## 2022-12-09 MED ORDER — MINOXIDIL 2.5 MG TABLET
ORAL_TABLET | Freq: Every day | ORAL | 0 refills | 90.00000 days | Status: CP
Start: 2022-12-09 — End: 2023-12-09
  Filled 2022-12-21: qty 45, 90d supply, fill #0

## 2022-12-09 MED ORDER — SPIRONOLACTONE 100 MG TABLET
ORAL_TABLET | Freq: Two times a day (BID) | ORAL | 1 refills | 90.00000 days | Status: CN
Start: 2022-12-09 — End: 2023-06-07
  Filled 2022-12-21: qty 180, 90d supply, fill #0

## 2022-12-09 MED ORDER — FINASTERIDE 1 MG TABLET
ORAL_TABLET | Freq: Every day | ORAL | 0 refills | 90.00000 days | Status: CP
Start: 2022-12-09 — End: 2023-12-09
  Filled 2022-12-21: qty 90, 90d supply, fill #0

## 2022-12-09 MED ORDER — TRETINOIN 0.05 % TOPICAL CREAM
4 refills | 0.00000 days | Status: CN
Start: 2022-12-09 — End: ?

## 2022-12-13 NOTE — Unmapped (Signed)
Thank you for choosing [facility]!  We appreciate the opportunity to participate in your care.      If any questions or concerns arise after your visit, please do not hesitant to contact me by [facility] or by calling our clinical team at [phone].    Please let me know if I can be of assistance with this or other orthopaedic issues in the future.

## 2022-12-14 ENCOUNTER — Ambulatory Visit: Admit: 2022-12-14 | Discharge: 2022-12-15 | Payer: BLUE CROSS/BLUE SHIELD

## 2022-12-14 MED ORDER — DICLOFENAC 1.5 % TOPICAL DROPS
Freq: Four times a day (QID) | TOPICAL | 2 refills | 7 days | Status: CP | PRN
Start: 2022-12-14 — End: ?
  Filled 2023-01-11: qty 150, 7d supply, fill #0

## 2022-12-14 MED ADMIN — triamcinolone acetonide (KENALOG-40) injection 40 mg: 40 mg | INTRA_ARTICULAR | @ 14:00:00 | Stop: 2022-12-14

## 2022-12-14 NOTE — Unmapped (Signed)
Orthopaedics  Encounter Provider: Ancil Linsey, NP  Date of Service: 12/14/2022 Last encounter Orthopaedics: Visit date not found   Last encounter this provider: 09/16/2022      Notes:         Primary Care Provider: Desmond Dike, NP  Referring Provider: None Per Patient Pcp    ICD-10-CM   1. Arthritis of right subtalar joint  M19.071   2. Primary osteoarthritis of both knees  M17.0    Orthopaedic notes: - s/p right fibula shaft & syndesmosis ORIF and deltoid suture anchor repair on 11/10/17 (Attending: [facility])  - s/p right ankle arthroscopic debridement and removal of broken syndesmotic screw on 08/30/2018 (Attending: [facility])    Physical Function CAT Score: (not recorded)  Pain Interference CAT Score: (not recorded)  Depression CAT Score: (not recorded)  Sleep CAT Score: (not recorded)  [email or web address]     Darlene Lucas is a 52 y.o. female   ASSESSMENT   Right subtalar joint osteoarthritis        PLAN:     Patient would like to proceed with repeat steroid injection today as it has been very helpful for her in the past.  Risks of procedure were discussed.  The patient verbalized understanding and provided consent.  A timeout was performed prior to procedure identifying the correct patient, location, medication.  4 ml lidocaine and 40 mg kenalog were injected into the R subtalar joint using aseptic technique.  The patient tolerated this well.  Following the procedure, the patient reported decreased pain. Advised to avoid vigorous activities for the next few days and then the patient may proceed with activity as tolerated.  Discussed some stretches that should help with the plantar heel pain.  Follow-up will be as needed    Requested Prescriptions     Signed Prescriptions Disp Refills    diclofenac sodium 1.5 % Drop 150 mL 2     Sig: Apply 40 drops topically four (4) times a day as needed (pain).      No orders of the defined types were placed in this encounter.      History:  For visit: Right ankle pain    HPI:  52 y.o. female who reports that her ankle pain has started to return.  Steroid injections have always been helpful.  Additionally she is having some new plantar heel pain, worse with the for steps of the day.  Requests script for Pennsaid.       Medical History Past Medical History:   Diagnosis Date    Abnormal Pap smear of cervix     Abnormal uterine bleeding     Was told possible adenomyosis, possible need for hysterectomy by Dr. Alvino Chapel. Had [place], and trial of 90 days of provera. 11/29/13 TVUS 5x6x4cm RO 2x1.5x1.5 LO 2x2x 1.6, EMS 5mm, abundant flow near endometrium c/w possible adenomyosis     Allergic Age 52    Anemia     Arthritis     Asthma     BMI 50.0-59.9, adult (CMS-HCC)     Chronic back pain     Constipation     Difficult intravenous access     Dysplasia of cervix, high grade CIN 2     Edema     Elevated LFTs     Environmental allergies     Eye trauma 2020    fractured orbit. Unsure which eye.    GERD (gastroesophageal reflux disease)     Goiter 2015    monitoring levels  Hyperlipidemia     Hypokalemia     Prediabetes     Pulmonary embolism (CMS-HCC) 1989, 1992    following miscarriages     Recurrent UTI     Right leg pain 10/2017    right fibula fracture    Thyroid function study abnormality 2015    seeing endocrine (TSH 5.37 12/25/13, T4 1.02      Surgical History Past Surgical History:   Procedure Laterality Date    ENDOMETRIAL BIOPSY      11/2013    HYSTERECTOMY      Knee arthroscopy Left 1987    LUMBAR FUSION      L5 - S1 - multiple - also had hardware removed    LUMBAR FUSION  2008    L1-S1    PR ANKLE SCOPE,EXTENS DEBRIDEMNT Right 08/30/2018    Procedure: ARTHROSCOPY ANK SURG; DEBRID EXTEN - modifier 22;  Surgeon: Valda Favia, MD;  [facility]: [facility];  Service: Orthopedics    PR ARTHROCENTESIS ASPIR&/INJ MAJOR JT/BURSA W/O [place] Right 08/30/2018    Procedure: ARTHROCENTESIS, ASPIRATION AND/OR INJECTION; MAJOR JOINT OR BURSA (E.G.SHOULDER/HIP/KNEE);  Surgeon: Valda Favia, MD;  Location: [facility] OR [facility];  Service: Orthopedics    PR BIOPSY OF VAGINA,EXTENSIVE N/A 09/20/2014    Procedure: BIOPSY VAGINAL MUCOSA; EXTENSIVE, REQUIRING SUTURE (INCLUDING CYSTS);  Surgeon: Hollice Gong, MD;  Location: [facility];  Service: Advanced Laparoscopy    PR BIOPSY/EXCISION, LYMPH NODE(S) N/A 09/20/2014    Procedure: BX/EXC LYMPH NODE; OPEN, SUPERF (SEPART PROC);  Surgeon: Hollice Gong, MD;  [facility];  Service: Advanced Laparoscopy    PR CLOSED TREATMENT PST MALLEOLUS FRACTURE W/O MANIP Right 11/10/2017    Procedure: CLOSED TREATMENT OF POSTERIOR MALLEOLUS FRACTURE; WITHOUT MANIPULATION;  Surgeon: Valda Favia, MD;  Location: [facility] OR [facility];  Service: Ortho Foot & Ankle    PR COLPOSCOPY,ENTIRE VAGINA,W/BIOPSY(S) N/A 09/20/2014    Procedure: COLPOSCOPY OF THE ENTIRE VAGINA, WITH CERVIX IF PRESENT; WITH BIOPSY(S) OF VAGINA/CERVIX;  Surgeon: Hollice Gong, MD;  Location: [facility] OR [facility];  Service: Advanced Laparoscopy    PR COLSC FLX W/RMVL OF TUMOR POLYP LESION SNARE TQ N/A 12/20/2019    Procedure: COLONOSCOPY FLEX; W/REMOV TUMOR/LES BY SNARE;  Surgeon: Jules Husbands, MD;  [facility]: [facility];  Service: Gastroenterology    PR CYSTOURETHROSCOPY N/A 06/13/2014    Procedure: CYSTOURETHROSCOPY (SEPARATE PROCEDURE);  Surgeon: Hollice Gong, MD;  Location[facility];  Service: Advanced Laparoscopy    PR KNEE SCOPE,MED OR LAT MENIS REPAIR Right 09/15/2021    Procedure: ARTHROSCOPY, KNEE, SURGICAL; WITH MENISCUS REPAIR (MEDIAL OR LATERAL);  Surgeon: Nickola Major, MD;  Location: OR [facility];  Service: Orthopedics    PR LAP,VAG HYST,UTERUS 250GMS/<,SALP-OOPH N/A 06/13/2014    Procedure: ROBOTIC LAPAROSCOPY, SURGICAL, W/VAGINAL HYSTERECTOMY, UTERUS 250 GRAMS OR LESS; W/REMOVAL OF TUBE(S) &/OR OVARY(S);  Surgeon: Hollice Gong, MD; Location: [facility] OR [facility];  Service: Advanced Laparoscopy    PR OPEN TREATMENT PROXIMAL FIBULA/SHAFT FRACTURE Right 11/10/2017    Procedure: OPEN TREATMENT OF PROXIMAL FIBULA OR SHAFT FRACTURE, INCLUDES INTERNAL FIXATION, WHEN PERFORMED - Modifier 22;  Surgeon: Valda Favia, MD;  Location: [facility] OR [facility];  Service: Ortho Foot & Ankle    PR OPEN TX DISTAL TIBIOFIBULAR JOINT DISRUPTION Right 11/10/2017    Procedure: OPEN TREATMENT OF DISTAL TIBIOFIBULAR JOINT (SYNDESMOSIS) DISRUPTION, INCLUDES INTERNAL FIXATION, IF DONE;  Surgeon: Valda Favia, MD;  Location: [facility] OR [facility];  Service:  Ortho Foot & Ankle    PR PELVIC EXAMINATION W ANESTH N/A 09/20/2014    Procedure: PELVIC EXAMINATION UNDER ANESTHESIA (OTHER THAN LOCAL);  Surgeon: Hollice Gong, MD;  Location: [facility] OR [facility];  Service: Advanced Laparoscopy    PR REMOVAL IMPLANT DEEP Right 08/30/2018    Procedure: R20--REMOVE IMPLANT; DEEP--RIGHT ANKLE x 2 - modifier 22;  Surgeon: Valda Favia, MD;  Location: [facility] OR [facility];  Service: Orthopedics    PR REPAIR 1 COLLAT ANKLE LIGMNT,PRIMARY Right 11/10/2017    Procedure: REPAIR, PRIMARY, DISRUPTED LIGAMENT, ANKLE; COLLATERAL;  Surgeon: Valda Favia, MD;  Location: [facility] OR [facility];  Service: Ortho Foot & Ankle    PR UPPER GI ENDOSCOPY,BIOPSY N/A 12/20/2019    Procedure: UGI ENDOSCOPY; WITH BIOPSY, SINGLE OR MULTIPLE;  Surgeon: Jules Husbands, MD;  [facility]: [facility];  Service: Gastroenterology    SI joint surgery  2004    Bilateral     TONSILLECTOMY      TUBAL LIGATION        Allergies Iohexol, Propoxyphene, Shellfish containing products, Sulfa (sulfonamide antibiotics), and Topiramate   Medications She has a current medication list which includes the following prescription(s): albuterol, aspirin, accu-chek guide test strips, calcium citrate-vitamin d3, celecoxib, celecoxib, diclofenac sodium, vaniqa, estrogens (conjugated), finasteride, fluticasone propionate, lancets, levothyroxine, metformin, minoxidil, montelukast, pantoprazole, pravastatin, spironolactone, mounjaro, mounjaro, and tretinoin.   Family History Her family history includes Allergies in her father; Cancer in her sister; Heart attack in her mother; No Known Problems in her daughter and daughter; Pulmonary embolism in her mother; Stroke in her mother.   Social History She reports that she has never smoked. She has never used smokeless tobacco. She reports that she does not currently use alcohol. She reports that she does not use drugs.Home address:[address]  [address]  [place] [address]  Occupation:         Occupational History    Not on file     Social History     Social History Narrative    Widowed, 07/05/2014    Has 2 children- ages 52, 24.    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                                    Exam:  The primary encounter diagnosis was Arthritis of right subtalar joint. A diagnosis of Primary osteoarthritis of both knees was also pertinent to this visit.   Estimated body mass index is 27.95 kg/m?? as calculated from the following:    Height as of 11/05/22: 154.9 cm (5' 1).    Weight as of 11/05/22: 67.1 kg (147 lb 14.9 oz).     Musculoskeletal   Tender over the mid calcaneal tuberosity.  No pain with calcaneal squeeze.  Dorsiflexion at the ankle with the knee extended to 5.  Nontender of Achilles.  Tender over the sinus Tarsi.       Pulses well-perfused distally, 2+ DP and posterior tibial pulses    Swelling no swelling    Neurologic Sensation to light touch distally normal   Skin Benign, no lesions        Test Results  The primary encounter diagnosis was Arthritis of right subtalar joint. A diagnosis of Primary osteoarthritis of both knees was also pertinent to  this visit.  Lab Results   Component Value Date    A1C 4.6 (L) 08/23/2022       No results found for: VITD    Imaging  No orders of the defined types were placed in this encounter.        DME ORDER:  Dx:  ,

## 2022-12-21 MED FILL — MONTELUKAST 10 MG TABLET: ORAL | 90 days supply | Qty: 90 | Fill #1

## 2022-12-24 ENCOUNTER — Ambulatory Visit: Admit: 2022-12-24 | Discharge: 2022-12-25 | Payer: BLUE CROSS/BLUE SHIELD

## 2022-12-24 NOTE — Unmapped (Signed)
Flu vaccine given RT deltoid IM without any problems or adverse reactions noted.

## 2023-01-04 DIAGNOSIS — E039 Hypothyroidism, unspecified: Principal | ICD-10-CM

## 2023-01-04 DIAGNOSIS — L658 Other specified nonscarring hair loss: Principal | ICD-10-CM

## 2023-01-04 DIAGNOSIS — N951 Menopausal and female climacteric states: Principal | ICD-10-CM

## 2023-01-04 MED ORDER — FINASTERIDE 1 MG TABLET
ORAL_TABLET | Freq: Every day | ORAL | 0 refills | 90.00000 days | Status: CP
Start: 2023-01-04 — End: 2024-01-04
  Filled 2023-03-28: qty 90, 90d supply, fill #0

## 2023-01-04 MED ORDER — SPIRONOLACTONE 100 MG TABLET
ORAL_TABLET | Freq: Two times a day (BID) | ORAL | 0 refills | 90.00000 days | Status: CP
Start: 2023-01-04 — End: 2023-07-03
  Filled 2023-03-28: qty 180, 90d supply, fill #0

## 2023-01-04 MED ORDER — LEVOTHYROXINE 50 MCG TABLET
ORAL_TABLET | Freq: Every day | ORAL | 1 refills | 90 days | Status: CP
Start: 2023-01-04 — End: 2024-01-04
  Filled 2023-01-11: qty 90, 90d supply, fill #0

## 2023-01-04 MED ORDER — CONJUGATED ESTROGENS 0.3 MG TABLET
ORAL_TABLET | Freq: Every day | ORAL | 1 refills | 90 days | Status: CP
Start: 2023-01-04 — End: 2024-01-04

## 2023-01-04 MED ORDER — TRETINOIN 0.05 % TOPICAL CREAM
4 refills | 0.00000 days | Status: CP
Start: 2023-01-04 — End: ?
  Filled 2023-03-14: qty 45, 30d supply, fill #0

## 2023-01-04 MED ORDER — MINOXIDIL 2.5 MG TABLET
ORAL_TABLET | Freq: Every day | ORAL | 0 refills | 90.00000 days | Status: CP
Start: 2023-01-04 — End: 2024-01-04
  Filled 2023-03-30: qty 45, 90d supply, fill #0

## 2023-01-04 MED ORDER — PREMARIN 0.625 MG/GRAM VAGINAL CREAM
Freq: Every day | VAGINAL | 11 refills | 60 days | Status: CP
Start: 2023-01-04 — End: 2024-01-04
  Filled 2023-01-11: qty 90, 90d supply, fill #0

## 2023-01-04 NOTE — Unmapped (Signed)
[facility] Refill Coordination Note    Specialty Lite Medication(s) to be Shipped:   Mounjaro    Other medication(s) to be shipped:  Kellogg strips, diclofenac sodium 1.5% drop, Premarin, fluticasone, Synthroid, Premarin vaginal,      Darlene Lucas, DOB: [birth year]  Phone: [phone] (home)       All above HIPAA information was verified with patient.     Was a Nurse, learning disability used for this call? No    Changes to medications: [name] reports no changes at this time.  Changes to insurance: No      REFERRAL TO PHARMACIST     Referral to the pharmacist: Not needed      [facility]     Shipping address confirmed in Epic.     Delivery Scheduled: Yes, Expected medication delivery date: 01/11/23.     Medication will be delivered via Same Day Courier to the prescription address in Epic WAM.    Darlene Lucas[facility] W [facility] Specialty Technician

## 2023-01-10 NOTE — Unmapped (Unsigned)
Assessment/Plan:    # Prediabetes without retinopathy {Desc; right eye/left ZOX:09604}    -Hgb A1C: 4.6 (08/23/22)  -Stressed compliance with meds/blood sugar control and follow-up w/ PCP as scheduled.  -RTC in 1 year for follow-up or sooner if changes in vision develop.    # Myopia {with:33855} astigmatism {with:33855} presbyopia {Desc; right eye/left VWU:98119}  -Rx {was / was not:36801} given @ *** visit.  -Observe  - RTC: {JPBFollow-up:89010} or sooner if changes in vision develop.

## 2023-01-11 MED FILL — MOUNJARO 12.5 MG/0.5 ML SUBCUTANEOUS PEN INJECTOR: SUBCUTANEOUS | 28 days supply | Qty: 2 | Fill #1

## 2023-01-11 MED FILL — FLUTICASONE PROPIONATE 50 MCG/ACTUATION NASAL SPRAY,SUSPENSION: NASAL | 60 days supply | Qty: 16 | Fill #2

## 2023-01-11 MED FILL — ACCU-CHEK GUIDE TEST STRIPS: 34 days supply | Qty: 50 | Fill #4

## 2023-01-11 MED FILL — PREMARIN 0.625 MG/GRAM VAGINAL CREAM: VAGINAL | 60 days supply | Qty: 30 | Fill #0

## 2023-01-17 NOTE — Unmapped (Signed)
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  -RTC in 1 year for follow-up or sooner if changes in vision develop.    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 Explained natural history of disease including increased risk for developing glaucoma including optic nerve damage and vision loss. Discussed that nerve damage or vision loss can occur without signs or symptoms. Discussed that treatment for glaucoma may be necessary in the future. All questions were answered. Recommended further evaluation.  -RTC in 3 months for: TA, pachymetry, HVF 24-2, OCT/RNFL, gonio    # Cataract both eyes  -mild  -Reviewed findings & diagnosis; all questions answered. Discussed options including observation, new corrective lenses, or surgery with Phaco with IOL.  -Pt. satisfied with vision and does not desire sx at this time.   -Rx for specs was given.   -monitor  -RTC PRN or sooner if vision changes.    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  -Observe  - RTC: PRN or sooner if changes in vision develop.    # Dry Eye Syndrome: both eyes  -Discussed findings & diagnosis with patient.  All questions were answered. Discussed treatment options.  -Patient given information w/ OTC artificial tears (AT) & lubricants listed.    --Plan:     --use AT as needed.    --RTC as scheduled or sooner if symptoms do not improve or if they worsen.    # Questionable amblyopia OU  - per patient  -baseline VA: right eye:20/20-; left eye: 20/25   - observe

## 2023-01-18 ENCOUNTER — Ambulatory Visit: Admit: 2023-01-18 | Discharge: 2023-01-19 | Payer: BLUE CROSS/BLUE SHIELD

## 2023-01-18 DIAGNOSIS — H04123 Dry eye syndrome of bilateral lacrimal glands: Principal | ICD-10-CM

## 2023-01-18 DIAGNOSIS — H53003 Unspecified amblyopia, bilateral: Principal | ICD-10-CM

## 2023-01-18 DIAGNOSIS — H40003 Preglaucoma, unspecified, bilateral: Principal | ICD-10-CM

## 2023-01-18 DIAGNOSIS — H52203 Unspecified astigmatism, bilateral: Principal | ICD-10-CM

## 2023-01-18 DIAGNOSIS — E119 Type 2 diabetes mellitus without complications: Principal | ICD-10-CM

## 2023-01-18 DIAGNOSIS — H524 Presbyopia: Principal | ICD-10-CM

## 2023-01-18 DIAGNOSIS — H2513 Age-related nuclear cataract, bilateral: Principal | ICD-10-CM

## 2023-01-25 ENCOUNTER — Ambulatory Visit: Admit: 2023-01-25 | Discharge: 2023-01-26 | Payer: BLUE CROSS/BLUE SHIELD | Attending: Family | Primary: Family

## 2023-01-25 DIAGNOSIS — H109 Unspecified conjunctivitis: Principal | ICD-10-CM

## 2023-01-25 MED ORDER — OFLOXACIN 0.3 % EYE DROPS
Freq: Four times a day (QID) | OPHTHALMIC | 0 refills | 5 days | Status: CP
Start: 2023-01-25 — End: 2023-01-30

## 2023-01-25 NOTE — Unmapped (Signed)
Name:  Darlene Lucas  DOB: [birth year]  Date: 01/25/2023    ASSESSMENT/PLAN:  Kerissa was seen today for eye problem.    Diagnoses and all orders for this visit:    Conjunctivitis, unspecified conjunctivitis type, unspecified laterality    Other orders  -     ofloxacin (OCUFLOX) 0.3 % ophthalmic solution; Administer 1 drop to both eyes four (4) times a day for 5 days.    Ofloxacin drops to both eyes qid x 5 days.  Warm compresses for comfort.  Stop Opcon now while using RX drops.    Follow up with ophthalmology if sx worsen/persists.          ------------------------------------------------------------------------------    Chief Complaint   Patient presents with    Eye Problem     Both eyes are itchy/irritated. Recently seen her eye dr for yearly check and brought up that she is having allergies. He rec lubricating eye drops and.  Hasn't helped. Yesterday she took zytrec because the puffiness around her eyes. This am she had crusty discharge. Having eye pain.       HPI: Darlene Lucas is a 52 y.o. female here with bilateral conjunctival redness; crusty when she wakes up.   Has been using Opcon twice/day.  Eyes are burning.  Vision OK  Allergic to sulfa    ROS:    Negative with exception of stated in HPI    I have reviewed past medical, surgical, medications, allergies, social and family histories today and updated them in Epic where appropriate.    PMH:  Past Medical History:   Diagnosis Date    Abnormal Pap smear of cervix     Abnormal uterine bleeding     Was told possible adenomyosis, possible need for hysterectomy by Dr. Alvino Chapel. Had [place], and trial of 90 days of provera. 11/29/13 TVUS 5x6x4cm RO 2x1.5x1.5 LO 2x2x 1.6, EMS 5mm, abundant flow near endometrium c/w possible adenomyosis     Allergic Age 70    Amblyopia 1974    Anemia     Arthritis     Asthma     Blepharitis November 2021    BMI 50.0-59.9, adult (CMS-HCC)     Chronic back pain     Constipation     Diabetes mellitus (CMS-HCC) 2020    Difficult intravenous access     Dry eyes     Dysplasia of cervix, high grade CIN 2     Edema     Elevated LFTs     Environmental allergies     Eye trauma 2020    fractured orbit. Unsure which eye.    GERD (gastroesophageal reflux disease)     Goiter 2015    monitoring levels    Headache     Hyperlipidemia     Hypertension 2021    Hypokalemia     Prediabetes     Pulmonary embolism (CMS-HCC) 1989, 1992    following miscarriages     Recurrent UTI     Right leg pain 10/2017    right fibula fracture    Thyroid function study abnormality 2015    seeing endocrine (TSH 5.37 12/25/13, T4 1.02       SURGICAL HX:  Past Surgical History:   Procedure Laterality Date    ENDOMETRIAL BIOPSY      11/2013    HYSTERECTOMY      Knee arthroscopy Left 1987    LUMBAR FUSION      L5 - S1 - multiple - also  had hardware removed    LUMBAR FUSION  2008    L1-S1    PR ANKLE SCOPE,EXTENS DEBRIDEMNT Right 08/30/2018    Procedure: ARTHROSCOPY ANK SURG; DEBRID EXTEN - modifier 22;  Surgeon: Valda Favia, MD;  Location: [facility] OR [facility];  Service: Orthopedics    PR ARTHROCENTESIS ASPIR&/INJ MAJOR JT/BURSA W/O [place] Right 08/30/2018    Procedure: ARTHROCENTESIS, ASPIRATION AND/OR INJECTION; MAJOR JOINT OR BURSA (E.G.SHOULDER/HIP/KNEE);  Surgeon: Valda Favia, MD;  [facility]: [facility] OR [facility];  Service: Orthopedics    PR BIOPSY OF VAGINA,EXTENSIVE N/A 09/20/2014    Procedure: BIOPSY VAGINAL MUCOSA; EXTENSIVE, REQUIRING SUTURE (INCLUDING CYSTS);  Surgeon: Hollice Gong, MD;  Location: [facility] OR [facility];  Service: Advanced Laparoscopy    PR BIOPSY/EXCISION, LYMPH NODE(S) N/A 09/20/2014    Procedure: BX/EXC LYMPH NODE; OPEN, SUPERF (SEPART PROC);  Surgeon: Hollice Gong, MD;  Location: [facility] OR [facility];  Service: Advanced Laparoscopy    PR CLOSED TREATMENT PST MALLEOLUS FRACTURE W/O MANIP Right 11/10/2017    Procedure: CLOSED TREATMENT OF POSTERIOR MALLEOLUS FRACTURE; WITHOUT MANIPULATION;  Surgeon: Valda Favia, MD;  Location: [facility] OR [facility];  Service: Ortho Foot & Ankle    PR COLPOSCOPY,ENTIRE VAGINA,W/BIOPSY(S) N/A 09/20/2014    Procedure: COLPOSCOPY OF THE ENTIRE VAGINA, WITH CERVIX IF PRESENT; WITH BIOPSY(S) OF VAGINA/CERVIX;  Surgeon: Hollice Gong, MD;  Location: [facility] OR [facility];  Service: Advanced Laparoscopy    PR COLSC FLX W/RMVL OF TUMOR POLYP LESION SNARE TQ N/A 12/20/2019    Procedure: COLONOSCOPY FLEX; W/REMOV TUMOR/LES BY SNARE;  Surgeon: Jules Husbands, MD;  [facility]: [facility];  Service: Gastroenterology    PR CYSTOURETHROSCOPY N/A 06/13/2014    Procedure: CYSTOURETHROSCOPY (SEPARATE PROCEDURE);  Surgeon: Hollice Gong, MD;  Location: [facility] OR [facility];  Service: Advanced Laparoscopy    PR KNEE SCOPE,MED OR LAT MENIS REPAIR Right 09/15/2021    Procedure: ARTHROSCOPY, KNEE, SURGICAL; WITH MENISCUS REPAIR (MEDIAL OR LATERAL);  Surgeon: Nickola Major, MD;  Location: OR [facility];  Service: Orthopedics    PR LAP,VAG HYST,UTERUS 250GMS/<,SALP-OOPH N/A 06/13/2014    Procedure: ROBOTIC LAPAROSCOPY, SURGICAL, W/VAGINAL HYSTERECTOMY, UTERUS 250 GRAMS OR LESS; W/REMOVAL OF TUBE(S) &/OR OVARY(S);  Surgeon: Hollice Gong, MD;  Location: [facility] OR [facility];  Service: Advanced Laparoscopy    PR OPEN TREATMENT PROXIMAL FIBULA/SHAFT FRACTURE Right 11/10/2017    Procedure: OPEN TREATMENT OF PROXIMAL FIBULA OR SHAFT FRACTURE, INCLUDES INTERNAL FIXATION, WHEN PERFORMED - Modifier 22;  Surgeon: Valda Favia, MD;  Location: [facility];  Service: Ortho Foot & Ankle    PR OPEN TX DISTAL TIBIOFIBULAR JOINT DISRUPTION Right 11/10/2017    Procedure: OPEN TREATMENT OF DISTAL TIBIOFIBULAR JOINT (SYNDESMOSIS) DISRUPTION, INCLUDES INTERNAL FIXATION, IF DONE;  Surgeon: Valda Favia, MD;  Location: [facility] OR [facility];  Service: Ortho Foot & Ankle    PR PELVIC EXAMINATION W ANESTH N/A 09/20/2014    Procedure: PELVIC EXAMINATION UNDER ANESTHESIA (OTHER THAN LOCAL);  Surgeon: Hollice Gong, MD;  Location: [facility] OR [facility];  Service: Advanced Laparoscopy    PR REMOVAL IMPLANT DEEP Right 08/30/2018    Procedure: R20--REMOVE IMPLANT; DEEP--RIGHT ANKLE x 2 - modifier 22;  Surgeon: Valda Favia, MD;  Location: [facility];  Service: Orthopedics    PR REPAIR 1 COLLAT ANKLE LIGMNT,PRIMARY Right 11/10/2017    Procedure: REPAIR, PRIMARY, DISRUPTED LIGAMENT, ANKLE; COLLATERAL;  Surgeon: Valda Favia, MD;  Location: [facility] OR [facility];  Service: Ortho Foot & Ankle    PR  UPPER GI ENDOSCOPY,BIOPSY N/A 12/20/2019    Procedure: UGI ENDOSCOPY; WITH BIOPSY, SINGLE OR MULTIPLE;  Surgeon: Jules Husbands, MD;  [facility]: [facility];  Service: Gastroenterology    SI joint surgery  2004    Bilateral     TONSILLECTOMY      TUBAL LIGATION         MEDS:    Current Outpatient Medications:     celecoxib (CELEBREX) 200 MG capsule, Take 1 capsule (200 mg total) by mouth two (2) times a day., Disp: 180 capsule, Rfl: 3    chlorhexidine (PERIDEX) 0.12 % solution, RINSE WITH CAPFUL ONCE DAILY THEN SPIT. DO NOT RINSE, EAT, OR DRINK 30 MINUTES AFTER USE, Disp: , Rfl:     estrogens, conjugated, (PREMARIN) 0.3 MG tablet, Take 1 tablet (0.3 mg total) by mouth daily., Disp: 90 tablet, Rfl: 1    finasteride (PROPECIA) 1 mg tablet, Take 1 tablet (1 mg total) by mouth daily., Disp: 90 tablet, Rfl: 0    fluticasone propionate (FLONASE) 50 mcg/actuation nasal spray, Use 1 spray in each nostril once daily., Disp: 16 g, Rfl: 3    levothyroxine (SYNTHROID) 50 MCG tablet, Take 1 tablet (50 mcg total) by mouth daily., Disp: 90 tablet, Rfl: 1    metFORMIN (GLUCOPHAGE-XR) 500 MG 24 hr tablet, Take 2 tablets (1,000 mg total) by mouth daily with evening meal., Disp: 120 tablet, Rfl: 5    minoxidil (LONITEN) 2.5 MG tablet, Take 1/2 tablet (1.25 mg total) by mouth daily., Disp: 45 tablet, Rfl: 0    montelukast (SINGULAIR) 10 mg tablet, Take 1 tablet (10 mg total) by mouth daily., Disp: 90 tablet, Rfl: 3    pantoprazole (PROTONIX) 40 MG tablet, Take 1 tablet (40 mg total) by mouth two (2) times a day., Disp: 180 tablet, Rfl: 1    pravastatin (PRAVACHOL) 40 MG tablet, Take 1 tablet (40 mg total) by mouth nightly., Disp: 90 tablet, Rfl: 1    spironolactone (ALDACTONE) 100 MG tablet, Take 1 tablet (100 mg total) by mouth two (2) times a day., Disp: 180 tablet, Rfl: 0    tirzepatide (MOUNJARO) 10 mg/0.5 mL PnIj, Inject 10 mg under the skin every seven (7) days., Disp: 2 mL, Rfl: 3    tirzepatide (MOUNJARO) 12.5 mg/0.5 mL PnIj, Inject the contents of 1 pen (12.5 mg) under the skin once a week., Disp: 2 mL, Rfl: 6    albuterol HFA 90 mcg/actuation inhaler, Inhale 2 puffs every six (6) hours as needed for wheezing., Disp: 54 g, Rfl: 1    aspirin 81 MG chewable tablet, Chew 1 tablet (81 mg total) daily., Disp: 30 tablet, Rfl: 0    blood sugar diagnostic (ACCU-CHEK GUIDE TEST STRIPS) Strp, Use to check blood sugar once daily as directed, Disp: 100 strip, Rfl: 3    calcium citrate-vitamin D3 (CITRACAL + D MAXIMUM) 315 mg-6.25 mcg (250 unit) per tablet, Take 1 tablet by mouth Two (2) times a day., Disp: 60 tablet, Rfl: 1    celecoxib (CELEBREX) 200 MG capsule, Take 1 capsule (200 mg total) by mouth Two (2) times a day. May take 1 additional dose as needed for acute pain., Disp: 90 capsule, Rfl: 3    conjugated estrogens (PREMARIN) 0.625 mg/gram vaginal cream, Insert 0.5 g into the vagina daily., Disp: 30 g, Rfl: 11    diclofenac sodium 1.5 % Drop, Apply 40 drops topically four (4) times a day as needed (pain)., Disp: 150 mL, Rfl: 2  eflornithine (VANIQA) 13.9 % cream, Apply at night as directed (Patient not taking: Reported on 11/05/2022), Disp: 45 g, Rfl: 0    lancets (ACCU-CHEK SOFTCLIX LANCETS) Misc, Use to check blood sugar once daily as directed, Disp: 100 each, Rfl: 3    ofloxacin (OCUFLOX) 0.3 % ophthalmic solution, Administer 1 drop to both eyes four (4) times a day for 5 days., Disp: 1 mL, Rfl: 0    tretinoin (RETIN-A) 0.05 % cream, Apply a pea sized amount at night 2-3 times a week x 2 weeks, then every other night for 2 weeks, then nightly. If getting dry, use a facial moisturizer in the morning such as Oil of Olay complete sensitive skin or CereVe AM, Disp: 45 g, Rfl: 4    ALL:  Allergies   Allergen Reactions    Iohexol Anaphylaxis      Desc: IVP DYE   OK WITH 13 HR PREP    Propoxyphene Hives     Uncoded Allergy. Allergen: SHELLFISH    Shellfish Containing Products Anaphylaxis     Patient is ok with topical betadine.    Sulfa (Sulfonamide Antibiotics) Hives    Topiramate Other (See Comments)     Other Reaction: OTHER REACTION  Other Reaction: OTHER REACTION  Increase in HR       SH:  Social History     Tobacco Use    Smoking status: Never    Smokeless tobacco: Never   Vaping Use    Vaping status: Never Used   Substance Use Topics    Alcohol use: Not Currently     Alcohol/week: 0.0 standard drinks of alcohol     Comment: rare    Drug use: Never       FH:  Family History   Problem Relation Age of Onset    Pulmonary embolism Mother     Heart attack Mother     Stroke Mother     Allergies Father     Cancer Sister         cervical cancer    No Known Problems Daughter     No Known Problems Daughter     Diabetes Paternal Grandfather     Diabetes Paternal Grandmother     Diabetes Sister     Thyroid disease Sister     Clotting disorder Neg Hx     Anesthesia problems Neg Hx     Bleeding Disorder Neg Hx     Melanoma Neg Hx     Basal cell carcinoma Neg Hx     Squamous cell carcinoma Neg Hx     Breast cancer Neg Hx     Glaucoma Neg Hx     Macular degeneration Neg Hx        VITALS:  Vitals:    01/25/23 1307   BP: 125/86   Pulse: 102   Resp: 14   Temp: 36.7 ??C (98.1 ??F)   SpO2: 98%     Body mass index is 27.78 kg/m??.    Physical Exam  Constitutional: Alert and oriented. Well appearing and in no acute distress.  Eyes: Conjunctivae are red, drainage by history but not apparent. Vision OK, no orbital pain  ENT       Head: Normocephalic and atraumatic.       Ears: Nontender, normal canal, normal TMs bilaterally       Nose: No congestion/rhinnorhea.       Mouth/Throat: Mucous membranes are moist. Oropharynx non-erythematous.  Neck: No stridor. Supple without meningismus.  Hematological/Lymphatic/Immunilogical: No cervical lymphadenopathy.  Cardiovascular: Normal rate, regular rhythm. Grossly normal heart sounds.  Good peripheral circulation.  Respiratory: Normal respiratory effort without tachypnea nor retractions. No wheezes, rales, rhonchi.  Musculoskeletal:  Ambulatory with steady gait.   Neurologic:  Normal speech and language.   Skin:  Skin is warm, dry and intact. No rash noted.  Psychiatric: Mood and affect are normal. Speech and behavior are normal. Patient exhibits appropriate insight and judgment    TEST  RESULTS:    No results found for this visit on 01/25/23.      Other    ophthalmology    Victory Dakin, FNP-C  [facility] [place]  ----------------------------------------------------------------  Note - This record has been created using [employer]. Chart creation errors have been sought, but may not always have been located. Such creation errors do not reflect on the standard of medical care.

## 2023-01-25 NOTE — Unmapped (Addendum)
Stop the Opcon (which should only be 1 drop ONCE/day)    You were prescribed Ofloxacin eye drops for conjunctivitis.  Take as directed.     Follow up with ophthalmology if your symptoms worsen     Can apply warm compresses for relief from burning

## 2023-02-02 MED ORDER — PRAVASTATIN 40 MG TABLET
ORAL_TABLET | Freq: Every evening | ORAL | 1 refills | 90 days | Status: CP
Start: 2023-02-02 — End: 2024-02-02
  Filled 2023-02-07: qty 90, 90d supply, fill #0

## 2023-02-02 MED ORDER — LANCETS
3 refills | 0 days | Status: CP
Start: 2023-02-02 — End: ?
  Filled 2023-02-07: qty 100, 34d supply, fill #0

## 2023-02-02 NOTE — Unmapped (Signed)
[facility] Refill Coordination Note    Specialty Lite Medication(s) to be Shipped:   Mounjaro 10mg /0.38ml    Other medication(s) to be shipped:  accu-chek lancets (faxed for refills), celecoxib 200mg , metformin 500mg , pravastatin 40mg  (faxed for refills)     Darlene Lucas, DOB: 1970-09-09  Phone: [phone] (home)       All above HIPAA information was verified with patient.     Was a Nurse, learning disability used for this call? No    Changes to medications: [name] reports no changes at this time.  Changes to insurance: No      REFERRAL TO PHARMACIST     Referral to the pharmacist: Not needed      [facility]     Shipping address confirmed in Epic.     Delivery Scheduled: Yes, Expected medication delivery date: 02/07/23.     Medication will be delivered via Same Day Courier to the prescription address in Epic WAM.    [name]   [facility] Specialty Technician

## 2023-02-07 MED FILL — MOUNJARO 10 MG/0.5 ML SUBCUTANEOUS PEN INJECTOR: SUBCUTANEOUS | 28 days supply | Qty: 2 | Fill #3

## 2023-02-07 MED FILL — METFORMIN ER 500 MG TABLET,EXTENDED RELEASE 24 HR: ORAL | 90 days supply | Qty: 180 | Fill #0

## 2023-02-07 MED FILL — CELECOXIB 200 MG CAPSULE: ORAL | 90 days supply | Qty: 180 | Fill #3

## 2023-02-09 ENCOUNTER — Telehealth: Admit: 2023-02-09 | Discharge: 2023-02-10 | Payer: BLUE CROSS/BLUE SHIELD | Attending: Family | Primary: Family

## 2023-02-09 DIAGNOSIS — J0100 Acute maxillary sinusitis, unspecified: Principal | ICD-10-CM

## 2023-02-09 MED ORDER — AMOXICILLIN 875 MG-POTASSIUM CLAVULANATE 125 MG TABLET
ORAL_TABLET | Freq: Two times a day (BID) | ORAL | 0 refills | 10 days | Status: CP
Start: 2023-02-09 — End: 2023-02-19

## 2023-02-09 NOTE — Unmapped (Signed)
[facility] Telephonic/Telehealth noteDan Humphreys, [place] [address]. Phone [phone]    Date of Service:  02/10/2023    Darlene Lucas    Date of Birth:  03/06/1971    Chief Complaint   Patient presents with    Sinusitis     States she thinks it started with Conjunctivitis, and then turned in to a severe Sinus Infection. States her entire left side of her face is sore and inflamed, c/o sore throat and left ear pain.  C/o thick to thin mucus that runs down her throat. States sinus pressure is relieved a little in a hot shower, c/o sore throat when drinking cold liquids. Tested for Covid yesterday and last week and tests were negative. States that if she is prescribed an antibiotic, please prescribe Fluconazole as well       I spent 13 minutes on video with the patient. I spent an additional 10 minutes on pre- and post-visit activities.     The patient was physically located in [place] or a state in which I am permitted to provide care. The visit was reasonable and appropriate under the circumstances given the patient's presentation at the time.    She has been advised of the potential risks and limitations of this mode of treatment (including, but not limited to, the absence of in-person examination) and has agreed to be treated using telemedicine. The patient's questions regarding telemedicine have been answered.     She has been advised to contact their provider???s office for worsening conditions, and seek emergency medical treatment and/or call 911 if the patient deems either necessary.      Assessment/Plan:    Darlene Lucas is a pleasant 52 y.o. female with PMHx of GERD, Goiter, hypothyroidism, allergic rhinitis, HTN, PE, ulnar neuropathy, primary osteoarthritis of both knees, cervical neoplasia grade 2, prediabetes, HLD, obesity, chronic pin syndrome. She presents via video to report recent episode of conjunctivitis w/ ongoing left-sided otalgia, sore throat, nasal congestion, productive cough w/ mixed thin and pasty mucous, home fever, and facial pressure despite Tx w/ Ocuflox via UC 01/25/23. Given chronicity of symptoms will Tx w/ Augmentin 875-125 mg BID x10 days, potential benefits vs risks were reviewed and Rx sent to patient's preferred pharmacy. Will also send Rx for diflucan for BV prophylaxis per patient request. Symptomatic management reviewed, good hydration with water encouraged. ER precautions discussed. Follow up if symptoms fail to improve or worsen.     Diagnosis ICD-10-CM Associated Orders   1. Acute non-recurrent maxillary sinusitis  J01.00 amoxicillin-clavulanate (AUGMENTIN) 875-125 mg per tablet      2. Acute vaginitis  N76.0 fluconazole (DIFLUCAN) 150 MG tablet           HPI:  Darlene Lucas is a 52 y.o. female who presents via video for evaluation regarding sore throat. Reports persistent sore throat, nasal congestion, left-sided otalgia, productive cough w/ mixed thin and pasty mucous, and left-sided facial pressure following recent episode of conjunctivitis. Was seen at [facility] 01/25/23 and Tx'd w/ ofloxacin with mild benefit per patient. Home COVID-19 testing negative 11/19 and x1 week ago. Patient has not required emergency room treatment for these symptoms, and has not required hospitalization.     Denies chest pain, N/V/D, bowel or bladder issues.    ROS: 12 systems reviewed, no other listed complaints.    Pertinent Past Med Hx:    Past Medical History:   Diagnosis Date    Abnormal Pap smear of cervix  Abnormal uterine bleeding     Was told possible adenomyosis, possible need for hysterectomy by Dr. Alvino Chapel. Had [place], and trial of 90 days of provera. 11/29/13 TVUS 5x6x4cm RO 2x1.5x1.5 LO 2x2x 1.6, EMS 5mm, abundant flow near endometrium c/w possible adenomyosis     Allergic Age 77    Amblyopia 1974    Anemia     Arthritis     Asthma     Blepharitis November 2021    BMI 50.0-59.9, adult (CMS-HCC)     Chronic back pain     Constipation     Diabetes mellitus (CMS-HCC) 2020    Difficult intravenous access     Dry eyes     Dysplasia of cervix, high grade CIN 2     Edema     Elevated LFTs     Environmental allergies     Eye trauma 2020    fractured orbit. Unsure which eye.    GERD (gastroesophageal reflux disease)     Goiter 2015    monitoring levels    Headache     Hyperlipidemia     Hypertension 2021    Hypokalemia     Prediabetes     Pulmonary embolism (CMS-HCC) 1989, 1992    following miscarriages     Recurrent UTI     Right leg pain 10/2017    right fibula fracture    Thyroid function study abnormality 2015    seeing endocrine (TSH 5.37 12/25/13, T4 1.02       Medications:     Current Outpatient Medications:     albuterol HFA 90 mcg/actuation inhaler, Inhale 2 puffs every six (6) hours as needed for wheezing., Disp: 54 g, Rfl: 1    blood sugar diagnostic (ACCU-CHEK GUIDE TEST STRIPS) Strp, Use to check blood sugar once daily as directed, Disp: 100 strip, Rfl: 3    calcium citrate-vitamin D3 (CITRACAL + D MAXIMUM) 315 mg-6.25 mcg (250 unit) per tablet, Take 1 tablet by mouth Two (2) times a day., Disp: 60 tablet, Rfl: 1    celecoxib (CELEBREX) 200 MG capsule, Take 1 capsule (200 mg total) by mouth Two (2) times a day. May take 1 additional dose as needed for acute pain., Disp: 90 capsule, Rfl: 3    celecoxib (CELEBREX) 200 MG capsule, Take 1 capsule (200 mg total) by mouth two (2) times a day., Disp: 180 capsule, Rfl: 3    chlorhexidine (PERIDEX) 0.12 % solution, RINSE WITH CAPFUL ONCE DAILY THEN SPIT. DO NOT RINSE, EAT, OR DRINK 30 MINUTES AFTER USE, Disp: , Rfl:     conjugated estrogens (PREMARIN) 0.625 mg/gram vaginal cream, Insert 0.5 g into the vagina daily., Disp: 30 g, Rfl: 11    diclofenac sodium 1.5 % Drop, Apply 40 drops topically four (4) times a day as needed (pain)., Disp: 150 mL, Rfl: 2    estrogens, conjugated, (PREMARIN) 0.3 MG tablet, Take 1 tablet (0.3 mg total) by mouth daily., Disp: 90 tablet, Rfl: 1    finasteride (PROPECIA) 1 mg tablet, Take 1 tablet (1 mg total) by mouth daily., Disp: 90 tablet, Rfl: 0    fluticasone propionate (FLONASE) 50 mcg/actuation nasal spray, Use 1 spray in each nostril once daily., Disp: 16 g, Rfl: 3    lancets (ACCU-CHEK SOFTCLIX LANCETS) Misc, Use to check blood sugar once daily as directed, Disp: 100 each, Rfl: 3    levothyroxine (SYNTHROID) 50 MCG tablet, Take 1 tablet (50 mcg total) by mouth daily., Disp: 90 tablet, Rfl: 1  metFORMIN (GLUCOPHAGE-XR) 500 MG 24 hr tablet, Take 2 tablets (1,000 mg total) by mouth daily with evening meal., Disp: 120 tablet, Rfl: 5    minoxidil (LONITEN) 2.5 MG tablet, Take 1/2 tablet (1.25 mg total) by mouth daily., Disp: 45 tablet, Rfl: 0    montelukast (SINGULAIR) 10 mg tablet, Take 1 tablet (10 mg total) by mouth daily., Disp: 90 tablet, Rfl: 3    pravastatin (PRAVACHOL) 40 MG tablet, Take 1 tablet (40 mg total) by mouth nightly., Disp: 90 tablet, Rfl: 1    spironolactone (ALDACTONE) 100 MG tablet, Take 1 tablet (100 mg total) by mouth two (2) times a day., Disp: 180 tablet, Rfl: 0    tirzepatide (MOUNJARO) 10 mg/0.5 mL PnIj, Inject 10 mg under the skin every seven (7) days., Disp: 2 mL, Rfl: 3    tirzepatide (MOUNJARO) 12.5 mg/0.5 mL PnIj, Inject the contents of 1 pen (12.5 mg) under the skin once a week., Disp: 2 mL, Rfl: 6    tretinoin (RETIN-A) 0.05 % cream, Apply a pea sized amount at night 2-3 times a week x 2 weeks, then every other night for 2 weeks, then nightly. If getting dry, use a facial moisturizer in the morning such as Oil of Olay complete sensitive skin or CereVe AM, Disp: 45 g, Rfl: 4    amoxicillin-clavulanate (AUGMENTIN) 875-125 mg per tablet, Take 1 tablet by mouth two (2) times a day for 10 days., Disp: 20 tablet, Rfl: 0    aspirin 81 MG chewable tablet, Chew 1 tablet (81 mg total) daily., Disp: 30 tablet, Rfl: 0    eflornithine (VANIQA) 13.9 % cream, Apply at night as directed (Patient not taking: Reported on 11/05/2022), Disp: 45 g, Rfl: 0    fluconazole (DIFLUCAN) 150 MG tablet, Take 1 tablet (150 mg total) by mouth once for 1 dose. Take after completion of antibiotic., Disp: 1 tablet, Rfl: 0    pantoprazole (PROTONIX) 40 MG tablet, Take 1 tablet (40 mg total) by mouth two (2) times a day. (Patient not taking: Reported on 02/09/2023), Disp: 180 tablet, Rfl: 1     Allergies:   Allergies   Allergen Reactions    Iohexol Anaphylaxis      Desc: IVP DYE   OK WITH 13 HR PREP    Propoxyphene Hives     Uncoded Allergy. Allergen: SHELLFISH    Shellfish Containing Products Anaphylaxis     Patient is ok with topical betadine.    Sulfa (Sulfonamide Antibiotics) Hives    Topiramate Other (See Comments)     Other Reaction: OTHER REACTION  Other Reaction: OTHER REACTION  Increase in HR       Limited Exam:  Gen: WDWN NAD  Skin: Appears intact  Nose/Face: Appears congested, slight facial swelling noted  RESP: Even and Unlabored.    Diag:    Lab Results   Component Value Date    WBC 8.2 04/22/2022    HGB 13.4 04/22/2022    HCT 38.1 04/22/2022    PLT 294 04/22/2022       Lab Results   Component Value Date    NA 140 08/23/2022    K 3.7 08/23/2022    CL 109 (H) 08/23/2022    CO2 26.0 08/23/2022    BUN 16 08/23/2022    CREATININE 0.80 08/23/2022    GLU 83 08/23/2022    CALCIUM 9.5 08/23/2022       Lab Results   Component Value Date    BILITOT 1.8 (H) 08/23/2022  PROT 6.9 08/23/2022    ALBUMIN 4.0 08/23/2022    ALT 15 08/23/2022    AST 17 08/23/2022    ALKPHOS 43 (L) 08/23/2022       No results found for: PT, INR, APTT     I attest that I, Benita Stabile, personally documented this note while acting as scribe for Olena Leatherwood, NP.      Benita Stabile, Scribe.  02/09/2023     The documentation recorded by the scribe accurately reflects the service I personally performed and the decisions made by me.     Olena Leatherwood, NP    Dr. Betha Loa, DNP, FNP-BC  Board Certified Doctor of Nursing Practice   [phone]

## 2023-02-10 MED ORDER — FLUCONAZOLE 150 MG TABLET
ORAL_TABLET | Freq: Once | ORAL | 0 refills | 1.00000 days | Status: CP
Start: 2023-02-10 — End: 2023-02-10

## 2023-02-10 NOTE — Unmapped (Signed)
Patient is requesting the following refill  Requested Prescriptions     Pending Prescriptions Disp Refills    fluconazole (DIFLUCAN) 150 MG tablet 2 tablet 0     Sig: Take 1 tablet (150 mg total) by mouth once for 1 dose. May repeat in 2-3 days if needed       Recent Visits  Date Type Provider Dept   02/09/23 Telemedicine Desmond Dike, NP [facility] [address] [facility]   08/23/22 Office Visit Farrug, Emogene Morgan, NP [facility] [address] [facility]   04/22/22 Office Visit Nile Dear, Emogene Morgan, NP [facility] [address] [facility]   Showing recent visits within past 365 days and meeting all other requirements  Future Appointments  Date Type Provider Dept   02/25/23 Appointment Desmond Dike, NP [facility] [address] [facility]   Showing future appointments within next 365 days and meeting all other requirements       Labs: Not applicable this refill

## 2023-02-10 NOTE — Unmapped (Signed)
Copied from CRM #[id number]. Topic: Access To Clinicians - Medication Refill  >> Feb 10, 2023  8:31 AM [name] wrote:  The caller reports that they have previously requested refill from pharmacy, but have not received authorization yet.     The patient is requesting the following:     Medication(s) for refill: Diflucan   Quantity for 1 month supply    Pharmacy name and address: [facility] [phone] [place], [place] - [address]  [address] [place], [place] [address]  Phone: [phone]  Fax: [phone]       Please contact The patient by Cell Phone in regards to this request.    Coverage has been verified and updated if needed: yes    Medication request callback turnaround time: 72 business hours. Programmer, systems Notified)

## 2023-02-10 NOTE — Unmapped (Signed)
Addended by: Barbaraann Boys on: 02/10/2023 08:54 AM     Modules accepted: Orders

## 2023-02-11 MED ORDER — FLUCONAZOLE 150 MG TABLET
ORAL_TABLET | Freq: Once | ORAL | 0 refills | 1 days | Status: CP
Start: 2023-02-11 — End: 2023-02-11

## 2023-02-22 ENCOUNTER — Institutional Professional Consult (permissible substitution)
Admit: 2023-02-22 | Discharge: 2023-02-23 | Payer: BLUE CROSS/BLUE SHIELD | Attending: Audiologist | Primary: Audiologist

## 2023-02-22 NOTE — Unmapped (Signed)
[facility]  Department of Audiology    AUDIOLOGIC EVALUATION REPORT     PATIENT: Darlene Lucas, Darlene Lucas  DOB: 11-Jun-1970  MRN: [id number]  DOS: 02/22/2023    PLEASE SEE AUDIOGRAM INCLUDING FULL REPORT IN MEDIA MANAGER TAB.    HISTORY     Sorayah Geer is a 52 y.o. individual who presents to [facility] for an audiologic evaluation. Patient was accompanied by her service animal to today's appointment. Her medical history is significant for ear infections and PE tubes in childhood. Today, patient reports bilateral, chronic aural fullness, pressure and pain. Symptoms fluctuate and improve temporarily with use of allergy medication. She took Zyrtec D this morning in anticipation of this appointment but doesn't like to regularly take this medication. She recently had a sinus infection with ear infection on left. Infection is now resolved. She is interested in seeing an ENT. Patient reports tinnitus that she describes as humming sound. She reports hearing loss that she attributes to fluid behind the TM and history of recreational noise exposure (concerts).     Patient is referred by Desmond Dike.     Patient reports a history of:   positive history for ear infections/ear drainage/otorrhea    positive history for noise exposure - recreational (concerts)   positive family history of hearing loss   positive history of ear surgery - PE tubes   negative history of amplification    Today patient reports the following symptoms:   positive for otalgia    positive for aural fullness   positive for dizziness   positive for tinnitus - bilateral, humming   positive for hearing loss - R>L, TV volume too loud, missing the sound of the doorbell    RESULTS     Otoscopy  RIGHT Ear: clear external auditory canal  LEFT Ear: clear external auditory canal    Tympanometry - using a 226 Hz probe tone  RIGHT Ear: Type A tympanogram, consistent with normal middle ear pressure, compliance, and volume  LEFT Ear: Type A tympanogram, consistent with normal middle ear pressure, compliance, and volume      Pure Tone and Speech Audiometry  Today's behavioral evaluation was completed using conventional audiometry via insert earphones with good reliability.     Audiometric Results:      RIGHT Ear: mild hearing loss at 125-500 Hz rising to within normal hearing sensitivity by 1000-2000 Hz and sloping to mild sensorineural hearing loss at 3000-8000 Hz  Speech Reception Threshold (SRT): 25 dB HL  Word Recognition Score (using Recorded NU-6 words, Ordered by Difficulty):  greater than or equal to 96% at 60 dB HL    LEFT Ear: mild sensorineural hearing loss at 125-250 Hz rising to within normal hearing sensitivity by [phone] Hz sloping to mild hearing loss at 8000 Hz  Speech Reception Threshold (SRT): 20 dB HL  Word Recognition Score (using Recorded NU-6 words, Ordered by Difficulty): greater than or equal to 96% at 60 dB HL    IMPRESSIONS     An asymmetry is noted in thresholds between ears at 3000 Hz.     Patient was counseled on today's results via verbal communication and expressed understanding.  No barriers to education were identified.    RECOMMENDATIONS      Refer to ENT re: chronic aural fullness and otalgia, history of ear infections and PE tubes, asymmetry at 3000 Hz    Return to clinic in approximately 12 months for continued monitoring of hearing, sooner should concerns arise  Consider Hearing Aid (HA) consultation with [facility] team    Lorella Nimrod, B.S.  Audiology Graduate Student Clinician    I was physically present and immediately available to direct and supervise tasks that were related to patient management. The direction and supervision was continuous throughout the time these tasks were performed.    Collene Leyden, AUD, CCC-A  Clinical Audiologist  [facility] Program  Scheduling [phone]    Procedure(s):    CPT [phone] - Tympanometry    CPT [phone] - Comprehensive Audio Eval & Speech Recognition    [facility] No Charge; Qty: 1 (counseling)    Visit Time: 60 min    Access your Audiogram via MyChart:  - Log into myChart: Menu > Document Center > Medical Record Requests  - Click the hyperlink 'Request Medical Records'   - Fill out fields + Select Audiograms option under 'Specific Diagnostic Images' > Submit

## 2023-02-23 NOTE — Unmapped (Signed)
[facility] Refill Coordination Note    Specialty Lite Medication(s) to be Shipped:   Mounjaro 12.5mg /0.20ml    Other medication(s) to be shipped:  accu-chek test strips, diclofenac sodium 1.5% drops, Flonase     Darlene Lucas, DOB: 1970/12/12  Phone: [phone] (home)       All above HIPAA information was verified with patient.     Was a Nurse, learning disability used for this call? No    Changes to medications: Darlene Lucas reports no changes at this time.  Changes to insurance: No      REFERRAL TO PHARMACIST     Referral to the pharmacist: Not needed      [facility]     Shipping address confirmed in Epic.     Delivery Scheduled: Yes, Expected medication delivery date: 02/28/23.     Medication will be delivered via Same Day Courier to the prescription address in Epic WAM.    Darlene Lucas [facility]

## 2023-02-25 ENCOUNTER — Ambulatory Visit: Admit: 2023-02-25 | Discharge: 2023-02-26 | Payer: BLUE CROSS/BLUE SHIELD | Attending: Family | Primary: Family

## 2023-02-25 DIAGNOSIS — M1990 Unspecified osteoarthritis, unspecified site: Principal | ICD-10-CM

## 2023-02-25 DIAGNOSIS — I10 Essential (primary) hypertension: Principal | ICD-10-CM

## 2023-02-25 DIAGNOSIS — J4520 Mild intermittent asthma, uncomplicated: Principal | ICD-10-CM

## 2023-02-25 DIAGNOSIS — E7800 Pure hypercholesterolemia, unspecified: Principal | ICD-10-CM

## 2023-02-25 LAB — LIPID PANEL
CHOLESTEROL/HDL RATIO SCREEN: 2.9 (ref 1.0–4.5)
CHOLESTEROL: 240 mg/dL — ABNORMAL HIGH (ref <=200)
HDL CHOLESTEROL: 82 mg/dL — ABNORMAL HIGH (ref 40–60)
LDL CHOLESTEROL CALCULATED: 145 mg/dL — ABNORMAL HIGH (ref 40–99)
NON-HDL CHOLESTEROL: 158 mg/dL — ABNORMAL HIGH (ref 70–130)
TRIGLYCERIDES: 63 mg/dL (ref 0–150)
VLDL CHOLESTEROL CAL: 12.6 mg/dL (ref 11–40)

## 2023-02-25 LAB — COMPREHENSIVE METABOLIC PANEL
ALBUMIN: 4.3 g/dL (ref 3.4–5.0)
ALKALINE PHOSPHATASE: 64 U/L (ref 46–116)
ALT (SGPT): 32 U/L (ref 10–49)
ANION GAP: 13 mmol/L (ref 5–14)
AST (SGOT): 24 U/L (ref <=34)
BILIRUBIN TOTAL: 1.4 mg/dL — ABNORMAL HIGH (ref 0.3–1.2)
BLOOD UREA NITROGEN: 25 mg/dL — ABNORMAL HIGH (ref 9–23)
BUN / CREAT RATIO: 29
CALCIUM: 9.8 mg/dL (ref 8.7–10.4)
CHLORIDE: 102 mmol/L (ref 98–107)
CO2: 23 mmol/L (ref 20.0–31.0)
CREATININE: 0.85 mg/dL (ref 0.55–1.02)
EGFR CKD-EPI (2021) FEMALE: 82 mL/min/{1.73_m2} (ref >=60)
GLUCOSE RANDOM: 107 mg/dL — ABNORMAL HIGH (ref 70–99)
POTASSIUM: 4.5 mmol/L (ref 3.4–4.8)
PROTEIN TOTAL: 7.8 g/dL (ref 5.7–8.2)
SODIUM: 138 mmol/L (ref 135–145)

## 2023-02-25 LAB — TSH: THYROID STIMULATING HORMONE: 2.769 u[IU]/mL (ref 0.550–4.780)

## 2023-02-25 MED ORDER — PREDNISONE 10 MG TABLET
ORAL_TABLET | 0 refills | 0 days | Status: CP
Start: 2023-02-25 — End: ?
  Filled 2023-03-02: qty 21, 9d supply, fill #0

## 2023-02-25 NOTE — Unmapped (Signed)
[facility] Note:  Darlene Lucas, [place] [address]. Phone [phone]    02/27/2023    Patient Name:   Darlene Lucas    MRN: [id number]    Demographics:    Age-  52 y.o.     Date of Birth-  03-15-1971    Chief complaint (CC):    Chief Complaint   Patient presents with    Follow-up and Medical Management     States she's still has a lot of congestion       Assessment/Plan:    Darlene Lucas is a pleasant 52 y.o. female with PMHx of GERD, Goiter, hypothyroidism, allergic rhinitis, HTN, PE, ulnar neuropathy, primary osteoarthritis of both knees, cervical neoplasia grade 2, prediabetes, HLD, obesity, chronic pain syndrome. Here for medication management and C/O nasal congestion, recommendations as below.     Diagnosis ICD-10-CM Associated Orders/Plan   1. Essential hypertension  I10 BP well controlled. Advised patient continue spironolactone 100 mg bid and pursuit of weight reduction. DASH diet, regular physical activity have been encouraged. Follow up in 4 months for BP recheck, sooner prn.     Comprehensive Metabolic Panel     Tdap vaccine greater than or equal to 7yo IM      2. Acquired hypothyroidism  E03.9 Hx of hypothyroidism, TSH wnl 04/22/22. Encouraged patient to continue levothyroxine 50 mcg daily, refills have been provided. Will plan to repeat lab testing in 3-6 months, sooner prn. Will obtain repeat TSH today prior to scheduled Endocrinology appointment 08/24/22 per patient request, plan to follow up regarding results once I receive these.     TSH      3. Prediabetes  R73.03 Hx of prediabetes, glucose wnl at 85 04/22/22 (A1c low, 4.6%). Patient denies any fatigue, GI changes, or side effects to current medication regimen. Encouraged continued compliance with Mounjaro 10 mg weekly, metformin 2000 mg daily, low-sugar diet. Will plan to repeat glucose testing today and follow up regarding results once I receive these.     Hemoglobin A1c     Comprehensive Metabolic Panel     Tdap vaccine greater than or equal to 7yo IM      4. Elevated LFTs  R79.89 Distant Hx of elevated LFTs, consistently well controlled since 08/2020 per review of EMR. Will recheck via CMP today for continuity of care, plan to follow up regarding results once I receive these. Encouraged continued lifestyle modifications for management including weight reduction, heart-healthy diet, regular physical activity.    Comprehensive Metabolic Panel      5. Chronic pain syndrome  G89.4 Ongoing, following with PM&R (Dr. Riccardo Dubin) for bilateral SI joint injections. Maintained on diclofenac sodium (40 drop, QID prn) and Celebrex 200 mg bid, no pharmacotherapy adjustments made today. Will continue to monitor and assist with care optimization prn.     Comprehensive Metabolic Panel      6. Elevated cholesterol  E78.00 Hx of mixed HLD, significant improvement in triglycerides (71), total cholesterol (160), and LDL cholesterol (86) noted 04/22/22. Patient continues dedicated pursuit of lifestyle modifications for management, in conjunction with pravastatin 40 mg nightly, and requests repeat fasting lab testing be drawn for her today, relevant orders placed. No pharmacotherapy changes made, congratulated patient on her weight loss progress and encouraged continued follow up with Weight Management. Plan to follow up regarding lab results once I receive these.     Lipid Panel      7. Need for Tdap vaccination  Z23 Tdap vaccine administered, patient tolerated well.  Tdap vaccine greater than or equal to 7yo IM      8. Allergic rhinitis with asthma without status asthmaticus, mild intermittent, uncomplicated  J45.20 X3-4 days of persistent nasal congestion per patient, which she attributes to Hx of seasonal allergies. PE notable for mild nasal congestion, otherwise unremarkable. Reassurance provided exam not consistent with developing secondary infection, advised trial of Singulair 10 mg nightly for management. Potential benefits vs risks were reviewed, Rx sent to patient's preferred pharmacy. Continue Flonase 1 spray in each nostril daily. Symptomatic management reviewed, good hydration with water encouraged. ER precautions discussed, follow up if symptoms fail to improve or worsen.    montelukast (SINGULAIR) 10 mg tablet  Prednisone taper        45 minutes of clinical time > 1/2 the office visit was face to face time. We discussed medical, dietary, lifestyle, and health maintenance modifications to optimize health. Standard precautions followed during visit. Medication adherence and barriers to the treatment plan have been addressed.  Patient voiced understanding, needs F/U visit in 6 months, sooner prn.    Health Maintenance:   Health Maintenance Due   Topic Date Due    Urine Albumin/Creatinine Ratio  Never done    COVID-19 Vaccine (3 - 2024-25 season) 11/21/2022    Pneumococcal Vaccine 0-64 (2 of 2 - PCV) 12/04/2022    Hemoglobin A1c  02/22/2023     PHQ-2 Score:      PHQ-9 Score:      Edinburgh Score:      Screening complete, no depression identified / no further action needed today    Subjective:    History of present illness (HPI):       Darlene Lucas is a 52 y.o. female who presented to [facility] for medication management. Generally doing well, patient does however report x3-4 days of persistent nasal congestion which she attributes to Hx of seasonal allergies, endorses concern for developing secondary sinusitis. Patient's medical history was reviewed, see Past Medical History. Medications used in the past were reviewed, see medications. Notes x1 month of inability to fulfill Mounjaro Rx (managed by [facility]) due to supply shortages, has since been able to obtain refill and resume use. Patient reports eating and eliminating well. Pt is sleeping well. Patient has not required emergency room treatment for these symptoms, and has not required hospitalization.     Denies HA, fever, chest pain, shortness of breath, N/V/D, bowel or bladder issues, vision or hearing changes, or swelling.     Relevant ROS: Reviewed 12 systems, positive findings listed, all others negative.    Pertinent Past Med Hx:    Past Medical History:   Diagnosis Date    Abnormal Pap smear of cervix     Abnormal uterine bleeding     Was told possible adenomyosis, possible need for hysterectomy by Dr. Alvino Chapel. Had [place], and trial of 90 days of provera. 11/29/13 TVUS 5x6x4cm RO 2x1.5x1.5 LO 2x2x 1.6, EMS 5mm, abundant flow near endometrium c/w possible adenomyosis     Allergic Age 15    Amblyopia 1974    Anemia     Arthritis     Asthma     Blepharitis November 2021    BMI 50.0-59.9, adult (CMS-HCC)     Chronic back pain     Constipation     Diabetes mellitus (CMS-HCC) 2020    Difficult intravenous access     Dry eyes     Dysplasia of cervix, high grade CIN 2  Edema     Elevated LFTs     Environmental allergies     Eye trauma 2020    fractured orbit. Unsure which eye.    GERD (gastroesophageal reflux disease)     Goiter 2015    monitoring levels    Headache     Hyperlipidemia     Hypertension 2021    Hypokalemia     Prediabetes     Pulmonary embolism (CMS-HCC) 1989, 1992    following miscarriages     Recurrent UTI     Right leg pain 10/2017    right fibula fracture    Thyroid function study abnormality 2015    seeing endocrine (TSH 5.37 12/25/13, T4 1.02       Medications:       Current Outpatient Medications:     albuterol HFA 90 mcg/actuation inhaler, Inhale 2 puffs every six (6) hours as needed for wheezing., Disp: 54 g, Rfl: 1    aspirin 81 MG chewable tablet, Chew 1 tablet (81 mg total) daily., Disp: 30 tablet, Rfl: 0    blood sugar diagnostic (ACCU-CHEK GUIDE TEST STRIPS) Strp, Use to check blood sugar once daily as directed, Disp: 100 strip, Rfl: 3    calcium citrate-vitamin D3 (CITRACAL + D MAXIMUM) 315 mg-6.25 mcg (250 unit) per tablet, Take 1 tablet by mouth Two (2) times a day., Disp: 60 tablet, Rfl: 1    celecoxib (CELEBREX) 200 MG capsule, Take 1 capsule (200 mg total) by mouth Two (2) times a day. May take 1 additional dose as needed for acute pain., Disp: 90 capsule, Rfl: 3    celecoxib (CELEBREX) 200 MG capsule, Take 1 capsule (200 mg total) by mouth two (2) times a day., Disp: 180 capsule, Rfl: 3    chlorhexidine (PERIDEX) 0.12 % solution, RINSE WITH CAPFUL ONCE DAILY THEN SPIT. DO NOT RINSE, EAT, OR DRINK 30 MINUTES AFTER USE, Disp: , Rfl:     conjugated estrogens (PREMARIN) 0.625 mg/gram vaginal cream, Insert 0.5 g into the vagina daily., Disp: 30 g, Rfl: 11    diclofenac sodium 1.5 % Drop, Apply 40 drops topically four (4) times a day as needed for pain, Disp: 150 mL, Rfl: 2    eflornithine (VANIQA) 13.9 % cream, Apply at night as directed (Patient not taking: Reported on 11/05/2022), Disp: 45 g, Rfl: 0    estrogens, conjugated, (PREMARIN) 0.3 MG tablet, Take 1 tablet (0.3 mg total) by mouth daily., Disp: 90 tablet, Rfl: 1    finasteride (PROPECIA) 1 mg tablet, Take 1 tablet (1 mg total) by mouth daily., Disp: 90 tablet, Rfl: 0    lancets (ACCU-CHEK SOFTCLIX LANCETS) Misc, Use to check blood sugar once daily as directed, Disp: 100 each, Rfl: 3    levothyroxine (SYNTHROID) 50 MCG tablet, Take 1 tablet (50 mcg total) by mouth daily., Disp: 90 tablet, Rfl: 1    metFORMIN (GLUCOPHAGE-XR) 500 MG 24 hr tablet, Take 2 tablets (1,000 mg total) by mouth daily with evening meal., Disp: 120 tablet, Rfl: 5    minoxidil (LONITEN) 2.5 MG tablet, Take 1/2 tablet (1.25 mg total) by mouth daily., Disp: 45 tablet, Rfl: 0    montelukast (SINGULAIR) 10 mg tablet, Take 1 tablet (10 mg total) by mouth daily., Disp: 90 tablet, Rfl: 3    pantoprazole (PROTONIX) 40 MG tablet, Take 1 tablet (40 mg total) by mouth two (2) times a day. (Patient not taking: Reported on 02/09/2023), Disp: 180 tablet, Rfl: 1    pravastatin (PRAVACHOL)  40 MG tablet, Take 1 tablet (40 mg total) by mouth nightly., Disp: 90 tablet, Rfl: 1    predniSONE (DELTASONE) 10 MG tablet, 2 tablets twice a day for 3 days then 1 tablet twice day for 3 days then 1 tablet per day for 3 day., Disp: 21 tablet, Rfl: 0    spironolactone (ALDACTONE) 100 MG tablet, Take 1 tablet (100 mg total) by mouth two (2) times a day., Disp: 180 tablet, Rfl: 0    tirzepatide (MOUNJARO) 10 mg/0.5 mL PnIj, Inject 10 mg under the skin every seven (7) days., Disp: 2 mL, Rfl: 3    tirzepatide (MOUNJARO) 12.5 mg/0.5 mL PnIj, Inject the contents of 1 pen (12.5 mg) under the skin once a week., Disp: 2 mL, Rfl: 6    tretinoin (RETIN-A) 0.05 % cream, Apply a pea sized amount at night 2-3 times a week x 2 weeks, then every other night for 2 weeks, then nightly. If getting dry, use a facial moisturizer in the morning such as Oil of Olay complete sensitive skin or CereVe AM, Disp: 45 g, Rfl: 4     Allergies:   Allergies   Allergen Reactions    Iohexol Anaphylaxis      Desc: IVP DYE   OK WITH 13 HR PREP    Propoxyphene Hives     Uncoded Allergy. Allergen: SHELLFISH    Shellfish Containing Products Anaphylaxis     Patient is ok with topical betadine.    Sulfa (Sulfonamide Antibiotics) Hives    Topiramate Other (See Comments)     Other Reaction: OTHER REACTION  Other Reaction: OTHER REACTION  Increase in HR     Pertinent Social Hx and Habits: EMR reviewed    Pertinent Family Hx: EMR reviewed    Objective:     BP Readings from Last 3 Encounters:   02/25/23 112/78   01/25/23 125/86   10/21/22 110/79        Vitals:    02/25/23 1019   BP: 112/78   BP Site: R Arm   BP Position: Sitting   BP Cuff Size: Medium   Pulse: 85   SpO2: 99%   Weight: 66.5 kg (146 lb 8 oz)   Height: 154.9 cm (5' 1)        Physical Exam:    General Appearance: WDWN in NAD      Skin: W, D, I  HEENT: PERRLA, EOMI  Respiratory: Clear throughout  Cardio: RRR  Neurologic: A & O X 4, Grossly intact, stable gait  PSYCH: Behavior calm and cooperative    Diagnostics:     Lab Results   Component Value Date    WBC 8.2 04/22/2022    HGB 13.4 04/22/2022    HCT 38.1 04/22/2022    PLT 294 04/22/2022       Lab Results   Component Value Date    NA 138 02/25/2023 K 4.5 02/25/2023    CL 102 02/25/2023    CO2 23.0 02/25/2023    BUN 25 (H) 02/25/2023    CREATININE 0.85 02/25/2023    GLU 107 (H) 02/25/2023    CALCIUM 9.8 02/25/2023       Lab Results   Component Value Date    BILITOT 1.4 (H) 02/25/2023    PROT 7.8 02/25/2023    ALBUMIN 4.3 02/25/2023    ALT 32 02/25/2023    AST 24 02/25/2023    ALKPHOS 64 02/25/2023       No results found for: PT, INR,  APTT     TSH   Date Value Ref Range Status   02/25/2023 2.769 0.550 - 4.780 uIU/mL Final   08/23/2022 1.817 0.550 - 4.780 uIU/mL Final           Olena Leatherwood, NP    Dr. Betha Loa, DNP, FNP-BC  [facility] Certified Doctor of Nursing Practice   [phone]

## 2023-03-02 MED FILL — MOUNJARO 12.5 MG/0.5 ML SUBCUTANEOUS PEN INJECTOR: SUBCUTANEOUS | 28 days supply | Qty: 2 | Fill #2

## 2023-03-02 MED FILL — ACCU-CHEK GUIDE TEST STRIPS: 34 days supply | Qty: 50 | Fill #5

## 2023-03-02 MED FILL — DICLOFENAC 1.5 % TOPICAL DROPS: TOPICAL | 7 days supply | Qty: 150 | Fill #1

## 2023-03-02 NOTE — Unmapped (Signed)
Darlene Lucas requested a refill of their Mounjaro via IVR/Web. [facility] has scheduled delivery per the patients request via Same Day Courier to be delivered to their prescription address on 12/11-24.

## 2023-03-04 ENCOUNTER — Telehealth: Admit: 2023-03-04 | Discharge: 2023-03-05 | Payer: BLUE CROSS/BLUE SHIELD | Attending: Family | Primary: Family

## 2023-03-04 DIAGNOSIS — H109 Unspecified conjunctivitis: Principal | ICD-10-CM

## 2023-03-04 MED ORDER — POLYMYXIN B SULFATE 10,000 UNIT-TRIMETHOPRIM 1 MG/ML EYE DROPS
OPHTHALMIC | 0 refills | 34.00 days | Status: CP
Start: 2023-03-04 — End: 2023-03-11

## 2023-03-04 NOTE — Unmapped (Signed)
TeleHealth Phone Encounter  This medical encounter was conducted virtually using [email or web address]  TeleHealth Protocols.    I have identified myself to the patient and conveyed my credentials to [name]  I have explained the capabilities and limitations of telemedicine and the patient and myself both agree that it is appropriate for their current circumstances/symptoms.   In case we get disconnected, patient's phone number is [phone]   Is there someone else in the room? No.   I have informed patient that medical scribe is present during this telephone encounter.     Assessment and Plan:      Diagnosis ICD-10-CM Associated Orders   1. Conjunctivitis of both eyes, unspecified conjunctivitis type  H10.9 polymyxin B sulf-trimethoprim (POLYTRIM) 10,000 unit- 1 mg/mL Drop        Counseled patient on the different types of conjunctivitis. Discussed that since she was exposed to pink eye again after her original infection that might be why the infection returned. Recommended a repeat of antibiotic eye drops. Discussed that if symptoms do not improve the conjunctivitis may not be bacterial. Trial Polytrim 10,000 unit- 1 mg/mL 1 drop in each eye every 4 hours x 7 day. If symptoms worsen or persist contact this provider.    Return if symptoms worsen or fail to improve.    Total time spent with patient: 30 minutes     The patient reports they are physically located in [place] and is currently: at home. I conducted a audio/video visit. I spent 25 minutes on the video call with the patient. I spent an additional 5 minutes on pre- and post-visit activities on the date of service .     HPI:      Darlene Lucas is here for No chief complaint on file.    Patient presents today with concern of pink eye. She states that she was diagnosed with pink eye in both eyes and treated with ofloxacin 01/25/23. Currently eyes are crusted, erythematous, and draining. She says that she had eye drops that did not clear the infection and she has now ran out of the drops. She had pink eye at first that improved most of the way and then came back worse(she was symptom free x 1 week). Endorses contact with people with pink eye. Patient notes having strep throat and a sinus infection recently.          ROS:      Comprehensive 10 point ROS negative unless otherwise stated in the HPI.      PCMH Components:     Medication adherence and barriers to the treatment plan have been addressed. Opportunities to optimize healthy behaviors have been discussed. Patient / caregiver voiced understanding.      Past Medical/Surgical History:     Past Medical History:   Diagnosis Date    Abnormal Pap smear of cervix     Abnormal uterine bleeding     Was told possible adenomyosis, possible need for hysterectomy by Dr. Alvino Chapel. Had [place], and trial of 90 days of provera. 11/29/13 TVUS 5x6x4cm RO 2x1.5x1.5 LO 2x2x 1.6, EMS 5mm, abundant flow near endometrium c/w possible adenomyosis     Allergic Age 18    Amblyopia 1974    Anemia     Arthritis     Asthma     Blepharitis November 2021    BMI 50.0-59.9, adult (CMS-HCC)     Chronic back pain     Constipation     Diabetes mellitus (CMS-HCC) 2020  Difficult intravenous access     Dry eyes     Dysplasia of cervix, high grade CIN 2     Edema     Elevated LFTs     Environmental allergies     Eye trauma 2020    fractured orbit. Unsure which eye.    GERD (gastroesophageal reflux disease)     Goiter 2015    monitoring levels    Headache     Hyperlipidemia     Hypertension 2021    Hypokalemia     Prediabetes     Pulmonary embolism (CMS-HCC) 1989, 1992    following miscarriages     Recurrent UTI     Right leg pain 10/2017    right fibula fracture    Thyroid function study abnormality 2015    seeing endocrine (TSH 5.37 12/25/13, T4 1.02     Past Surgical History:   Procedure Laterality Date    ENDOMETRIAL BIOPSY      11/2013    HYSTERECTOMY      Knee arthroscopy Left 1987    LUMBAR FUSION      L5 - S1 - multiple - also had hardware removed LUMBAR FUSION  2008    L1-S1    PR ANKLE SCOPE,EXTENS DEBRIDEMNT Right 08/30/2018    Procedure: ARTHROSCOPY ANK SURG; DEBRID EXTEN - modifier 22;  Surgeon: Valda Favia, MD;  Location: [facility];  Service: Orthopedics    PR ARTHROCENTESIS ASPIR&/INJ MAJOR JT/BURSA W/O [place] Right 08/30/2018    Procedure: ARTHROCENTESIS, ASPIRATION AND/OR INJECTION; MAJOR JOINT OR BURSA (E.G.SHOULDER/HIP/KNEE);  Surgeon: Valda Favia, MD;  Location: [facility]  Service: Orthopedics    PR BIOPSY OF VAGINA,EXTENSIVE N/A 09/20/2014    Procedure: BIOPSY VAGINAL MUCOSA; EXTENSIVE, REQUIRING SUTURE (INCLUDING CYSTS);  Surgeon: Hollice Gong, MD;  Location: [facility] OR [facility];  Service: Advanced Laparoscopy    PR BIOPSY/EXCISION, LYMPH NODE(S) N/A 09/20/2014    Procedure: BX/EXC LYMPH NODE; OPEN, SUPERF (SEPART PROC);  Surgeon: Hollice Gong, MD;  [facility];  Service: Advanced Laparoscopy    PR CLOSED TREATMENT PST MALLEOLUS FRACTURE W/O MANIP Right 11/10/2017    Procedure: CLOSED TREATMENT OF POSTERIOR MALLEOLUS FRACTURE; WITHOUT MANIPULATION;  Surgeon: Valda Favia, MD;  Location: [facility] OR [facility];  Service: Ortho Foot & Ankle    PR COLPOSCOPY,ENTIRE VAGINA,W/BIOPSY(S) N/A 09/20/2014    Procedure: COLPOSCOPY OF THE ENTIRE VAGINA, WITH CERVIX IF PRESENT; WITH BIOPSY(S) OF VAGINA/CERVIX;  Surgeon: Hollice Gong, MD;  Location: [facility] OR [facility];  Service: Advanced Laparoscopy    PR COLSC FLX W/RMVL OF TUMOR POLYP LESION SNARE TQ N/A 12/20/2019    Procedure: COLONOSCOPY FLEX; W/REMOV TUMOR/LES BY SNARE;  Surgeon: Jules Husbands, MD;  Location: [facility];  Service: Gastroenterology    PR CYSTOURETHROSCOPY N/A 06/13/2014    Procedure: CYSTOURETHROSCOPY (SEPARATE PROCEDURE);  Surgeon: Hollice Gong, MD;  Location: [facility] OR [facility];  Service: Advanced Laparoscopy    PR KNEE SCOPE,MED OR LAT MENIS REPAIR Right 09/15/2021    Procedure: ARTHROSCOPY, KNEE, SURGICAL; WITH MENISCUS REPAIR (MEDIAL OR LATERAL);  Surgeon: Nickola Major, MD;  Location: OR [facility];  Service: Orthopedics    PR LAP,VAG HYST,UTERUS 250GMS/<,SALP-OOPH N/A 06/13/2014    Procedure: ROBOTIC LAPAROSCOPY, SURGICAL, W/VAGINAL HYSTERECTOMY, UTERUS 250 GRAMS OR LESS; W/REMOVAL OF TUBE(S) &/OR OVARY(S);  Surgeon: Hollice Gong, MD;  Location: [facility] OR [facility];  Service: Advanced Laparoscopy    PR OPEN TREATMENT PROXIMAL FIBULA/SHAFT FRACTURE Right 11/10/2017  Procedure: OPEN TREATMENT OF PROXIMAL FIBULA OR SHAFT FRACTURE, INCLUDES INTERNAL FIXATION, WHEN PERFORMED - Modifier 22;  Surgeon: Valda Favia, MD;  Location: [facility] OR [facility];  Service: Ortho Foot & Ankle    PR OPEN TX DISTAL TIBIOFIBULAR JOINT DISRUPTION Right 11/10/2017    Procedure: OPEN TREATMENT OF DISTAL TIBIOFIBULAR JOINT (SYNDESMOSIS) DISRUPTION, INCLUDES INTERNAL FIXATION, IF DONE;  Surgeon: Valda Favia, MD;  Location: [facility] OR [facility];  Service: Ortho Foot & Ankle    PR PELVIC EXAMINATION W ANESTH N/A 09/20/2014    Procedure: PELVIC EXAMINATION UNDER ANESTHESIA (OTHER THAN LOCAL);  Surgeon: Hollice Gong, MD;  Location: [facility] OR [facility];  Service: Advanced Laparoscopy    PR REMOVAL IMPLANT DEEP Right 08/30/2018    Procedure: R20--REMOVE IMPLANT; DEEP--RIGHT ANKLE x 2 - modifier 22;  Surgeon: Valda Favia, MD;  Location: [facility] OR [facility];  Service: Orthopedics    PR REPAIR 1 COLLAT ANKLE LIGMNT,PRIMARY Right 11/10/2017    Procedure: REPAIR, PRIMARY, DISRUPTED LIGAMENT, ANKLE; COLLATERAL;  Surgeon: Valda Favia, MD;  Location: [facility] OR [facility];  Service: Ortho Foot & Ankle    PR UPPER GI ENDOSCOPY,BIOPSY N/A 12/20/2019    Procedure: UGI ENDOSCOPY; WITH BIOPSY, SINGLE OR MULTIPLE;  Surgeon: Jules Husbands, MD;  Location: [facility];  Service: Gastroenterology    SI joint surgery  2004    Bilateral     TONSILLECTOMY      TUBAL LIGATION         Family History:     Family History   Problem Relation Age of Onset    Pulmonary embolism Mother     Heart attack Mother     Stroke Mother     Allergies Father     Cancer Sister         cervical cancer    No Known Problems Daughter     No Known Problems Daughter     Diabetes Paternal Grandfather     Diabetes Paternal Grandmother     Diabetes Sister     Thyroid disease Sister     Clotting disorder Neg Hx     Anesthesia problems Neg Hx     Bleeding Disorder Neg Hx     Melanoma Neg Hx     Basal cell carcinoma Neg Hx     Squamous cell carcinoma Neg Hx     Breast cancer Neg Hx     Glaucoma Neg Hx     Macular degeneration Neg Hx        Social History:     Social History     Tobacco Use    Smoking status: Never    Smokeless tobacco: Never   Vaping Use    Vaping status: Never Used   Substance Use Topics    Alcohol use: Not Currently     Alcohol/week: 0.0 standard drinks of alcohol     Comment: rare    Drug use: Never       Allergies:     Iohexol, Propoxyphene, Shellfish containing products, Sulfa (sulfonamide antibiotics), and Topiramate    Current Medications:     Current Outpatient Medications   Medication Sig Dispense Refill    albuterol HFA 90 mcg/actuation inhaler Inhale 2 puffs every six (6) hours as needed for wheezing. 54 g 1    aspirin 81 MG chewable tablet Chew 1 tablet (81 mg total) daily. 30 tablet 0    blood sugar diagnostic (ACCU-CHEK GUIDE TEST STRIPS) Strp Use to check blood sugar  once daily as directed 100 strip 3    calcium citrate-vitamin D3 (CITRACAL + D MAXIMUM) 315 mg-6.25 mcg (250 unit) per tablet Take 1 tablet by mouth Two (2) times a day. 60 tablet 1    celecoxib (CELEBREX) 200 MG capsule Take 1 capsule (200 mg total) by mouth Two (2) times a day. May take 1 additional dose as needed for acute pain. 90 capsule 3    celecoxib (CELEBREX) 200 MG capsule Take 1 capsule (200 mg total) by mouth two (2) times a day. 180 capsule 3    chlorhexidine (PERIDEX) 0.12 % solution RINSE WITH CAPFUL ONCE DAILY THEN SPIT. DO NOT RINSE, EAT, OR DRINK 30 MINUTES AFTER USE conjugated estrogens (PREMARIN) 0.625 mg/gram vaginal cream Insert 0.5 g into the vagina daily. 30 g 11    diclofenac sodium 1.5 % Drop Apply 40 drops topically four (4) times a day as needed for pain 150 mL 2    eflornithine (VANIQA) 13.9 % cream Apply at night as directed (Patient not taking: Reported on 11/05/2022) 45 g 0    estrogens, conjugated, (PREMARIN) 0.3 MG tablet Take 1 tablet (0.3 mg total) by mouth daily. 90 tablet 1    finasteride (PROPECIA) 1 mg tablet Take 1 tablet (1 mg total) by mouth daily. 90 tablet 0    lancets (ACCU-CHEK SOFTCLIX LANCETS) Misc Use to check blood sugar once daily as directed 100 each 3    levothyroxine (SYNTHROID) 50 MCG tablet Take 1 tablet (50 mcg total) by mouth daily. 90 tablet 1    metFORMIN (GLUCOPHAGE-XR) 500 MG 24 hr tablet Take 2 tablets (1,000 mg total) by mouth daily with evening meal. 120 tablet 5    minoxidil (LONITEN) 2.5 MG tablet Take 1/2 tablet (1.25 mg total) by mouth daily. 45 tablet 0    montelukast (SINGULAIR) 10 mg tablet Take 1 tablet (10 mg total) by mouth daily. 90 tablet 3    pantoprazole (PROTONIX) 40 MG tablet Take 1 tablet (40 mg total) by mouth two (2) times a day. (Patient not taking: Reported on 02/09/2023) 180 tablet 1    polymyxin B sulf-trimethoprim (POLYTRIM) 10,000 unit- 1 mg/mL Drop Administer 1 drop to both eyes every four (4) hours for 7 days. 10 mL 0    pravastatin (PRAVACHOL) 40 MG tablet Take 1 tablet (40 mg total) by mouth nightly. 90 tablet 1    predniSONE (DELTASONE) 10 MG tablet 2 tablets twice a day for 3 days then 1 tablet twice day for 3 days then 1 tablet per day for 3 day. 21 tablet 0    spironolactone (ALDACTONE) 100 MG tablet Take 1 tablet (100 mg total) by mouth two (2) times a day. 180 tablet 0    tirzepatide (MOUNJARO) 10 mg/0.5 mL PnIj Inject 10 mg under the skin every seven (7) days. 2 mL 3    tirzepatide (MOUNJARO) 12.5 mg/0.5 mL PnIj Inject the contents of 1 pen (12.5 mg) under the skin once a week. 2 mL 6 tretinoin (RETIN-A) 0.05 % cream Apply a pea sized amount at night 2-3 times a week x 2 weeks, then every other night for 2 weeks, then nightly. If getting dry, use a facial moisturizer in the morning such as Oil of Olay complete sensitive skin or CereVe AM 45 g 4     No current facility-administered medications for this visit.       Health Maintenance:     Health Maintenance   Topic Date Due    Urine  Albumin/Creatinine Ratio  Never done    COVID-19 Vaccine (3 - 2024-25 season) 11/21/2022    Pneumococcal Vaccine 0-64 (2 of 2 - PCV) 12/04/2022    Hemoglobin A1c  02/22/2023    Retinal Eye Exam  01/18/2024    Foot Exam  02/25/2024    Serum Creatinine Monitoring  02/25/2024    Potassium Monitoring  02/25/2024    Mammogram  04/22/2024    Colon Cancer Screening  12/19/2029    DTaP/Tdap/Td Vaccines (3 - Td or Tdap) 08/22/2032    Hepatitis C Screen  Completed    Influenza Vaccine  Completed    Zoster Vaccines  Completed       Immunizations:     Immunization History   Administered Date(s) Administered    COVID-19 VAC,MRNA,TRIS(12Y UP)(PFIZER)(GRAY CAP) 11/14/2019, 12/04/2019    INFLUENZA INJ MDCK PF, QUAD,(FLUCELVAX)(29MO AND UP EGG FREE) 05/29/2020, 01/30/2021    INFLUENZA VACCINE IIV3(IM)(PF)6 MOS UP 12/24/2022    Influenza Vaccine Quad(IM)6 MO-Adult(PF) 12/24/2013, 12/30/2015, 01/13/2017, 01/09/2019, 02/04/2020, 12/03/2021    Influenza Virus Vaccine, unspecified formulation 03/31/2012, 01/09/2019    PNEUMOCOCCAL POLYSACCHARIDE 23-VALENT 03/31/2012, 12/03/2021    SHINGRIX-ZOSTER VACCINE (HZV),RECOMBINANT,ADJUVANTED(IM) 07/09/2019, 09/10/2019    TdaP 09/14/2013, 08/23/2022     I have reviewed and (if needed) updated the patient's problem list, medications, allergies, past medical and surgical history, social and family history.     Vital Signs:     Wt Readings from Last 3 Encounters:   02/25/23 66.5 kg (146 lb 8 oz)   01/25/23 66.7 kg (147 lb)   11/05/22 67.1 kg (147 lb 14.9 oz)     Temp Readings from Last 3 Encounters: 02/09/23 (!) 38.1 ??C (100.5 ??F)   01/25/23 36.7 ??C (98.1 ??F) (Oral)   03/02/22 35.9 ??C (96.7 ??F) (Temporal)     BP Readings from Last 3 Encounters:   02/25/23 112/78   01/25/23 125/86   10/21/22 110/79     Pulse Readings from Last 3 Encounters:   02/25/23 85   01/25/23 102   10/21/22 89     Estimated body mass index is 27.68 kg/m?? as calculated from the following:    Height as of 02/25/23: 154.9 cm (5' 1).    Weight as of 02/25/23: 66.5 kg (146 lb 8 oz).  No height and weight on file for this encounter.        Objective:      GEN: No acute distress.   Eyes: Mild injection of conjunctiva bilateral. No visible eye discharge.   SKIN: Color is normal. No rashes.  PSYCH: Appropriate affect, normal mood  RESP: Normal work of breathing, no retractions         I attest that I, Arta Bruce, personally documented this note while acting as scribe for Noralyn Pick, FNP.      Arta Bruce, Scribe.  03/04/2023     The documentation recorded by the scribe accurately reflects the service I personally performed and the decisions made by me.    Noralyn Pick, FNP

## 2023-03-10 ENCOUNTER — Ambulatory Visit
Admit: 2023-03-10 | Discharge: 2023-03-11 | Payer: BLUE CROSS/BLUE SHIELD | Attending: Student in an Organized Health Care Education/Training Program | Primary: Student in an Organized Health Care Education/Training Program

## 2023-03-10 DIAGNOSIS — D492 Neoplasm of unspecified behavior of bone, soft tissue, and skin: Principal | ICD-10-CM

## 2023-03-10 DIAGNOSIS — L659 Nonscarring hair loss, unspecified: Principal | ICD-10-CM

## 2023-03-10 DIAGNOSIS — L719 Rosacea, unspecified: Principal | ICD-10-CM

## 2023-03-10 DIAGNOSIS — J4520 Mild intermittent asthma, uncomplicated: Principal | ICD-10-CM

## 2023-03-10 MED ORDER — VENTOLIN HFA 90 MCG/ACTUATION AEROSOL INHALER
Freq: Four times a day (QID) | RESPIRATORY_TRACT | 1 refills | 75.00 days | Status: CP | PRN
Start: 2023-03-10 — End: 2024-03-09
  Filled 2023-03-14: qty 54, 75d supply, fill #0

## 2023-03-10 NOTE — Unmapped (Signed)
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   - Continue spironolactone to 200 mg daily (08/2022 K wnl, pt is menopausal).   - Continue oral minoxidil 1.25 mg daily. Risks: do not use in patients with CHF, valve disease, poorly controlled hypertension, pulmonary hypertension. Patient does not have a history of CHF or valve disease. She has well controlled essential hypertension (per documentation by PCP 08/2022)  - Start finasteride (PROPECIA) 1 mg tablet; Take 1 tablet (1 mg total) by mouth daily.  Patient currently abstinent and has had hysterectomy. If not approved by insurance, will plan to increase minoxidil to 2.5 mg     Rosacea, ETT  - Can consider topical medications at future appointment  - Continue to follow up with ophthalmology for evaluation for ocular rosacea    Neoplasm(ia) of unspecified etiology:  - To help confirm diagnosis, biopsy/biopsies obtained today.   Biopsy (Shave) Procedure Note:   After R/B/A discussed (including scarring, pigment alteration, recurrence, or persistence of the lesion) and consent was obtained, the area was marked and photographed. Time Out verification of patient, procedure, and site was performed. When applicable, safety precautions based on patient's medical history or medication use and review of relevant images and results was also performed as part of the Time Out. Site was then prepped with alcohol, and anesthetized with lidocaine 2% with epinephrine. Biopsy(ies) performed using a shave technique. Hemostasis was achieved with pressure, aluminum chloride, Monsel's, and/or electrocautery. Area was dressed with petrolatum and bandage. Wound care instructions were provided. We will contact the patient with results when available. Patient agrees to be notified of results by MyChart - even if needs further management.   A) Location: left nasal ala , DDx: spider angioma vs r/o BCC    Milia - not discussed today  - Benign, reassurance provided  - Reassured that lesion appears benign, presenting with no worrisome features on exam today  - Continue tretinoin 0.05% cream. Apply a pea-sized amount for the entire face on dry skin at every other night, increase to nightly as tolerated. Mix with moisturizer if too drying  - Instructed patient to contact us if prescription is cost-prohibitive so we can send the tretinoin to a compounding pharmacy   - The importance of sun protection and using OTC  broad spectrum, SPF 30 or higher sunscreen was reviewed.      The patient was advised to call for an appointment should any new, changing, or symptomatic lesions develop.     RTC: Return in about 3 months (around 06/08/2023) for follow up of alopecia. or sooner as needed   _________________________________________________________________      Chief Complaint     Chief Complaint   Patient presents with    Skin Check     Spot on nose       HPI     Darlene Lucas is a 52 y.o. female who presents as a returning patient (last seen by Lowry Bowl, PA on 12/09/2022) to Dermatology for follow up of alopecia. At last visit, patient was to continue spironolactone to 200 mg daily, continue oral minoxidil 1.25% daily, and start finasteride 1 mg daily for alopecia and was to continue tretinoin for milia.    Alopecia  - States regrowth in previously bald areas  - Notes improvement with shedding  LOC  - Family history of rosacea  - Notes a raised lesion lasting 6 + months  - Denies pain  - Endorses pruritis and draining  - Notes she has been taking steroids for asthma relief which helped decrease inflammation of the face      Rosacea  - Concerns of family history of rosacea  - Notes ocular rosacea    The patient denies any other new or changing lesions or areas of concern.     Pertinent Past Medical History     No history of skin cancer    Family History:   Negative for melanoma    Past Medical History, Family History, Social History, Medication List, Allergies, and Problem List were reviewed in the rooming section of Epic.     ROS: Other than symptoms mentioned in the HPI, no fevers, chills, or other skin complaints    Physical Examination     GENERAL: Well-appearing female in no acute distress, resting comfortably.  NEURO: Alert and oriented, answers questions appropriately  PSYCH: Normal mood and affect  SKIN: Examination of the hair, scalp, and face was performed  - Scattered telangiectasias of bilateral cheeks and nose  - mild thinning of hair diffusely mainly centered on occipital scalp and part line  Improved from prior. Negative hair pull test    Site (Biopsy A): Other-enter as order comment   Differential (Biopsy A): Neoplasia - Uncertain etiology   Neoplasia (Biopsy A): Other - See comment   Size of Lesion (Biopsy A): 3 mm Triangulation (Biopsy A): n/a   Type of biopsy (Biopsy A): Shave   Description (Biopsy A): erythematous macule with overlying telangiactasias       All areas not commented on are within normal limits or unremarkable    Scribe's Attestation: Lowry Bowl, PA-C obtained and performed the history, physical exam and medical decision making elements that were entered into the chart.  Signed by Gilmore Laroche, Scribe, on March 10, 2023 at 10:30 AM.    ----------------------------------------------------------------------------------------------------------------------  March 10, 2023 12:14 PM. Documentation assistance provided by the Scribe. I was present during the time the encounter was recorded. The information recorded by the Scribe was done at my direction and has been reviewed and validated by me.  ----------------------------------------------------------------------------------------------------------------------      (Approved Template 12/03/2019)

## 2023-03-14 MED FILL — PREMARIN 0.625 MG/GRAM VAGINAL CREAM: VAGINAL | 60 days supply | Qty: 30 | Fill #1

## 2023-03-17 DIAGNOSIS — Z1231 Encounter for screening mammogram for malignant neoplasm of breast: Principal | ICD-10-CM

## 2023-03-21 DIAGNOSIS — H109 Unspecified conjunctivitis: Principal | ICD-10-CM

## 2023-03-21 MED ORDER — POLYMYXIN B SULFATE 10,000 UNIT-TRIMETHOPRIM 1 MG/ML EYE DROPS
OPHTHALMIC | 0 refills | 34.00 days | Status: CP
Start: 2023-03-21 — End: 2023-03-28

## 2023-03-21 NOTE — Unmapped (Addendum)
Patient called requesting a refill on Polymyxin B Sulf-Trimethoprim (POLYTRIM) 10,000 unit- 1 mg/mL Drops that was originally prescribed to her on 03/04/23. She stated that the prescription worked effectively, but she found out that the Systane eyedrops that she was using over the counter for dry eyes were contaminated with fungus, and had been recalled. She stated that as a result of using the Systane, her infection has come back. Told Patient that this message along with the pended refill will be forwarded to the on-call Provider. Patient verbalized understanding.     9:46 AM     Rx sent  Valorie Roosevelt, FNP

## 2023-03-24 DIAGNOSIS — M1990 Unspecified osteoarthritis, unspecified site: Principal | ICD-10-CM

## 2023-03-24 MED ORDER — CELECOXIB 200 MG CAPSULE
ORAL_CAPSULE | Freq: Two times a day (BID) | ORAL | 3 refills | 90.00 days
Start: 2023-03-24 — End: 2024-03-23

## 2023-03-24 NOTE — Unmapped (Signed)
Hello Byrd Hesselbach,    Happy New Year!    I have reviewed your lab testing and it looks fine.    Of note, cholesterol, glucose, bilirubin, and BUN were slightly out of range, we will continue to monitor.     Please follow-up with a phone visit with any questions or concerns.    Recheck lab results as discussed.    Take Care,  Dr. Nira Retort

## 2023-03-25 MED ORDER — CELECOXIB 200 MG CAPSULE
ORAL_CAPSULE | Freq: Two times a day (BID) | ORAL | 3 refills | 45.00 days | Status: CP
Start: 2023-03-25 — End: 2024-03-24
  Filled 2023-04-26: qty 180, 90d supply, fill #0

## 2023-03-25 NOTE — Unmapped (Signed)
Patient is requesting the following refill  Requested Prescriptions     Pending Prescriptions Disp Refills    celecoxib (CELEBREX) 200 MG capsule 180 capsule 3     Sig: Take 1 capsule (200 mg total) by mouth two (2) times a day.       Recent Visits  Date Type Provider Dept   03/04/23 Telemedicine Loran Senters, FNP [facility] [address] [facility]   02/25/23 Office Visit Nile Dear, Emogene Morgan, NP [place] [facility] [address] [facility]   02/09/23 Telemedicine Farrug, Emogene Morgan, NP [place] [facility] [address] At [facility]   08/23/22 Office Visit Nile Dear, Emogene Morgan, NP [place] [facility] [address] [facility]   04/22/22 Office Visit Nile Dear, Emogene Morgan, NP [place] [facility] [address] [facility]   Showing recent visits within past 365 days and meeting all other requirements  Future Appointments  Date Type Provider Dept   05/27/23 Appointment Desmond Dike, NP [place] [facility] [address] [facility]   Showing future appointments within next 365 days and meeting all other requirements       Labs: Not applicable this refill

## 2023-03-27 NOTE — Unmapped (Signed)
[name] requested a refill of their Mounjaro via IVR/Web. [facility] has scheduled delivery per the patients request via Same Day Courier to be delivered to their prescription address on 03/28/23.

## 2023-03-28 MED FILL — MOUNJARO 12.5 MG/0.5 ML SUBCUTANEOUS PEN INJECTOR: SUBCUTANEOUS | 28 days supply | Qty: 2 | Fill #3

## 2023-03-28 MED FILL — MONTELUKAST 10 MG TABLET: ORAL | 90 days supply | Qty: 90 | Fill #2

## 2023-04-06 ENCOUNTER — Ambulatory Visit
Admit: 2023-04-06 | Discharge: 2023-04-07 | Payer: BLUE CROSS/BLUE SHIELD | Attending: Physical Medicine & Rehabilitation | Primary: Physical Medicine & Rehabilitation

## 2023-04-06 DIAGNOSIS — Z981 Arthrodesis status: Principal | ICD-10-CM

## 2023-04-06 DIAGNOSIS — G8929 Other chronic pain: Principal | ICD-10-CM

## 2023-04-06 DIAGNOSIS — M533 Sacrococcygeal disorders, not elsewhere classified: Principal | ICD-10-CM

## 2023-04-06 DIAGNOSIS — M5412 Radiculopathy, cervical region: Principal | ICD-10-CM

## 2023-04-06 NOTE — Unmapped (Addendum)
Thanks so much for coming to see Dr. Riccardo Dubin today. It was a pleasure to meet you. This summary reviews the goals and plans we discussed at your visit today.     Below, you will see: a) your working diagnosis b) your treatment plan and c) your next steps and followup plan.    We care about your quality of life and are committed to helping optimize your functionality.     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    TREATMENT PLAN:   Referred to physical therapy at the [facility] for the neck and back. If you have not been contacted in 1-2 weeks, please call [phone]    Lumbar x-ray is pending. Ordered cervical x-rays in addition.   You can go to the address listed below to complete your test(s). You do not need an appointment for this since walk-ins are accepted.  [facility] - [facility] at [facility]   Address: [address], [place], [place] [address]  Phone: [phone]    Ordered EMG test to further evaluate radicular symptoms. If you have not been contacted within 2 weeks, please call [phone] to schedule. This test will be located at:   [facility] (EMG/EEG)  [facility], 1st Floor  [address] [place], [place] [address]     NEXT STEPS/FOLLOW UP:  Can consider cervical/lumbar MRI if symptoms worsen or fail to improve after 6 weeks of physical therapy

## 2023-04-06 NOTE — Unmapped (Signed)
[facility]   Physical Medicine and Rehabilitation     Patient Name:Darlene Lucas  MRN: [id number]  DOB: 14-Oct-1970  Age: 53 y.o.     ----------------------------------------------------------------------------------------------------------------------  April 06, 2023 9:07 AM. Documentation assistance provided by Hetty Blend, medical scribe, at the direction of Inocencio Homes, MD.  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    --New right cervical radicular symptoms    --New thoracic axial pain    TREATMENT PLAN:   Referred to physical therapy at the [facility] for the neck and back. If you have not been contacted in 1-2 weeks, please call [phone]    Lumbar x-ray is pending. Ordered cervical x-rays in addition.   You can go to the address listed below to complete your test(s). You do not need an appointment for this since walk-ins are accepted.  [facility] - [facility]   Address: [address] [place], [place] [address]  Phone: [phone]    Ordered EMG test to further evaluate radicular symptoms. If you have not been contacted within 2 weeks, please call [phone] to schedule. This test will be located at:   [facility] (EMG/EEG)  [facility], 1st Floor  [address], [place], [place] [address]     NEXT STEPS/FOLLOW UP:  Can consider cervical/lumbar MRI if symptoms worsen or fail to improve after 6 weeks of physical therapy    PROCEDURE: Bilateral sacroiliac (SI) joint and ligament injection with ultrasound guidance  INDICATION: sacroiliac joint dysfunction and buttock pain  TECHNIQUE: After confirming written and informed consent discussing risks of bleeding, infection, nerve injury, medication reaction, and alternative of not doing the procedure, the patient was placed in the prone position. Pre-procedure vitals were reviewed. A TIME OUT was performed. The patient did not require sedation for the procedure. Using universal sterile precautions, procedure equipment and medications were prepared in sterile fashion and skin was prepped with chlorhexidine. In addition a sterile ultrasound probe cover was utilized as well as sterile ultrasound gel. Ultrasound visualization was utilized to delineate the anatomy of the sacrum and thereafter used to guide needle placement. The Bilateral SI joint was identified.  Skin was anesthetized using 2ml of 1% lidocaine without epinephrine. A 20 gauge 3.5 inch needle was directed toward the target using sonographic guidance. Under Ultrasound guidance, each SI joint was then injected with 5mg  dexamethasone and 1.5 cc of 1% lidocaine for a total injectate volume of 2 cc. Negative aspiration was noted prior to injection. The needle tip was visualized at a depth of 3 cm bilaterally. No paresthesias were reported with needle placement. The needle(s) were removed intact and a bandage was applied. The patient was monitored, reassessed and discharged after an appropriate observatory period.  COMPLICATIONS: The patient tolerated the procedure well and there were no complications.  PRE-PROCEDURE PAIN SCORE: 7 left, 6 right  POST-PROCEDURE PAIN SCORE: 5 Left, 4, right  POST-PROCEDURE EXAM: Marked improvement in pain.  No new neurologic compromise; patient discharged with normal baseline neurologic function.    Assistant: RN Darl Pikes    The imaging is on file and stored in a permanent  location.      SUBJECTIVE:     Chief Complaint:   Back pain, RUE radic, repeat SI injections    04/06/2023  Pt presents in f/u after receiving b/l SIJ injection at last visit, referring to PT, and ordering lumbar x-ray . Today pt reports the SI joint injections provide enough relief to keep her pain manageable. She is planning to get her lumbar x-ray done. She feels like she may be having some bursitis in her left hip region. She is having a hard time standing back up from bending over due to her back and has new shooting pains down R arm to the fingers (digits 3 and 4 primarily) similar to radicular pain in her RLE. She endorses a severe itching sensation below her bra strap, which is usually associated w/ disc issues for her. She would like to proceed with repeat b/l SI joint injections today.     11/05/2022  Pt presents in f/u after receiving repeat bilateral SI joint injections at last visit. Today pt reports an itching sensation in the midback above her fusion, which she has associated with the onset of disc pain in the past. Also endorses clicking and popping in spine. She reports some pain shooting down arm but it is not specific. She also has noticed worsened posture with her gait recently. She is concerned this is due to worsening back pain.     07/16/22:  Pt presents in follow up after receiving repeat bilateral SI joint injections at last visit. Today, reports that the prior injection provided 70% relief. Pain has returned over the past week.    02/17/22:  Pt presents in f/u for repeat SIJ injections after receiving bilateral SIJ injections at last visit and ordering L5-S1 facet injections. Today pt reports previous bilateral SIJ injections provided 100% relief for 3-4 months. They help her to walk which in turn helps her to lose weight. She is starting PT today. Pt has lost 127 pounds since the first time she has started seeking help for her back pain.    10/16/21:  Pt presents in f/u for bilateral SI joint injections after ordering L5-S1 facet injections . Today pt reports recent knee surgery about a month ago. No current ABX.   Wants bilateral SI inj.    07/08/21:   Pt presents in f/u after receiving bilateral facet injections at L5/S1 on 01/26/21. Today pt reports facet and SI injections were both helpful. Pt reports that the SI joint injections were more beneficial for pain than the facet injections. She finds injections are more effective when done around the same time.     01/21/21:  Pt presents in f/u for bilateral SI joint injections. Bilateral L5/S1 facet injections scheduled for 01/26/21. Today pt reports she'd like bilateral SI joint injections. She hasn't had SI injections in a year. She usually gets good relief from a schedule of SI joint injections and facet injections. States right side pain is radiating around the right side. She feels she needs injections every 6-8 mos to stay ahead of the pain.     12/18/20  Pt presents in f/u (previous Dr. Oneida Arenas patient) after receiving bilateral L5/S1 facet injections and bilateral SI joint injection at last visit (01/28/20).     Chronic midline low back pain currently controlled though currently seeking repeat bilateral SI joint and facet injections.  Patient endorses not being interested in SI joint radiofrequency ablation if injections become sub  Therapeutic. Patient notes losing 50 lbs intentionally.  Symptom Location: Bilateral SI joint pain with radicular component halfway down posterior bilateral thights.   Symptom Character: burning and sharp  Symptom Onset/Mechanism: chronic (>/= 3 months), symptoms flared over last 4 months.   Temporal Pattern: constant  Night Pain: no  Unintended Weight Loss: no  Fever/Infection:no  Neuromotor Function: motor weakness - (yes) endorses chronic, permanant nerve weakness in R>L legs,  gait/coordination disturbance - (no), loss of bowel or bladder control - (no), saddle anesthesia - (no)     History of Present Illness:   Ms. Tyrell is a 53 y.o. year old female being evaluated in follow up.   01/15/2020: 90% relief from bilateral L5-S1 facet joint injections and bilateral SI joint injections at last visit. Has been dealing with chronic right ankle pain from previous surgeries with Dr. Deborah Chalk and working on weight [facility] with [facility]. Would like to repeat bilateral facet and SI joint injections given significant improvement in symptoms for >6 months with 80% relief.    Prior Interventions/Modalities:  - oxycodone, fentanyl    Meds:  - celebrex 200 mg BID      Prior Diagnostics:  XR Lumbar Spine 02/27/19  IMPRESSION:  -- Unchanged sequelae of posterior decompression and posterior spinal fixation extending from L2 to L5. No adverse radiographic hardware features.  -- Unchanged mild degenerative changes of the lumbar spine.    XR Pelvis 02/22/19  IMPRESSION:  1. Posterior decompression and posterior fusion L2-L5 with interbody spacer at L4-L5. No evidence of hardware complication.  2. Unchanged 2 mm retrolisthesis of L1 on L2, likely degenerative.  3. Mild degenerative disc disease at T12-L1 and L1-L2 and moderate degenerative disc disease at L5-S1, similar to the prior 2016 CT.  4. Bilateral sacroiliac fixation with periarticular sclerosis, likely reactive/postoperative.    Current Outpatient Medications   Medication Sig Dispense Refill    aspirin 81 MG chewable tablet Chew 1 tablet (81 mg total) daily. 30 tablet 0    blood sugar diagnostic (ACCU-CHEK GUIDE TEST STRIPS) Strp Use to check blood sugar once daily as directed 100 strip 3    calcium citrate-vitamin D3 (CITRACAL + D MAXIMUM) 315 mg-6.25 mcg (250 unit) per tablet Take 1 tablet by mouth Two (2) times a day. 60 tablet 1    celecoxib (CELEBREX) 200 MG capsule Take 1 capsule (200 mg total) by mouth two (2) times a day. 180 capsule 3    chlorhexidine (PERIDEX) 0.12 % solution RINSE WITH CAPFUL ONCE DAILY THEN SPIT. DO NOT RINSE, EAT, OR DRINK 30 MINUTES AFTER USE      conjugated estrogens (PREMARIN) 0.625 mg/gram vaginal cream Insert 0.5 g into the vagina daily. 30 g 11    diclofenac sodium 1.5 % Drop Apply 40 drops topically four (4) times a day as needed for pain 150 mL 2    estrogens, conjugated, (PREMARIN) 0.3 MG tablet Take 1 tablet (0.3 mg total) by mouth daily. 90 tablet 1    finasteride (PROPECIA) 1 mg tablet Take 1 tablet (1 mg total) by mouth daily. 90 tablet 0    lancets (ACCU-CHEK SOFTCLIX LANCETS) Misc Use to check blood sugar once daily as directed 100 each 3    levothyroxine (SYNTHROID) 50 MCG tablet Take 1 tablet (50 mcg total) by mouth daily. 90 tablet 1    metFORMIN (GLUCOPHAGE-XR) 500 MG 24 hr tablet Take 2 tablets (1,000 mg total) by mouth daily with evening meal. 120 tablet 5    minoxidil (LONITEN) 2.5 MG tablet Take 1/2 tablet (1.25  mg total) by mouth daily. 45 tablet 0    montelukast (SINGULAIR) 10 mg tablet Take 1 tablet (10 mg total) by mouth daily. 90 tablet 3    pantoprazole (PROTONIX) 40 MG tablet Take 1 tablet (40 mg total) by mouth two (2) times a day. (Patient not taking: Reported on 02/09/2023) 180 tablet 1    pravastatin (PRAVACHOL) 40 MG tablet Take 1 tablet (40 mg total) by mouth nightly. 90 tablet 1    predniSONE (DELTASONE) 10 MG tablet 2 tablets twice a day for 3 days then 1 tablet twice day for 3 days then 1 tablet per day for 3 day. 21 tablet 0    spironolactone (ALDACTONE) 100 MG tablet Take 1 tablet (100 mg total) by mouth two (2) times a day. 180 tablet 0    tirzepatide (MOUNJARO) 10 mg/0.5 mL PnIj Inject 10 mg under the skin every seven (7) days. 2 mL 3    tirzepatide (MOUNJARO) 12.5 mg/0.5 mL PnIj Inject the contents of 1 pen (12.5 mg) under the skin once a week. 2 mL 6    tretinoin (RETIN-A) 0.05 % cream Apply a pea sized amount at night 2-3 times a week x 2 weeks, then every other night for 2 weeks, then nightly. If getting dry, use a facial moisturizer in the morning such as Oil of Olay complete sensitive skin or CereVe AM 45 g 4    VENTOLIN HFA 90 mcg/actuation inhaler Inhale 2 puffs every six (6) hours as needed for wheezing. 54 g 1     No current facility-administered medications for this visit.       Allergies:   Iohexol, Propoxyphene, Shellfish containing products, Sulfa (sulfonamide antibiotics), and Topiramate    Past Medical / Surgical History:     Past Medical History:   Diagnosis Date    Abnormal Pap smear of cervix     Abnormal uterine bleeding     Was told possible adenomyosis, possible need for hysterectomy by Dr. Alvino Chapel. Had [place], and trial of 90 days of provera. 11/29/13 TVUS 5x6x4cm RO 2x1.5x1.5 LO 2x2x 1.6, EMS 5mm, abundant flow near endometrium c/w possible adenomyosis     Allergic Age 76    Amblyopia 1974    Anemia     Arthritis     Asthma     Blepharitis November 2021    BMI 50.0-59.9, adult (CMS-HCC)     Chronic back pain     Constipation     Diabetes mellitus (CMS-HCC) 2020    Difficult intravenous access     Dry eyes     Dysplasia of cervix, high grade CIN 2     Edema     Elevated LFTs     Environmental allergies     Eye trauma 2020    fractured orbit. Unsure which eye.    GERD (gastroesophageal reflux disease)     Goiter 2015    monitoring levels    Headache     Hyperlipidemia     Hypertension 2021    Hypokalemia     Prediabetes     Pulmonary embolism (CMS-HCC) 1989, 1992    following miscarriages     Recurrent UTI     Right leg pain 10/2017    right fibula fracture    Thyroid function study abnormality 2015    seeing endocrine (TSH 5.37 12/25/13, T4 1.02       Past Surgical History:   Procedure Laterality Date    ENDOMETRIAL BIOPSY  11/2013    HYSTERECTOMY      Knee arthroscopy Left 1987    LUMBAR FUSION      L5 - S1 - multiple - also had hardware removed    LUMBAR FUSION  2008    L1-S1    PR ANKLE SCOPE,EXTENS DEBRIDEMNT Right 08/30/2018    Procedure: ARTHROSCOPY ANK SURG; DEBRID EXTEN - modifier 22;  Surgeon: Valda Favia, MD;  [facility]: [facility] OR [facility];  Service: Orthopedics    PR ARTHROCENTESIS ASPIR&/INJ MAJOR JT/BURSA W/O [place] Right 08/30/2018    Procedure: ARTHROCENTESIS, ASPIRATION AND/OR INJECTION; MAJOR JOINT OR BURSA (E.G.SHOULDER/HIP/KNEE); Surgeon: Valda Favia, MD;  Location: [facility] OR [facility];  Service: Orthopedics    PR BIOPSY OF VAGINA,EXTENSIVE N/A 09/20/2014    Procedure: BIOPSY VAGINAL MUCOSA; EXTENSIVE, REQUIRING SUTURE (INCLUDING CYSTS);  Surgeon: Hollice Gong, MD;  Location: [facility] OR [facility];  Service: Advanced Laparoscopy    PR BIOPSY/EXCISION, LYMPH NODE(S) N/A 09/20/2014    Procedure: BX/EXC LYMPH NODE; OPEN, SUPERF (SEPART PROC);  Surgeon: Hollice Gong, MD;  Location: [facility] OR [facility];  Service: Advanced Laparoscopy    PR CLOSED TREATMENT PST MALLEOLUS FRACTURE W/O MANIP Right 11/10/2017    Procedure: CLOSED TREATMENT OF POSTERIOR MALLEOLUS FRACTURE; WITHOUT MANIPULATION;  Surgeon: Valda Favia, MD;  Location: [facility] OR [facility];  Service: Ortho Foot & Ankle    PR COLPOSCOPY,ENTIRE VAGINA,W/BIOPSY(S) N/A 09/20/2014    Procedure: COLPOSCOPY OF THE ENTIRE VAGINA, WITH CERVIX IF PRESENT; WITH BIOPSY(S) OF VAGINA/CERVIX;  Surgeon: Hollice Gong, MD;  Location: [facility] OR [facility];  Service: Advanced Laparoscopy    PR COLSC FLX W/RMVL OF TUMOR POLYP LESION SNARE TQ N/A 12/20/2019    Procedure: COLONOSCOPY FLEX; W/REMOV TUMOR/LES BY SNARE;  Surgeon: Jules Husbands, MD;  Location: [facility];  Service: Gastroenterology    PR CYSTOURETHROSCOPY N/A 06/13/2014    Procedure: CYSTOURETHROSCOPY (SEPARATE PROCEDURE);  Surgeon: Hollice Gong, MD;  Location: [facility] OR [facility];  Service: Advanced Laparoscopy    PR KNEE SCOPE,MED OR LAT MENIS REPAIR Right 09/15/2021    Procedure: ARTHROSCOPY, KNEE, SURGICAL; WITH MENISCUS REPAIR (MEDIAL OR LATERAL);  Surgeon: Nickola Major, MD;  Location: OR [facility];  Service: Orthopedics    PR LAP,VAG HYST,UTERUS 250GMS/<,SALP-OOPH N/A 06/13/2014    Procedure: ROBOTIC LAPAROSCOPY, SURGICAL, W/VAGINAL HYSTERECTOMY, UTERUS 250 GRAMS OR LESS; W/REMOVAL OF TUBE(S) &/OR OVARY(S);  Surgeon: Hollice Gong, MD;  Location: [facility] OR [facility];  Service: Advanced Laparoscopy PR OPEN TREATMENT PROXIMAL FIBULA/SHAFT FRACTURE Right 11/10/2017    Procedure: OPEN TREATMENT OF PROXIMAL FIBULA OR SHAFT FRACTURE, INCLUDES INTERNAL FIXATION, WHEN PERFORMED - Modifier 22;  Surgeon: Valda Favia, MD;  Location: [facility] OR [facility];  Service: Ortho Foot & Ankle    PR OPEN TX DISTAL TIBIOFIBULAR JOINT DISRUPTION Right 11/10/2017    Procedure: OPEN TREATMENT OF DISTAL TIBIOFIBULAR JOINT (SYNDESMOSIS) DISRUPTION, INCLUDES INTERNAL FIXATION, IF DONE;  Surgeon: Valda Favia, MD;  Location: [facility] OR [facility];  Service: Ortho Foot & Ankle    PR PELVIC EXAMINATION W ANESTH N/A 09/20/2014    Procedure: PELVIC EXAMINATION UNDER ANESTHESIA (OTHER THAN LOCAL);  Surgeon: Hollice Gong, MD;  Location: [facility] OR [facility];  Service: Advanced Laparoscopy    PR REMOVAL IMPLANT DEEP Right 08/30/2018    Procedure: R20--REMOVE IMPLANT; DEEP--RIGHT ANKLE x 2 - modifier 22;  Surgeon: Valda Favia, MD;  Location: [facility] OR [facility];  Service: Orthopedics    PR REPAIR 1 COLLAT ANKLE LIGMNT,PRIMARY Right 11/10/2017    Procedure:  REPAIR, PRIMARY, DISRUPTED LIGAMENT, ANKLE; COLLATERAL;  Surgeon: Valda Favia, MD;  [facility]: [facility] OR [facility];  Service: Ortho Foot & Ankle    PR UPPER GI ENDOSCOPY,BIOPSY N/A 12/20/2019    Procedure: UGI ENDOSCOPY; WITH BIOPSY, SINGLE OR MULTIPLE;  Surgeon: Jules Husbands, MD;  [facility];  Service: Gastroenterology    SI joint surgery  2004    Bilateral     TONSILLECTOMY      TUBAL LIGATION         Social History     Socioeconomic History    Marital status: Widowed   Tobacco Use    Smoking status: Never    Smokeless tobacco: Never   Vaping Use    Vaping status: Never Used   Substance and Sexual Activity    Alcohol use: Not Currently     Alcohol/week: 0.0 standard drinks of alcohol     Comment: rare    Drug use: Never    Sexual activity: Yes     Partners: Male     Birth control/protection: Bilateral Tubal Ligation   Other Topics Concern    Do you use sunscreen? No    Tanning bed use? Yes    Are you easily burned? No    Excessive sun exposure? No    Blistering sunburns? No   Social History Narrative    Widowed, 07/05/2014    Has 2 children- ages 72, 54.    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                             Social Drivers of Psychologist, prison and probation services Strain: Low Risk  (04/22/2022)    Overall Financial Resource Strain (CARDIA)     Difficulty of Paying Living Expenses: Not very hard   Food Insecurity: No Food Insecurity (04/22/2022)    Hunger Vital Sign     Worried About Running Out of Food in the Last Year: Never true     Ran Out of Food in the Last Year: Never true   Transportation Needs: No Transportation Needs (04/22/2022)    PRAPARE - Transportation     Lack of Transportation (Medical): No     Lack of Transportation (Non-Medical): No   Stress: No Stress Concern Present (07/05/2019)    [employer] of Occupational Health - Occupational Stress Questionnaire     Feeling of Stress : Only a little       Family History   Problem Relation Age of Onset    Pulmonary embolism Mother     Heart attack Mother     Stroke Mother     Allergies Father     Cancer Sister         cervical cancer    No Known Problems Daughter     No Known Problems Daughter     Diabetes Paternal Grandfather     Diabetes Paternal Grandmother     Diabetes Sister     Thyroid disease Sister     Clotting disorder Neg Hx     Anesthesia problems Neg Hx     Bleeding Disorder Neg Hx     Melanoma Neg Hx     Basal cell carcinoma Neg Hx     Squamous cell carcinoma Neg Hx     Breast cancer Neg  Hx     Glaucoma Neg Hx     Macular degeneration Neg Hx        For any of the above entries which indicate no records on file, the patient reports no relevant history.                 Review of Systems:   Review of Systems was completed through a 10 organ system review and is listed in the chart.  Pertinent positives are noted in HPI or flowsheet and otherwise negative.  Patient has been instructed to followup with PCP or appropriate specialist for symptoms outside the purview of this speciality.    Answers submitted by the patient for this visit:  Back Pain Questionnaire (Submitted on 02/16/2022)  Chief Complaint: Back pain  Chronicity: chronic  Onset: more than 1 year ago  Frequency: daily  Progression since onset: gradually worsening  Pain location: gluteal, lumbar spine, sacro-iliac  Pain quality: aching, burning, cramping, shooting, stabbing  Radiates to: left thigh, right thigh  Pain - numeric: 6/10  Pain is: the same all the time  Aggravated by: bending, coughing, position, sitting, standing, stress, twisting  Stiffness is present: all day  abdominal pain: No  bladder incontinence: No  bowel incontinence: No  chest pain: No  dysuria: No  fever: No  headaches: No  leg pain: Yes  numbness: No  paresis: No  paresthesias: Yes  perianal numbness: No  tingling: Yes  weakness: Yes  weight loss: No  OBJECTIVE:     Vitals:     LMP 05/03/2017     The above medications, allergies, history, and ROS, and vitals have been reviewed.    Physical Exam:   GEN: alert and oriented, no apparent distress  HEENT: normocephalic, atraumatic, anicteric, moist mucous membranes  CV: normal heart rate  PULM: normal work of breathing  GI: nondistended  EXT: no swelling, edema in b/l UE and LE  SKIN: no visible ecchymosis or breakdown  PSYCH: normal mood and affect    NEURO:     Reflexes     Left Upper Extremity Right Upper Extremity    Hoffman's  negative negative        MSK:    SI Joint:  Patrick's: positive 7/10 LLE, positive 6/10 RLE    ----------------------------------------------------------------------------------------------------------------------  April 06, 2023 9:07 AM. Documentation assistance provided by Hetty Blend, medical scribe, at the direction of Riccardo Dubin, Judi Saa, MD.  ----------------------------------------------------------------------------------------------------------------------     Tia Masker, MD  Assistant Professor - PM&R  Musculoskeletal and Spine Specialist - [facility] [employer]

## 2023-04-07 ENCOUNTER — Encounter: Admit: 2023-04-07 | Discharge: 2023-04-08 | Payer: BLUE CROSS/BLUE SHIELD

## 2023-04-07 DIAGNOSIS — N3000 Acute cystitis without hematuria: Principal | ICD-10-CM

## 2023-04-07 DIAGNOSIS — J069 Acute upper respiratory infection, unspecified: Principal | ICD-10-CM

## 2023-04-07 MED ORDER — FLUCONAZOLE 150 MG TABLET
ORAL_TABLET | Freq: Once | ORAL | 0 refills | 2.00 days | Status: CP
Start: 2023-04-07 — End: 2023-04-07

## 2023-04-07 MED ORDER — DOXYCYCLINE HYCLATE 100 MG CAPSULE
ORAL_CAPSULE | Freq: Two times a day (BID) | ORAL | 0 refills | 7.00 days | Status: CP
Start: 2023-04-07 — End: 2023-04-14

## 2023-04-07 MED ORDER — PREDNISONE 20 MG TABLET
ORAL_TABLET | Freq: Every day | ORAL | 0 refills | 5.00 days | Status: CP
Start: 2023-04-07 — End: 2023-04-12

## 2023-04-07 NOTE — Unmapped (Addendum)
Cough, sinus congestion, discolored mucus, chest tightness.  Albuterol not helping.  Reports ongoing fo months now.  Treated with amox and prednisone back in November but only temporary relief.  Negative COVID.  Will treat with doxycycline 100mg  BID X 7 days. Rx sent in for diflucan 150mg  X 1 dose. Repeat dose in 3 days if needed. Rx sent in for prednisone 40mg  daily X 5 days. Continue albuterol.  Follow up if symptoms persist or worsen. Continue to work on Industrial/product designer.  Patient verbalizes understanding and has no further questions at this time.

## 2023-04-07 NOTE — Unmapped (Signed)
Started early this morning with UTI symptoms.  Dysuria, suprapubic pressure, frequency.  Denies risk of STI's.  Denies fever, chills, flank pain.  Have started her on doxycycline for the URI and this should help treat UTI as well.  Follow up if symptoms persist or worsen.  Should improve over the next 48 hours.  Patient verbalizes understanding and has no further questions at this time.

## 2023-04-07 NOTE — Unmapped (Signed)
[employer] education officer Encounter  This medical encounter was conducted virtually using [email or web address]  TeleHealth protocols.    Patient ID: Darlene Lucas is a 53 y.o. female who presents by video interaction for evaluation.    I have identified myself to the patient and conveyed my credentials to Darlene Lucas.   Patient has signed informed consent on file in medical record.    Present on Video Call: Is there someone else in the room? No.    Assessment/Plan:      Problem List Items Addressed This Visit          Respiratory    Protracted URI    Cough, sinus congestion, discolored mucus, chest tightness.  Albuterol not helping.  Reports ongoing fo months now.  Treated with amox and prednisone back in November but only temporary relief.  Negative COVID.  Will treat with doxycycline 100mg  BID X 7 days. Rx sent in for diflucan 150mg  X 1 dose. Repeat dose in 3 days if needed. Rx sent in for prednisone 40mg  daily X 5 days. Continue albuterol.  Follow up if symptoms persist or worsen. Continue to work on Industrial/product designer.  Patient verbalizes understanding and has no further questions at this time.         Relevant Medications    doxycycline (VIBRAMYCIN) 100 MG capsule       Genitourinary    Acute cystitis without hematuria - Primary    Started early this morning with UTI symptoms.  Dysuria, suprapubic pressure, frequency.  Denies risk of STI's.  Denies fever, chills, flank pain.  Have started her on doxycycline for the URI and this should help treat UTI as well.  Follow up if symptoms persist or worsen.  Should improve over the next 48 hours.  Patient verbalizes understanding and has no further questions at this time.         Relevant Medications    doxycycline (VIBRAMYCIN) 100 MG capsule     -- Patient verbalized an understanding of today's assessment and recommendations, as well as the purpose of ongoing medications.    Follow-up with PCP    Medication adherence and barriers to the treatment plan have been addressed. Opportunities to optimize healthy behaviors have been discussed. Patient / caregiver voiced understanding.     Subjective:     HPI  Darlene Lucas is 53 y.o. and presents today in the [facility] with symptoms.  The PCP for this patient is Nile Dear, Emogene Morgan, NP.     Cough, sinus congestion, discolored mucus, chest tightness.  Albuterol not helping.  Reports ongoing fo months now.  Treated with amox and prednisone back in November but only temporary relief.  Negative COVID.    Started early this morning with UTI symptoms.  Dysuria, suprapubic pressure, frequency.  Denies risk of STI's.  Denies fever, chills, flank pain.    ROS  Review of Systems     All other ROS per HPI.    I have reviewed the problem list, past medical history, past family history, medications, and allergies and have updated/reconciled them if needed.     Objective:   Physical Exam  As part of this Video Visit, no in-person exam was conducted.  Video interaction permitted the following observations.    General: No acute distress.   HEENT:  EOMI.  No photophobia. No conjunctival injection.    RESP: Relaxed respiratory effort. No conversational dyspnea.   SKIN: No rashes noted.  NEURO: No tremors observed.  Normal  speech.   PSYCH: Alert and oriented.  Speech fluent and sensible.  Calm affect.      The patient reports they are physically located in [place] and is currently: at home. I conducted a audio/video visit. I spent  26m 21s on the video call with the patient. I spent an additional 3 minutes on pre- and post-visit activities on the date of service .

## 2023-04-08 NOTE — Unmapped (Unsigned)
[facility]  Adult Audiology     HEARING AID CONSULTATION     PATIENT: Darlene Lucas, Darlene Lucas  DOB: September 19, 1970  MRN: [id number]  DOS: 04/12/2023    Darlene Lucas presented to clinic today for a consultation visit today to discuss amplification options. {Audiology Companion:88137}      SUBJECTIVE REPORT     Patient identified the following listening needs/goals:  {COSIGoals:104877}  {COSIGoals:104877}  {COSIGoals:104877}    Pertinent comments by family members and/or care givers: ***    CLINICAL OBSERVATIONS     Considerations reported today that should be taken into account when considering hearing aid options:  []  Manual dexterity challenges  []  Visual acuity  []  Known cognitive concerns (i.e., memory loss)  []  ***    IMPRESSIONS     Discussed amplification style and technology level best suited for patient's lifestyle. Due to reported concerns/considerations as well as patient listening needs/goals, the following features were identified as important to the patient:  []  Hearing Aid style: {HearingAidStyles:83895}  []  Rechargeable batteries  []  Bluetooth connectivity. Patient has the following smartphone model: ***  []  Accessories: TV streamer, Remote microphone    PLAN     The following hearing aid manufacturers and models were discussed today:   {HearingAidManufacturers:83896} *** {HearingAidStyles:83895}  {HearingAidManufacturers:83896} *** {HearingAidStyles:83895}  {HearingAidManufacturers:83896} *** {HearingAidStyles:83895}    Patient provided with literature from the [facility]).     EARMOLD IMPRESSION(S)      Otoscopy  RIGHT Ear: {otoscopy:64378::clear external auditory canal}  LEFT Ear: {otoscopy:64378::clear external auditory canal}    Cerumen Management: ***Cerumen was removed today without incident prior to completing hearing aid fitting procedures.    Earmold impressions were taken on {Desc; right/left/bilateral:5002} without incident.     ORDERING     Hearing aid(s):     Right Ear Left Ear Order Date {nachoice:70225} {nachoice:70225}   Location {location:87878} {location:87878}   Manufacturer {hamanu:70224} {hamanu:70224}   Model {nachoice:70225} {nachoice:70225}   Color {nachoice:70225} {nachoice:70225}    Serial Number     HAF Date     Warranty     Receiver strength and size {RECLENGTH:70226} {RECLENGTH:70226}   Slimtube Length  {STLENGTH:70227} {STLENGTH:70227}   Dome {domesize:70229} {DOMES:70228} {domesize:70229} {DOMES:70228}   Ear mold {nachoice:70225} {nachoice:70225}   Bluetooth     Battery {battery:71346} {battery:71346}   Charger Serial Number     Charger Warranty     Loss and Damage Claim  {landd:71347} {landd:71347}   CPT Codes {cptcodes:88586} {cptcodes:88586}     Bilateral Amplification:     Right Ear Left Ear   Order Date {nachoice:70225} {nachoice:70225}   Location {location:87878} {location:87878}   Manufacturer {hamanu:70224} {hamanu:70224}   Model {nachoice:70225} {nachoice:70225}   Color {nachoice:70225} {nachoice:70225}    Serial Number     HAF Date     Warranty     Receiver strength and size {RECLENGTH:70226} {RECLENGTH:70226}   Slimtube Length  {STLENGTH:70227} {STLENGTH:70227}   Dome {domesize:70229} {DOMES:70228} {domesize:70229} {DOMES:70228}   Ear mold {nachoice:70225} {nachoice:70225}   Bluetooth    Battery {battery:71346}   Accessory(s)    Accessory Warranty    Charger Serial Number    Charger Warranty    Loss and Damage Claim  {landd:71347} {landd:71347}   CPT Codes {cptcodes:88586} {cptcodes:88586}     Ear molds:     Right Ear Left Ear   Order Date {nachoice:70225} {nachoice:70225}   Location {location:87878} {location:87878}   Manufacturer {hamanu:70224} {hamanu:70224}   Serial Number      Style/Type  {emstyle:90852} {emstyle:90852}   Material  {emmaterial:90851} {emmaterial:90851}   Color  {  nachoice:70225} {nachoice:70225}   Vent size {nachoice:70225} {nachoice:70225}   Removal option  {nachoice:70225} {nachoice:70225}   CPT Codes {cptcodes:88586} {cptcodes:88586} COUNSELING     [name] was counseled regarding realistic expectations and the role of the audiologist.    A price quote was signed and provided to patient. Patient was provided contact information for our financial counselor should [name] have any financial concerns or questions.    Policies:  A payment of at least half the total cost is expected on the day of the fitting. Payment plans can be arranged with our financial counselor.   A 30-day trial period is provided with the hearing aid(s). If hearing aids are returned during the 30-day trial period in good working order, the purchase price of the hearing aid(s) and/or accessories will be returned to you unless other account balances exist at [facility] and/or [facility]. If the hearing aid(s) are lost or damaged within the trial period, the hearing aid(s) cannot be returned.   The cost of the fitting fee(s) is non-refundable  The cost of earmold(s) is non-refundable      RECOMMENDATIONS      Refer to ENT re: new ID of hearing loss    Consider trial with amplification pending medical clearance and patient motivation    Continue to monitor hearing annually   Contact clinic when ready to order hearing aids   {HA consult recommendations:96018}   {HA consult recommendations:96018}   {HA consult recommendations:96018}     ***, {Bachelor's ZOXWRU:04540}  Audiology Graduate Student Clinician    I was physically present and immediately available to direct and supervise tasks that were related to patient management. The direction and supervision was continuous throughout the time these tasks were performed.    Charges associated with today's visit:  {UNCADULTCODINGHACONSULT:74145}    Visit Time: {unccivisittime:73518}    Christy Gentles, AUD, AuD  Clinical Audiologist  [facility] Adult Audiology Program  Scheduling: [phone]

## 2023-04-12 ENCOUNTER — Encounter: Admit: 2023-04-12 | Discharge: 2023-04-13 | Payer: BLUE CROSS/BLUE SHIELD | Attending: Family | Primary: Family

## 2023-04-12 ENCOUNTER — Ambulatory Visit: Admit: 2023-04-12 | Discharge: 2023-04-13 | Payer: BLUE CROSS/BLUE SHIELD

## 2023-04-12 DIAGNOSIS — N3000 Acute cystitis without hematuria: Principal | ICD-10-CM

## 2023-04-12 MED ORDER — DOXYCYCLINE HYCLATE 100 MG CAPSULE
ORAL_CAPSULE | Freq: Two times a day (BID) | ORAL | 0 refills | 7.00 days | Status: CP
Start: 2023-04-12 — End: 2023-04-19

## 2023-04-12 NOTE — Unmapped (Signed)
[facility] Primary Care at [facility] Telephonic/Telehealth noteDan Humphreys, [place] [address]. Phone [phone]    Date of Service:  04/14/2023    Darlene Lucas    Date of Birth:  [birth year]    Chief Complaint   Patient presents with    Urinary Tract Infection     Was on 200 mg Doxycycline x7 days, stated it was not effective. C/o urinary urgency, cloudy urine and pain. States she feels like it's a kidney infection. Reports 9/10 bladder pain, 6/10 back pain    Congestion       I spent 20 minutes on the phone with the patient. I spent an additional 10 minutes on pre- and post-visit activities.     The patient was physically located in [place] or a state in which I am permitted to provide care. The visit was reasonable and appropriate under the circumstances given the patient's presentation at the time.    She has been advised of the potential risks and limitations of this mode of treatment (including, but not limited to, the absence of in-person examination) and has agreed to be treated using telemedicine. The patient's/patient's family's questions regarding telemedicine have been answered.     She has been advised to contact their provider???s office for worsening conditions, and seek emergency medical treatment and/or call 911 if the patient deems either necessary.      Assessment/Plan:     Diagnosis ICD-10-CM Plan/Associated Orders    Acute cystitis without hematuria       URI, possible Sinusitis/Bronchitis N30.00 doxycycline (VIBRAMYCIN) 100 MG capsule    Tx with Doxy and Office eval offered and advised           HPI:  Darlene Lucas is a 53 y.o. female for evaluation of Cystitis and Sinus congestion. Tx at Urgent Care with Doxy.    Denies current Fever, chest pain, N/V/D, bowel issues, or swelling.     ROS: 12 systems reviewed, no other listed complaints.    Pertinent Past Med Hx:    Past Medical History:   Diagnosis Date    Abnormal Pap smear of cervix     Abnormal uterine bleeding     Was told possible adenomyosis, possible need for hysterectomy by Dr. Alvino Chapel. Had [place], and trial of 90 days of provera. 11/29/13 TVUS 5x6x4cm RO 2x1.5x1.5 LO 2x2x 1.6, EMS 5mm, abundant flow near endometrium c/w possible adenomyosis     Allergic Age 58    Amblyopia 1974    Anemia     Arthritis     Asthma     Blepharitis November 2021    BMI 50.0-59.9, adult (CMS-HCC)     Chronic back pain     Constipation     Diabetes mellitus (CMS-HCC) 2020    Difficult intravenous access     Dry eyes     Dysplasia of cervix, high grade CIN 2     Edema     Elevated LFTs     Environmental allergies     Eye trauma 2020    fractured orbit. Unsure which eye.    GERD (gastroesophageal reflux disease)     Goiter 2015    monitoring levels    Headache     Hyperlipidemia     Hypertension 2021    Hypokalemia     Prediabetes     Pulmonary embolism (CMS-HCC) 1989, 1992    following miscarriages     Recurrent UTI     Right leg pain 10/2017  right fibula fracture    Thyroid function study abnormality 2015    seeing endocrine (TSH 5.37 12/25/13, T4 1.02       Medications:     Current Outpatient Medications:     blood sugar diagnostic (ACCU-CHEK GUIDE TEST STRIPS) Strp, Use to check blood sugar once daily as directed, Disp: 100 strip, Rfl: 3    calcium citrate-vitamin D3 (CITRACAL + D MAXIMUM) 315 mg-6.25 mcg (250 unit) per tablet, Take 1 tablet by mouth Two (2) times a day., Disp: 60 tablet, Rfl: 1    celecoxib (CELEBREX) 200 MG capsule, Take 1 capsule (200 mg total) by mouth two (2) times a day., Disp: 180 capsule, Rfl: 3    chlorhexidine (PERIDEX) 0.12 % solution, RINSE WITH CAPFUL ONCE DAILY THEN SPIT. DO NOT RINSE, EAT, OR DRINK 30 MINUTES AFTER USE, Disp: , Rfl:     conjugated estrogens (PREMARIN) 0.625 mg/gram vaginal cream, Insert 0.5 g into the vagina daily., Disp: 30 g, Rfl: 11    diclofenac sodium 1.5 % Drop, Apply 40 drops topically four (4) times a day as needed for pain, Disp: 150 mL, Rfl: 2    estrogens, conjugated, (PREMARIN) 0.3 MG tablet, Take 1 tablet (0.3 mg total) by mouth daily., Disp: 90 tablet, Rfl: 1    finasteride (PROPECIA) 1 mg tablet, Take 1 tablet (1 mg total) by mouth daily., Disp: 90 tablet, Rfl: 0    lancets (ACCU-CHEK SOFTCLIX LANCETS) Misc, Use to check blood sugar once daily as directed, Disp: 100 each, Rfl: 3    levothyroxine (SYNTHROID) 50 MCG tablet, Take 1 tablet (50 mcg total) by mouth daily., Disp: 90 tablet, Rfl: 1    metFORMIN (GLUCOPHAGE-XR) 500 MG 24 hr tablet, Take 2 tablets (1,000 mg total) by mouth daily with evening meal., Disp: 120 tablet, Rfl: 5    minoxidil (LONITEN) 2.5 MG tablet, Take 1/2 tablet (1.25 mg total) by mouth daily., Disp: 45 tablet, Rfl: 0    montelukast (SINGULAIR) 10 mg tablet, Take 1 tablet (10 mg total) by mouth daily., Disp: 90 tablet, Rfl: 3    pantoprazole (PROTONIX) 40 MG tablet, Take 1 tablet (40 mg total) by mouth two (2) times a day., Disp: 180 tablet, Rfl: 1    pravastatin (PRAVACHOL) 40 MG tablet, Take 1 tablet (40 mg total) by mouth nightly., Disp: 90 tablet, Rfl: 1    spironolactone (ALDACTONE) 100 MG tablet, Take 1 tablet (100 mg total) by mouth two (2) times a day., Disp: 180 tablet, Rfl: 0    tirzepatide (MOUNJARO) 10 mg/0.5 mL PnIj, Inject 10 mg under the skin every seven (7) days., Disp: 2 mL, Rfl: 3    tirzepatide (MOUNJARO) 12.5 mg/0.5 mL PnIj, Inject the contents of 1 pen (12.5 mg) under the skin once a week., Disp: 2 mL, Rfl: 6    tretinoin (RETIN-A) 0.05 % cream, Apply a pea sized amount at night 2-3 times a week x 2 weeks, then every other night for 2 weeks, then nightly. If getting dry, use a facial moisturizer in the morning such as Oil of Olay complete sensitive skin or CereVe AM, Disp: 45 g, Rfl: 4    VENTOLIN HFA 90 mcg/actuation inhaler, Inhale 2 puffs every six (6) hours as needed for wheezing., Disp: 54 g, Rfl: 1    aspirin 81 MG chewable tablet, Chew 1 tablet (81 mg total) daily., Disp: 30 tablet, Rfl: 0    doxycycline (VIBRAMYCIN) 100 MG capsule, Take 1 capsule (100 mg total) by mouth two (  2) times a day for 7 days., Disp: 14 capsule, Rfl: 0     Allergies:   Allergies   Allergen Reactions    Iohexol Anaphylaxis      Desc: IVP DYE   OK WITH 13 HR PREP    Propoxyphene Hives     Uncoded Allergy. Allergen: SHELLFISH    Shellfish Containing Products Anaphylaxis     Patient is ok with topical betadine.    Sulfa (Sulfonamide Antibiotics) Hives    Topiramate Other (See Comments)     Other Reaction: OTHER REACTION  Other Reaction: OTHER REACTION  Increase in HR     Objective:  Gen: WDWN appears sick and uncomfortable  Skin: no clear lesion  Nose: Congested  Ext: No clear swelling    Diag:    Lab Results   Component Value Date    WBC 8.2 04/22/2022    HGB 13.4 04/22/2022    HCT 38.1 04/22/2022    PLT 294 04/22/2022       Lab Results   Component Value Date    NA 138 02/25/2023    K 4.5 02/25/2023    CL 102 02/25/2023    CO2 23.0 02/25/2023    BUN 25 (H) 02/25/2023    CREATININE 0.85 02/25/2023    GLU 107 (H) 02/25/2023    CALCIUM 9.8 02/25/2023       Lab Results   Component Value Date    BILITOT 1.4 (H) 02/25/2023    PROT 7.8 02/25/2023    ALBUMIN 4.3 02/25/2023    ALT 32 02/25/2023    AST 24 02/25/2023    ALKPHOS 64 02/25/2023       No results found for: PT, INR, APTT       Dr. Betha Loa, DNP, FNP-BC  Board Certified Doctor of Nursing Practice   [phone]

## 2023-04-19 NOTE — Unmapped (Unsigned)
Assessment/Plan:    # Glaucoma evaluation: both eyes  -- Age: 53  -- Race: Black  -- Family history: ***  -- Trauma: {yes no:22180}  -- Additional risks:  -- Medical/Medications:   -- Treatment history:       - Glaucoma rx:   -- Color plates:   -- TMax: ***/***  -- IOP: ***/***  -- CCT: ***/***  -- Gonio: (04/22/23): ***  -- Optic Nerves: right eye:  0.55/0.55     left eye: 0.65/0.65   -- OCT RNFL: 04/22/23      -***  -- HVF: 04/22/23     -***  -- Disc Heme History: {BHSSRI List:37643}  -- Impression:  --Lengthy discussion w/ patient regarding findings/diagnosis and need for observation or treatment and compliance with clinical follow-up as appropriate.  --Patient understands therapeutic goals and shares in the commitment to reach goals.    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  -RTC in 1 year for follow-up or sooner if changes in vision develop.     # Cataract both eyes  -mild  -Pt. satisfied with vision and does not desire sx at this time.   -monitor  -RTC PRN or sooner if vision changes.     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  -Observe  - RTC: PRN or sooner if changes in vision develop.     # Dry Eye Syndrome: both eyes  -Discussed findings & diagnosis with patient.  All questions were answered. Discussed treatment options.  -Patient given information w/ OTC artificial tears (AT) & lubricants listed.    --Plan:     --use AT as needed.    --RTC as scheduled or sooner if symptoms do not improve or if they worsen.     # Questionable amblyopia OU  - per patient  -baseline VA: right eye:20/20-; left eye: 20/25   - observe

## 2023-04-22 ENCOUNTER — Ambulatory Visit: Admit: 2023-04-22 | Payer: BLUE CROSS/BLUE SHIELD

## 2023-04-22 DIAGNOSIS — J4520 Mild intermittent asthma, uncomplicated: Principal | ICD-10-CM

## 2023-04-22 MED ORDER — FLUTICASONE PROPIONATE 50 MCG/ACTUATION NASAL SPRAY,SUSPENSION
Freq: Every day | NASAL | 3 refills | 120.00 days | Status: CP
Start: 2023-04-22 — End: ?
  Filled 2023-04-26: qty 16, 60d supply, fill #0

## 2023-04-22 NOTE — Unmapped (Signed)
[facility] Refill Coordination Note    Specialty Lite Medication(s) to be Shipped:   Mounjaro    Other medication(s) to be shipped:  Flonase ,  Premarin  tab  ,  Retin-A  cream, Pravastatin ,  Synthroid  , Celebrex  and  Metformin       Darlene Lucas, DOB: [birth year]  Phone: [phone] (home)       All above HIPAA information was verified with patient.     Was a Nurse, learning disability used for this call? No    Changes to medications: [name] reports no changes at this time.  Changes to insurance: No      REFERRAL TO PHARMACIST     Referral to the pharmacist: Not needed      [facility]     Shipping address confirmed in Epic.     Delivery Scheduled: Yes, Expected medication delivery date: 04/26/23 .     Medication will be delivered via Same Day Courier to the prescription address in Epic WAM.    Ricci Barker   [facility] Specialty Technician

## 2023-04-25 ENCOUNTER — Inpatient Hospital Stay: Admit: 2023-04-25 | Discharge: 2023-04-26 | Payer: BLUE CROSS/BLUE SHIELD

## 2023-04-26 MED FILL — PRAVASTATIN 40 MG TABLET: ORAL | 90 days supply | Qty: 90 | Fill #1

## 2023-04-26 MED FILL — RETIN-A 0.05 % TOPICAL CREAM: 30 days supply | Qty: 45 | Fill #1

## 2023-04-26 MED FILL — SYNTHROID 50 MCG TABLET: ORAL | 90 days supply | Qty: 90 | Fill #1

## 2023-04-26 MED FILL — PREMARIN 0.3 MG TABLET: ORAL | 90 days supply | Qty: 90 | Fill #1

## 2023-04-27 ENCOUNTER — Ambulatory Visit
Admit: 2023-04-27 | Discharge: 2023-04-28 | Payer: BLUE CROSS/BLUE SHIELD | Attending: Student in an Organized Health Care Education/Training Program | Primary: Student in an Organized Health Care Education/Training Program

## 2023-04-27 ENCOUNTER — Ambulatory Visit
Admit: 2023-04-27 | Discharge: 2023-04-28 | Payer: BLUE CROSS/BLUE SHIELD | Attending: Otolaryngology | Primary: Otolaryngology

## 2023-04-27 DIAGNOSIS — I10 Essential (primary) hypertension: Principal | ICD-10-CM

## 2023-04-27 DIAGNOSIS — Z7689 Persons encountering health services in other specified circumstances: Principal | ICD-10-CM

## 2023-04-27 DIAGNOSIS — E66811 Obesity, Class I, BMI 30-34.9: Principal | ICD-10-CM

## 2023-04-27 DIAGNOSIS — Z6841 Body Mass Index (BMI) 40.0 and over, adult: Principal | ICD-10-CM

## 2023-04-27 DIAGNOSIS — E119 Type 2 diabetes mellitus without complications: Principal | ICD-10-CM

## 2023-04-27 MED ORDER — METFORMIN ER 500 MG TABLET,EXTENDED RELEASE 24 HR
ORAL_TABLET | Freq: Every day | ORAL | 5 refills | 60.00 days | Status: CP
Start: 2023-04-27 — End: ?
  Filled 2023-04-26: qty 180, 90d supply, fill #1

## 2023-04-27 MED ORDER — MOUNJARO 10 MG/0.5 ML SUBCUTANEOUS PEN INJECTOR
SUBCUTANEOUS | 5 refills | 28.00 days | Status: CP
Start: 2023-04-27 — End: ?
  Filled 2023-04-26: qty 2, 28d supply, fill #4
  Filled 2023-05-25: qty 2, 28d supply, fill #0

## 2023-04-27 NOTE — Unmapped (Signed)
[facility] MEDICAL WEIGHT PROGRAM [place]  [address]  [address]  [place] [address]  Phone: [phone]  Fax: [phone]    Weight Management Clinic    Referring Provider:  Glean Hess*     PATIENT ID:  [name] is a 53 y.o. female with obesity, BMI is Body mass index is 26.72 kg/m??..     Initial MWL evaluation: December 2020     ASSESSMENT AND PLAN:   # Obesity without  h/o bariatric surgery complicated by Back pain due to multiple spinal fusions, history of pulmonary embolism, hypothyroidism, asthma, hyperlipidemia, arthritis of right knee, history of recurrent kidney stones, dysglycemia.    Treatment Course:  Weight at presentation to [facility]: 273lbs (BMI 52)  Current weight: 64.1 kg (141 lb 6.4 oz)  Percent weight loss with Semaglutide/Tirzepatide:  >40%     Diabetes with hyperglycemia has normalized with weight loss  Hypertension has resolved with weight loss.    Weight Management Plan:     Lab evaluation today:  UTD Dec 2024     Bariatric Surgery: Patient doesn't meet criteria for bariatric surgery with BMI 40 or more or BMI 35 or more with Co-morbidity (T2DM, OSA, HTN)    Diet Recommendations: Add more fruits and vegetables to meals, Plan ahead for healthy meals, and Reduce/eliminate processed snacks  - Applauded efforts to date and discussed strategies for maintaining adequate lean protein sources.   - Discussed importance of adequate hydration.      Physical Activity / Exercise: adequate PA for age/risk factors,     - Applauded efforts to date with frequent walking and stationary bike use.  - Encouraged engagement in resistance training to maintain weight/muscle mass, patient engaging in PT.     Sleep:    - No acute concerns.      Stress management: Stress level is currently feels that it is manageable   - No acute concerns.        Weight gain causing medications:  N/A      Anti Obesity Medications:   Patient has had significant response to incretin therapy, remains with continued health improvements including prior cessation of anti-hypertensive treatment.   - Decrease Tirzepatide to 10mg  weekly for continued maintenance therapy.   - OK to continue Metformin at 1g daily for now. Will consider further decrease pending weight trends on Tirzepatide 10mg  weekly.     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 is a 53 y.o.  female referred for medical weight management with obesity, initial BMI >50 complicated by back pain due to multiple spinal fusions, history of pulmonary embolism, hypothyroidism, asthma, hyperlipidemia, arthritis of right knee, history of recurrent kidney stones.  Since prior visit she has overall felt well without significant changes in health. She was able to decrease Metformin to 1000mg  daily. Did not have issues or concerns.  She has been taking/tolerating Tirzepatide 12.5mg  weekly well over the last several months, denies missed doses.  She does feel her weight has declined below goal.     She has dealing with back pain and upper extremity numbness, following with PMR.     Dietary History  - Using lean protein sources with most meals.   - Recently introduced chickpea pasta.    Beverages: Water only. Occasional unsweet cherry juice.  Occasional soda once per week.     Physical Activity:  - Walks 1 mile on track per day and rides stationary bike.   - Historically limited by orthopedic injuries, previously in PT.     Sleep:  - No acute issues.    Stress:  - Lost husband in recent years.  - No acute stressors.     Treatment course:   Comorbid conditions include: back pain due to multiple spinal fusions, history of pulmonary embolism, hypothyroidism, asthma, hyperlipidemia, arthritis of right knee, history of recurrent kidney stones, dysglycemia.    Diabetes diagnosed by prior providers with fasting glucose >126 on multiple occasions.      Patients description of the chronology of weight gain:   - Normal weight early in life.   - Weight gain following hystrectomy in 2016.   - Weight gain following broken ankle in August 2019.     Weight History:  Starting weight at presentation: 273lbs  (BMI 52)   Current weight: 64.1 kg (141 lb 6.4 oz)  Percent weight loss with Semaglutide/Tirzepatide:  >40%     Previous and current use of anti-obesity medications:  Metformin:   yes, current  Phentermine: no  Topiramate:  yes, previously  Possible tachycardia     Qysmia:      no  Bupropion:  no  Naltrexone:   no     Contrave:    no  Setmelanotide: no      ([place])  Liraglutide:  yes, previously      (Victoza, Saxenda)  Semaglutide:  yes, previously      ([place], [place])  Tirzepatide:  yes, currently, tolerating well.      (Mounjaro)  SGLT-2 inhibitor: no     (Farxiga, [place], [place]...)  Orlistat :no     (Xenical, [place]i)  Other agents:  None known       Relevant History:  Migraines/HA: no  Palpitations:  no  Hypertension:  no  Previously stopped losartan.   Glaucoma:  no  Kidney Stones:yes,    Seizures:   no  Gallbladder Disease: no     Gallbladder Surgery:  no  Pancreatitis: no     Gallstone Pancreatitis: no  Personal or family history of Medullary Thyroid Cancer: no    The history section was last reviewed by Desmond Dike, NP on Apr 12, 2023.        Current Outpatient Medications   Medication Sig Dispense Refill    blood sugar diagnostic (ACCU-CHEK GUIDE TEST STRIPS) Strp Use to check blood sugar once daily as directed 100 strip 3    calcium citrate-vitamin D3 (CITRACAL + D MAXIMUM) 315 mg-6.25 mcg (250 unit) per tablet Take 1 tablet by mouth Two (2) times a day. 60 tablet 1    celecoxib (CELEBREX) 200 MG capsule Take 1 capsule (200 mg total) by mouth two (2) times a day. 180 capsule 3  chlorhexidine (PERIDEX) 0.12 % solution RINSE WITH CAPFUL ONCE DAILY THEN SPIT. DO NOT RINSE, EAT, OR DRINK 30 MINUTES AFTER USE      conjugated estrogens (PREMARIN) 0.625 mg/gram vaginal cream Insert 0.5 g into the vagina daily. 30 g 11    diclofenac sodium 1.5 % Drop Apply 40 drops topically four (4) times a day as needed for pain 150 mL 2    estrogens, conjugated, (PREMARIN) 0.3 MG tablet Take 1 tablet (0.3 mg total) by mouth daily. 90 tablet 1    finasteride (PROPECIA) 1 mg tablet Take 1 tablet (1 mg total) by mouth daily. 90 tablet 0    fluticasone propionate (FLONASE) 50 mcg/actuation nasal spray Use 1 spray in each nostril once daily. 16 g 3    lancets (ACCU-CHEK SOFTCLIX LANCETS) Misc Use to check blood sugar once daily as directed 100 each 3    levothyroxine (SYNTHROID) 50 MCG tablet Take 1 tablet (50 mcg total) by mouth daily. 90 tablet 1    metFORMIN (GLUCOPHAGE-XR) 500 MG 24 hr tablet Take 2 tablets (1,000 mg total) by mouth daily with evening meal. 120 tablet 5    minoxidil (LONITEN) 2.5 MG tablet Take 1/2 tablet (1.25 mg total) by mouth daily. 45 tablet 0    montelukast (SINGULAIR) 10 mg tablet Take 1 tablet (10 mg total) by mouth daily. 90 tablet 3    pravastatin (PRAVACHOL) 40 MG tablet Take 1 tablet (40 mg total) by mouth nightly. 90 tablet 1    spironolactone (ALDACTONE) 100 MG tablet Take 1 tablet (100 mg total) by mouth two (2) times a day. 180 tablet 0    tirzepatide (MOUNJARO) 10 mg/0.5 mL PnIj Inject 10 mg under the skin every seven (7) days. 2 mL 3    tirzepatide (MOUNJARO) 12.5 mg/0.5 mL PnIj Inject the contents of 1 pen (12.5 mg) under the skin once a week. 2 mL 6    tretinoin (RETIN-A) 0.05 % cream Apply a pea sized amount at night 2-3 times a week x 2 weeks, then every other night for 2 weeks, then nightly. If getting dry, use a facial moisturizer in the morning such as Oil of Olay complete sensitive skin or CereVe AM 45 g 4    VENTOLIN HFA 90 mcg/actuation inhaler Inhale 2 puffs every six (6) hours as needed for wheezing. 54 g 1    aspirin 81 MG chewable tablet Chew 1 tablet (81 mg total) daily. 30 tablet 0     No current facility-administered medications for this visit.        Allergies   Allergen Reactions    Iohexol Anaphylaxis      Desc: IVP DYE   OK WITH 13 HR PREP    Propoxyphene Hives     Uncoded Allergy. Allergen: SHELLFISH    Shellfish Containing Products Anaphylaxis     Patient is ok with topical betadine.    Sulfa (Sulfonamide Antibiotics) Hives    Topiramate Other (See Comments)     Other Reaction: OTHER REACTION  Other Reaction: OTHER REACTION  Increase in HR     ROS:  12 point ROS negative other than specified above.           Objective     OBJECTIVE:    Vital Signs:  BP 112/82  - Pulse 127  - Wt 64.1 kg (141 lb 6.4 oz)  - LMP 05/03/2017  - BMI 26.72 kg/m??    Wt Readings from Last 5 Encounters:   04/27/23 64.1 kg (  141 lb 6.4 oz)   04/06/23 66.2 kg (146 lb)   02/25/23 66.5 kg (146 lb 8 oz)   01/25/23 66.7 kg (147 lb)   11/05/22 67.1 kg (147 lb 14.9 oz)     Physical Exam:  Gen: alert and oriented, no acute distress  HEENT:      Ocular: non-icteric, non-injected, EOMI    Oral exam : Tongue moist, pink. moderateOP crowding.   NECK: full neck, no cervical LAD.   RESP: CTA bilaterally   CV: RRR, no murmurs appreciated   Abdomen:  Benign, abdominal striae        Moderate  pannus.   Musculoskeletal: ambulatory without help  Extremities:  Peripheral edema trace  Neuro:  No focal deficits.   Skin exam: No acanthosis, No purplish striae  Pscyh: appropriate affect     DATA REVIEW:    Imaging:  N/A      Lab work: reviewed in [facility] and is noted    Hemoglobin A1C   Date Value Ref Range Status   08/23/2022 4.6 (L) 4.8 - 5.6 % Final   04/22/2022 4.6 (L) 4.8 - 5.6 % Final   12/03/2021 4.7 (L) 4.8 - 5.6 % Final   05/16/2014 5.3 4.8 - 6.0 % Final     TSH   Date Value Ref Range Status   02/25/2023 2.769 0.550 - 4.780 uIU/mL Final     ALT   Date Value Ref Range Status   02/25/2023 32 10 - 49 U/L Final   08/23/2022 15 10 - 49 U/L Final   04/22/2022 12 10 - 49 U/L Final     AST   Date Value Ref Range Status   02/25/2023 24 <=34 U/L Final   08/23/2022 17 <=34 U/L Final   04/22/2022 15 <=34 U/L Final     Creatinine   Date Value Ref Range Status   02/25/2023 0.85 0.55 - 1.02 mg/dL Final   96/29/5284 1.32 0.55 - 1.02 mg/dL Final   44/03/270 5.36 0.55 - 1.02 mg/dL Final   64/40/3474 2.59 0.60 - 1.00 mg/dL Final   .

## 2023-04-28 NOTE — Unmapped (Signed)
 Your Mammogram looks fine.

## 2023-05-06 MED FILL — ACCU-CHEK GUIDE TEST STRIPS: 34 days supply | Qty: 50 | Fill #6

## 2023-05-06 MED FILL — ACCU-CHEK SOFTCLIX LANCETS: 34 days supply | Qty: 100 | Fill #1

## 2023-05-16 NOTE — Unmapped (Signed)
 [facility] Refill Coordination Note    Specialty Lite Medication(s) to be Shipped:   Mounjaro    Other medication(s) to be shipped: No additional medications requested for fill at this time     Darlene Lucas, DOB: [birth year]  Phone: There are no phone numbers on file.      All above HIPAA information was verified with patient.     Was a Nurse, learning disability used for this call? No    Changes to medications: [name] reports no changes at this time.  Changes to insurance: No      REFERRAL TO PHARMACIST     Referral to the pharmacist: Not needed      [facility]     Shipping address confirmed in Epic.     Delivery Scheduled: Yes, Expected medication delivery date: 05/27/23.     Medication will be delivered via UPS to the prescription address in Epic WAM.    [name]   [facility] Specialty Technician

## 2023-05-24 ENCOUNTER — Ambulatory Visit
Admit: 2023-05-24 | Discharge: 2023-05-25 | Payer: BLUE CROSS/BLUE SHIELD | Attending: Physical Medicine & Rehabilitation | Primary: Physical Medicine & Rehabilitation

## 2023-05-24 ENCOUNTER — Ambulatory Visit: Admit: 2023-05-24 | Discharge: 2023-05-25 | Payer: BLUE CROSS/BLUE SHIELD

## 2023-05-24 MED ADMIN — triamcinolone acetonide (KENALOG-40) injection 40 mg: 40 mg | INTRA_ARTICULAR | @ 18:00:00 | Stop: 2023-05-24

## 2023-05-24 NOTE — Unmapped (Signed)
 Encounter Provider: Ancil Linsey, NP  Date of Service: 05/24/2023 Last encounter Orthopaedics: 12/14/2022   Last encounter this provider: 12/14/2022      Notes:     Primary Care Provider: Desmond Dike, NP  Referring Provider: Referred Self  No diagnosis found. Orthopaedic notes: - s/p right fibula shaft & syndesmosis ORIF and deltoid suture anchor repair on 11/10/17 (Attending: [facility])  - s/p right ankle arthroscopic debridement and removal of broken syndesmotic screw on 08/30/2018 (Attending: [facility])    Physical Function CAT Score: (not recorded)  Pain Interference CAT Score: (not recorded)  Depression CAT Score: (not recorded)  Sleep CAT Score: (not recorded)  [email or web address]     Darlene Lucas is a 53 y.o. female   ASSESSMENT   Right subtalar joint osteoarthritis         PLAN:     Risks of procedure were discussed.  The patient verbalized understanding and provided consent.  A timeout was performed prior to procedure identifying the correct patient, location, medication.  4 ml lidocaine and 40 mg kenalog were injected into the R subtalar joint using aseptic technique.  The patient tolerated this well.   Advised to avoid vigorous activities for the next few days and then the patient may proceed with activity as tolerated.      Requested Prescriptions      No prescriptions requested or ordered in this encounter      No orders of the defined types were placed in this encounter.      History:  For visit: Follow-up right ankle pain    HPI:  53 y.o. female who reports that her symptoms have recently returned and she would like to repeat steroid injection.  They have always been helpful for her           Exam:  There were no encounter diagnoses.   Estimated body mass index is 26.72 kg/m?? as calculated from the following:    Height as of 04/06/23: 154.9 cm (5' 1).    Weight as of 04/27/23: 64.1 kg (141 lb 6.4 oz). Musculoskeletal   Tender over the sinus Tarsi.  Full ankle and subtalar range of motion with no pain.  No ligamentous laxity at the ankle.  Skin intact with no erythema, edema, or ecchymosis         Test Results  There were no encounter diagnoses.  Lab Results   Component Value Date    A1C 4.6 (L) 08/23/2022       No results found for: VITD    Imaging  No orders of the defined types were placed in this encounter.          DME ORDER:  Dx:  ,

## 2023-05-26 NOTE — Unmapped (Unsigned)
 [facility] Primary Care at [facility] Note:  [place], [place] [address]. Phone [phone]    05/26/2023    Patient Name:   Darlene Lucas    MRN: [id number]    Demographics:    Age-  53 y.o.     Date of Birth-  1971-03-16    Chief complaint (CC):  No chief complaint on file.      Assessment/Plan:    Inna is a pleasant 53 y.o. female with a PMHx of GERD, Goiter, hypothyroidism, allergic rhinitis, HTN, PE, ulnar neuropathy, primary osteoarthritis of both knees, cervical neoplasia grade 2, prediabetes, HLD, obesity, chronic pain syndrome. Followed by [facility]oped[facility] (Dr. Elliot Gurney), [facility] Weight Management (Dr. Hale Bogus), [facility] PM&R (Dr. Leonides Grills), [facility]ermat[facility] (Dr. Irven Easterly), [facility] (Dr. Madelin Rear), [facility]hthal[facility] (Dr. Virgina Evener), [facility] Endocrinology (Dr. Marcella Dubs). Here for medication management, recommendations as below.     BP well controlled, ***. Advised patient continue spironolactone 100 mg bid and pursuit of weight reduction. DASH diet, regular physical activity have been encouraged. Follow up in *** months for BP recheck, sooner prn.     Noted elevations in total cholesterol (240) and LDL cholesterol (145) per last FLP 02/25/23. Reviewed results with patient, advised continued compliance with pravastatin 40 mg nightly and pursuit of lifestyle modifications for management including heart-healthy diet, regular physical activity, weight reduction. Will plan to obtain repeat fasting labs in 3-6 months, sooner prn.     Hx of prediabetes, glucose mildly elevated at 107 per [facility] 02/25/23. Patient denies any fatigue, GI changes, or side effects to current medication regimen. Encouraged continued compliance with Mounjaro 10 mg weekly, metformin 2000 mg daily, low-sugar diet. Will plan to obtain updated A1c today and follow up regarding results once I receive these.     Pneumococcal vaccine ***     Diagnosis ICD-10-CM Associated Orders   1. Essential hypertension  I10       2. Elevated cholesterol  E78.00       3. Prediabetes  R73.03 4. Need for pneumococcal vaccine  Z23           I personally spent *** minutes face-to-face and non-face-to-face in the care of this patient, which includes all pre, intra, and post visit time on the date of service.  All documented time was specific to the E/M visit and does not include any procedures that may have been performed. We discussed medical, dietary, lifestyle, and health maintenance modifications to optimize health. Standard precautions followed during visit. Medication adherence and barriers to the treatment plan have been addressed.  Patient voiced understanding, needs F/U visit ***.     Health Maintenance:   Health Maintenance Due   Topic Date Due    Urine Albumin/Creatinine Ratio  Never done    COVID-19 Vaccine (3 - 2024-25 season) 11/21/2022    Pneumococcal Vaccine 50+ (2 of 2 - PCV) 12/04/2022    Hemoglobin A1c  02/22/2023     PHQ-2 Score:      PHQ-9 Score:      Inocente Salles Score:      {select_status_or_delete_smartlist:64641}    Subjective:    History of present illness (HPI):       Darlene Lucas is a 53 y.o. female who presented to [facility] for medication management. Patient's medical history was reviewed, see Past Medical History. Medications used in the past were reviewed, see medications. Patient reports eating and eliminating well. Pt is sleeping well. Patient {has/not:18111::has not} required emergency room treatment for these symptoms, and {has/not:18111::has not}  required hospitalization.     Denies HA, fever, chest pain, shortness of breath, N/V/D, bowel or bladder issues, vision or hearing changes, or swelling.     Relevant ROS: Reviewed 12 systems, positive findings listed, all others negative.    Pertinent Past Med Hx:    Past Medical History:   Diagnosis Date    Abnormal Pap smear of cervix     Abnormal uterine bleeding     Was told possible adenomyosis, possible need for hysterectomy by Dr. Alvino Chapel. Had [place], and trial of 90 days of provera. 11/29/13 TVUS 5x6x4cm RO 2x1.5x1.5 LO 2x2x 1.6, EMS 5mm, abundant flow near endometrium c/w possible adenomyosis     Allergic Age 47    Amblyopia 1974    Anemia     Arthritis     Asthma     Blepharitis November 2021    BMI 50.0-59.9, adult (CMS-HCC)     Chronic back pain     Constipation     Diabetes mellitus (CMS-HCC) 2020    Difficult intravenous access     Dry eyes     Dysplasia of cervix, high grade CIN 2     Edema     Elevated LFTs     Environmental allergies     Eye trauma 2020    fractured orbit. Unsure which eye.    GERD (gastroesophageal reflux disease)     Goiter 2015    monitoring levels    Headache     Hyperlipidemia     Hypertension 2021    Hypokalemia     Prediabetes     Pulmonary embolism (CMS-HCC) 1989, 1992    following miscarriages     Recurrent UTI     Right leg pain 10/2017    right fibula fracture    Thyroid function study abnormality 2015    seeing endocrine (TSH 5.37 12/25/13, T4 1.02       Medications:       Current Outpatient Medications:     aspirin 81 MG chewable tablet, Chew 1 tablet (81 mg total) daily., Disp: 30 tablet, Rfl: 0    blood sugar diagnostic (ACCU-CHEK GUIDE TEST STRIPS) Strp, Use to check blood sugar once daily as directed, Disp: 100 strip, Rfl: 3    calcium citrate-vitamin D3 (CITRACAL + D MAXIMUM) 315 mg-6.25 mcg (250 unit) per tablet, Take 1 tablet by mouth Two (2) times a day., Disp: 60 tablet, Rfl: 1    celecoxib (CELEBREX) 200 MG capsule, Take 1 capsule (200 mg total) by mouth two (2) times a day., Disp: 180 capsule, Rfl: 3    chlorhexidine (PERIDEX) 0.12 % solution, RINSE WITH CAPFUL ONCE DAILY THEN SPIT. DO NOT RINSE, EAT, OR DRINK 30 MINUTES AFTER USE, Disp: , Rfl:     conjugated estrogens (PREMARIN) 0.625 mg/gram vaginal cream, Insert 0.5 g into the vagina daily., Disp: 30 g, Rfl: 11    diclofenac sodium 1.5 % Drop, Apply 40 drops topically four (4) times a day as needed for pain, Disp: 150 mL, Rfl: 2    estrogens, conjugated, (PREMARIN) 0.3 MG tablet, Take 1 tablet (0.3 mg total) by mouth daily., Disp: 90 tablet, Rfl: 1    finasteride (PROPECIA) 1 mg tablet, Take 1 tablet (1 mg total) by mouth daily., Disp: 90 tablet, Rfl: 0    fluticasone propionate (FLONASE) 50 mcg/actuation nasal spray, Use 1 spray in each nostril once daily., Disp: 16 g, Rfl: 3    lancets (ACCU-CHEK SOFTCLIX LANCETS) Misc, Use to check blood sugar  once daily as directed, Disp: 100 each, Rfl: 3    levothyroxine (SYNTHROID) 50 MCG tablet, Take 1 tablet (50 mcg total) by mouth daily., Disp: 90 tablet, Rfl: 1    metFORMIN (GLUCOPHAGE-XR) 500 MG 24 hr tablet, Take 2 tablets (1,000 mg total) by mouth daily with evening meal., Disp: 120 tablet, Rfl: 5    minoxidil (LONITEN) 2.5 MG tablet, Take 1/2 tablet (1.25 mg total) by mouth daily., Disp: 45 tablet, Rfl: 0    montelukast (SINGULAIR) 10 mg tablet, Take 1 tablet (10 mg total) by mouth daily., Disp: 90 tablet, Rfl: 3    pravastatin (PRAVACHOL) 40 MG tablet, Take 1 tablet (40 mg total) by mouth nightly., Disp: 90 tablet, Rfl: 1    spironolactone (ALDACTONE) 100 MG tablet, Take 1 tablet (100 mg total) by mouth two (2) times a day., Disp: 180 tablet, Rfl: 0    tirzepatide (MOUNJARO) 10 mg/0.5 mL PnIj, Inject 10 mg under the skin every seven (7) days., Disp: 2 mL, Rfl: 5    tretinoin (RETIN-A) 0.05 % cream, Apply a pea sized amount at night 2-3 times a week x 2 weeks, then every other night for 2 weeks, then nightly. If getting dry, use a facial moisturizer in the morning such as Oil of Olay complete sensitive skin or CereVe AM, Disp: 45 g, Rfl: 4    VENTOLIN HFA 90 mcg/actuation inhaler, Inhale 2 puffs every six (6) hours as needed for wheezing., Disp: 54 g, Rfl: 1     Allergies:   Allergies   Allergen Reactions    Iohexol Anaphylaxis      Desc: IVP DYE   OK WITH 13 HR PREP    Propoxyphene Hives     Uncoded Allergy. Allergen: SHELLFISH    Shellfish Containing Products Anaphylaxis     Patient is ok with topical betadine.    Sulfa (Sulfonamide Antibiotics) Hives    Topiramate Other (See Comments) Other Reaction: OTHER REACTION  Other Reaction: OTHER REACTION  Increase in HR       Pertinent Social Hx and Habits: EMR reviewed    Pertinent Family Hx: EMR reviewed    Objective:      BP Readings from Last 3 Encounters:   04/27/23 112/82   02/25/23 112/78   01/25/23 125/86        There were no vitals filed for this visit.     Physical Exam:    General Appearance: WDWN in NAD      Skin: W, D, I  HEENT: PERRLA, EOMI, TM's clear  Respiratory: Clear throughout  Cardio: RRR  Abdomen: Soft and non-tnder  Neurologic: A & O X 4, Grossly intact, stable gait  PSYCH: Behavior calm and cooperative    Diagnostics:     Lab Results   Component Value Date    WBC 8.2 04/22/2022    HGB 13.4 04/22/2022    HCT 38.1 04/22/2022    PLT 294 04/22/2022       Lab Results   Component Value Date    NA 138 02/25/2023    K 4.5 02/25/2023    CL 102 02/25/2023    CO2 23.0 02/25/2023    BUN 25 (H) 02/25/2023    CREATININE 0.85 02/25/2023    GLU 107 (H) 02/25/2023    CALCIUM 9.8 02/25/2023       Lab Results   Component Value Date    BILITOT 1.4 (H) 02/25/2023    PROT 7.8 02/25/2023    ALBUMIN 4.3 02/25/2023  ALT 32 02/25/2023    AST 24 02/25/2023    ALKPHOS 64 02/25/2023       No results found for: PT, INR, APTT     TSH   Date Value Ref Range Status   02/25/2023 2.769 0.550 - 4.780 uIU/mL Final   08/23/2022 1.817 0.550 - 4.780 uIU/mL Final        I attest that I, Benita Stabile, personally documented this note while acting as scribe for Olena Leatherwood, NP.      Benita Stabile, Scribe.  05/27/2023     The documentation recorded by the scribe accurately reflects the service I personally performed and the decisions made by me.     Olena Leatherwood, NP    Dr. Betha Loa, DNP, FNP-BC  [facility] Primary Care at [facility] Certified Doctor of Nursing Practice   [phone]

## 2023-06-07 NOTE — Unmapped (Signed)
 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   - Continue spironolactone to 200 mg daily (02/2023 K wnl, pt is menopausal).   - Continue oral minoxidil 1.25 mg daily. Risks: do not use in patients with CHF, valve disease, poorly controlled hypertension, pulmonary hypertension. Patient does not have a history of CHF or valve disease. She has well controlled essential hypertension (per documentation by PCP 08/2022)  - Continue finasteride (PROPECIA) 1 mg tablet; Take 1 tablet (1 mg total) by mouth daily.  Patient currently abstinent and has had hysterectomy. 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   - Start hydrocortisone 2.5 % ointment; Apply to affected area on ears twice daily until improved or for up to 2 weeks, whichever is sooner. Break for 1-2 weeks. Restart as needed.      Milia  - Benign, reassurance provided  - Reassured that lesion appears benign, presenting with no worrisome features on exam today  - Continue tretinoin 0.05% cream. Apply a pea-sized amount for the entire face on dry skin at every other night, increase to nightly as tolerated. Mix with moisturizer if too drying  - Instructed patient to contact us if prescription is cost-prohibitive so we can send the tretinoin to a compounding pharmacy   - The importance of sun protection and using OTC  broad spectrum, SPF 30 or higher sunscreen was reviewed.       The patient was advised to call for an appointment should any new, changing, or symptomatic lesions develop.     RTC: Return in about 6 months (around 12/10/2023). or sooner as needed _________________________________________________________________      Chief Complaint     Chief Complaint   Patient presents with    Alopecia     Pt coming in for Alopecia follow up       Rosacea     Pt coming in for Rosacea follow-up        HPI     Darlene Lucas is a 53 y.o. female who presents as a returning patient (last seen by Charlsie Quest, PA on  03/10/2023) to Dermatology for follow up of alopecia. At last visit, patient had a biopsy performed revealing the following results below. She was to continue tretinoin 0.05% cream for milia and continue spironolactone 200 mg daily, minoxidil 1.25 mg daily, and start finasteride 1 mg daily for alopecia.    Diagnosis   Date Value Ref Range Status   03/10/2023   Final    Left nasal ala, shave:  - Prominent telangiectasias.  - No malignancy is identified on multiple deeper sections examined.        Patient reports continued stability in hair growth compared to prior. She has not noticed any new shedding and has switched her products to add moisture to her hair. She continues to use tretinoin on her face and has seen improvement in her milia and the redness on her cheeks. Lastly, she endorses a several month long history of flaking and itchy of the ears. She uses vaseline on this daily.     The patient denies any other new or  changing lesions or areas of concern.     Pertinent Past Medical History     No history of skin cancer    Family History:   Negative for melanoma    Past Medical History, Family History, Social History, Medication List, Allergies, and Problem List were reviewed in the rooming section of Epic.     ROS: Other than symptoms mentioned in the HPI, no fevers, chills, or other skin complaints    Physical Examination     GENERAL: Well-appearing female in no acute distress, resting comfortably.  NEURO: Alert and oriented, answers questions appropriately  PSYCH: Normal mood and affect  SKIN: Examination of the hair, scalp, ears, face, and neck was performed  - Milium/milia: 1-109mm white to yellow papule(s) on the left cheek  - mild thinning of hair diffusely mainly centered on occipital scalp and part line  Improved from prior. Negative hair pull test  - erythematous patches of the bilateral conchal bowls       All areas not commented on are within normal limits or unremarkable      (Approved Template 04/23/2023)

## 2023-06-09 ENCOUNTER — Ambulatory Visit
Admit: 2023-06-09 | Discharge: 2023-06-10 | Payer: BLUE CROSS/BLUE SHIELD | Attending: Student in an Organized Health Care Education/Training Program | Primary: Student in an Organized Health Care Education/Training Program

## 2023-06-09 DIAGNOSIS — L219 Seborrheic dermatitis, unspecified: Principal | ICD-10-CM

## 2023-06-09 DIAGNOSIS — L720 Epidermal cyst: Principal | ICD-10-CM

## 2023-06-09 DIAGNOSIS — L658 Other specified nonscarring hair loss: Principal | ICD-10-CM

## 2023-06-09 MED ORDER — HYDROCORTISONE 2.5 % TOPICAL OINTMENT
1 refills | 0.00 days | Status: CP
Start: 2023-06-09 — End: ?

## 2023-06-09 MED ORDER — TRETINOIN 0.05 % TOPICAL CREAM
4 refills | 0.00 days | Status: CP
Start: 2023-06-09 — End: ?
  Filled 2023-06-30: qty 45, 30d supply, fill #0

## 2023-06-09 MED ORDER — FINASTERIDE 1 MG TABLET
ORAL_TABLET | Freq: Every day | ORAL | 3 refills | 90.00 days | Status: CP
Start: 2023-06-09 — End: 2024-06-08
  Filled 2023-06-30: qty 90, 90d supply, fill #0

## 2023-06-09 MED ORDER — MINOXIDIL 2.5 MG TABLET
ORAL_TABLET | Freq: Every day | ORAL | 3 refills | 90.00 days | Status: CP
Start: 2023-06-09 — End: 2024-06-08
  Filled 2023-06-30: qty 45, 90d supply, fill #0

## 2023-06-09 MED ORDER — VENTOLIN HFA 90 MCG/ACTUATION AEROSOL INHALER
Freq: Four times a day (QID) | RESPIRATORY_TRACT | 1 refills | 75.00 days | Status: CN | PRN
Start: 2023-06-09 — End: 2024-06-08

## 2023-06-09 NOTE — Unmapped (Addendum)
 Meet your team:     Your intake nurse is: Conservator, museum/gallery    Please remember to fill out the survey you will receive after your visit. Your comments help [place] continue to improve our care.      Thanks in advance!      [facility] Dermatology Clinical Staff     What should I look for in a sunscreen?  Blocks both UVA and UVB rays (Broad Spectrum)  Sunscreen of SPF 30 or higher  Physical blockers with titanium dioxide or zinc oxide are great at blocking UV rays and are less likely to cause a skin allergy  Water resistant for up to either 40 or 80 minutes.  Sunscreens are not waterproof or sweatproof.  Recommended brands include, but are not limited to:  Vanicream   Blue Lizard  Neutrogena  Aveeno  L'Oreal  Elta MD and Michaelle Birks are great, but are much more expensive and do not necessarily work better.      When should I use my sunscreen?  Apply sunscreen every day if you will be outside for longer than 10-15 minutes.  UVA rays (cause aging, not burning) penetrate through clouds and windows, including car windows.  We recommend using a lotion with broad spectrum SPF 30 on a daily basis to the face.  Recommended brands include, but are not limited to:  Neutrogena face lotion w/ SPF 30  Eucerin face lotion w/ SPF 30  Aveeno face lotion w/ SPF 30  Cetaphil face lotion w/ SPF 30  Oil of Olay face lotion w/ SPF 30  Elta MD and La Roche-Posay are great, but are much more expensive and do not necessarily work better.      How do I use my sunscreen?  Apply sunscreen liberally.  A good rule of thumb is to use one ounce (enough to fill a shot glass) for the body.  This may change depending on your body size though.    Apply 15 minutes before going outside.  Reapply sunscreen at least every 2 hours, especially after swimming or heavy sweating.  No sunscreens are sweatproof or waterproof.  Remember to protect your lips with a lip balm that contains sunscreen of at least SPF 30.    Are there options besides sunscreen?  Of course!  Avoidance measures:  Avoid peak hours between 10 am to 4 pm, when the UV rays are the strongest.  Limit exposure to reflective surfaces such as water, sand, and snow as UV rays can be reflected from these surfaces.  Protective measures:  Wear a wide-brimmed hat with at least 3 inch brim.  Sun protective clothing is an excellent option.  With clothing, the SPF is called UPF.  Look for a UPF of 30 to 50+.  Recommended brands include: Lenna Gilford, and Sunday Afternoons.  Check out sporting good stores such as [employer] and [employer] as well.    Is there a safe way to tan?  Tanning booths are NOT safer than sun exposure.  They damage skin just as real sunlight does and lead to wrinkling and skin cancer.  Consider sunless tanning products such as Jergens or L'Oreal.  Remember the tan produced by these products does not actually protect you from the sun so continue to apply your sunscreen regularly.

## 2023-06-10 ENCOUNTER — Ambulatory Visit: Admit: 2023-06-10 | Discharge: 2023-06-11 | Payer: BLUE CROSS/BLUE SHIELD | Attending: Family | Primary: Family

## 2023-06-10 NOTE — Unmapped (Signed)
 Plan:You received a corticosteroid injection to reduce pain and inflammation.  Please note that it can take up to 2 weeks for this injection to fully work.  While many people will feel relief sooner, please be patient.      The injection contained a corticosteroid and a numbing agent.  The numbing agent can last for 1-6 hours.  After this wears off you may have increased pain until the steroid has a chance to work.    What are some of the possible side effects of a steroid injection?    Common side effects:  temporarily elevated blood sugar (in diabetic patients) that can last a few days   flushing of the skin, especially the face  temporary rise in blood pressure  discoloration or atrophy of the skin at the injection site    Call your doctor at once if you have:  persistent worsening pain or swelling, fever;  blurred vision, tunnel vision, eye pain, or seeing halos around lights;  fast or slow heartbeats;  increased blood pressure that is associated with severe headache, blurred vision, pounding in your neck or ears, anxiety, nosebleed;  headaches, ringing in your ears, dizziness, nausea, vision problems, pain behind your eyes    This is not a complete list of side effects and others may occur. Call your provider for medical advice about side effects.     What other drugs may be affected after the injection?  Many drugs can interact with steroids. Not all possible interactions are listed here. Tell your doctor about all your current medicines and any you start or stop using, especially:  an antibiotic or antifungal medication;  birth control pills or hormone replacement therapy;  a blood thinner (warfarin, Coumadin, and others);  a diuretic or water pill;  insulin or oral diabetes medicine;  medicine to treat tuberculosis;  a nonsteroidal anti-inflammatory drug or NSAID (aspirin, ibuprofen, naproxen, diclofenac, indomethacin, Advil, Aleve, Celebrex, and many others); or  seizure medication.      You can resume your normal daily activities, but consider resting the injected area for the next few days.          Thank you for coming to [facility] and our clinic today!     We aim to provide you with the highest quality, individualized care.  If you have any unanswered questions after the visit, please do not hesitate to reach out to [place] on MyChart or leave a message for the nurse.  ?  MyChart messages: These messages can be sent to your provider and will be checked by their clinical support staff.? The messages are checked throughout the day during normal business hours from 8:30 am-4:00 pm Monday-Friday, however responses may take up to 48 hours.? Please use this method of communication for non-urgent and non-emergent concerns, questions, refill requests or inquiries only.? ?Our team will help respond to all of your questions.? Please note that you may be asked to see a provider by either a telehealth or in person visit if it is deemed your questions are best handled in the clinic setting in person.??  ?  Please keep in mind, these messages are not real time communications, so be patient when waiting for a response.    If you do not have access to MyChart, do not know how to use MyChart or have an issue that may require more extensive discussion, please call the nurses' call line: [phone].? This line is checked throughout the day and will be  responded to as time allows.? Please note that return calls could take up to 48 hours, depending on the nature of the need.?  ?  If you have an issue that requires emergent attention that cannot wait; either call the Orthopaedics resident on call at [phone], consider coming to our [facility] walk-in clinic, or go to the nearest Emergency Department.    If you need to schedule future appointments, please call [phone].     We look forward to seeing you again in the future and appreciate you choosing [facility] for your care!    Thank you,                We provide innovative and comprehensive patient centered care that is supported by evidence-based research                                                                                                    RESEARCH PARTICIPATION    Please check out our current research studies to see if you or someone you know may qualify at:    [email or web address]

## 2023-06-10 NOTE — Unmapped (Signed)
 SPORTS MEDICINE RETURN VISIT    ASSESSMENT AND PLAN      Diagnosis ICD-10-CM Associated Orders   1. Primary osteoarthritis of both knees  M17.0 Lg Joint Inj: bilateral knee           Unfortunately, her insurance does not cover viscosupplementation injection and no operative intervention is recommended.  She will proceed with intra-articular steroid injection today.    Return if symptoms worsen or fail to improve.    Procedure(s):  Intra-articular steroid injections      SUBJECTIVE     Chief Complaint: No chief complaint on file.      History of Present Illness: 53 y.o. female who presents for bilateral knee pain, requesting intra-articular steroid injections.  Steroid injections in knees were 06/29/2022 with Dr. Jacqlyn Krauss.  No interval injury.    Past Medical History:   Past Medical History:   Diagnosis Date    Abnormal Pap smear of cervix     Abnormal uterine bleeding     Was told possible adenomyosis, possible need for hysterectomy by Dr. Alvino Chapel. Had [place], and trial of 90 days of provera. 11/29/13 TVUS 5x6x4cm RO 2x1.5x1.5 LO 2x2x 1.6, EMS 5mm, abundant flow near endometrium c/w possible adenomyosis     Allergic Age 49    Amblyopia 1974    Anemia     Ankylosing spondylitis of site in spine (CMS-HCC) 2005    Arthritis     Asthma     Blepharitis November 2021    BMI 50.0-59.9, adult (CMS-HCC)     Bursitis 2007    Multiple areas    Chronic back pain     Chronic kidney disease     Constipation     Deep vein thrombosis (CMS-HCC)     Diabetes mellitus (CMS-HCC) 2020    Difficult intravenous access     Dry eyes     Dysplasia of cervix, high grade CIN 2     Edema     Elevated LFTs     Environmental allergies     Eye trauma 2020    fractured orbit. Unsure which eye.    Gait abnormality 2003    Uses of a cane as needed    GERD (gastroesophageal reflux disease)     Goiter 2015    monitoring levels    Gout 2019    Headache     Heart murmur 2018    Hyperlipidemia     Hypertension 2021    Hypokalemia     Kidney stone started 1986 [id number]    Osteoarthritis 2003    Osteoporosis     Prediabetes     Pulmonary embolism (CMS-HCC) 1989, 1992    following miscarriages     Recurrent UTI     Right leg pain 10/2017    right fibula fracture    Scoliosis 1974    Spondylolisthesis     Thyroid function study abnormality 2015    seeing endocrine (TSH 5.37 12/25/13, T4 1.02         OBJECTIVE     Physical Exam:  Vitals:   Wt Readings from Last 3 Encounters:   04/27/23 64.1 kg (141 lb 6.4 oz)   04/06/23 66.2 kg (146 lb)   02/25/23 66.5 kg (146 lb 8 oz)     Estimated body mass index is 26.72 kg/m?? as calculated from the following:    Height as of 04/06/23: 154.9 cm (5' 1).    Weight as of 04/27/23: 64.1 kg (141 lb 6.4 oz).  Gen: Well-appearing female in no acute distress  MSK: Gait is slow but steady.  No obvious effusion bilateral knees.  Tenderness to palpation medial joint lines bilaterally.  No pain throughout arc of range of motion.  Skin is warm, dry and intact without excessive warmth, erythema or ecchymosis.    Imaging/other tests: No new x-rays taken today.  X-rays dated 06/04/2022 show:  1. Unchanged early patellofemoral predominant tricompartmental osteoarthrosis.   2. Small right knee effusion.   3. Right knee chondrocalcinosis.       ADMINISTRATIVE     I have personally reviewed and interpreted the images (as available).  Point-of-care ultrasound imaging is on file and stored in a permanent location (if performed).  I have personally reviewed prior records and incorporated relevant information above (as available).    @SMIBILLING @    PROCEDURES     Lg Joint Inj: bilateral knee on 06/10/2023 10:00 AM  Indications: pain  Details: 22 G needle, anterolateral approach  Laterality: bilateral  Location: knee  Medications (Right): 20 mg triamcinolone acetonide 10 mg/mL  Medications (Left): 20 mg triamcinolone acetonide 10 mg/mL  Outcome: tolerated well, no immediate complications  Procedure, treatment alternatives, risks and benefits explained, specific risks discussed. Consent was given by the patient. Immediately prior to procedure a time out was called to verify the correct patient, procedure, equipment, support staff and site/side marked as required. Patient was prepped and draped in the usual sterile fashion.     Medical Care Team Attestation: All ProcDoc orders were read back and verbally confirmed with the procedure provider, including but not limited to patient name, medication name, dose, and route, before any actions were taken.  Provider Attestation: The information documented by members of my medical care team was reviewed and verified for accuracy by me.           DME     DME ORDER:  Dx:  ,

## 2023-06-27 DIAGNOSIS — L658 Other specified nonscarring hair loss: Principal | ICD-10-CM

## 2023-06-27 MED ORDER — SPIRONOLACTONE 100 MG TABLET
ORAL_TABLET | Freq: Two times a day (BID) | ORAL | 0 refills | 90.00 days | Status: CP
Start: 2023-06-27 — End: 2023-12-24
  Filled 2023-07-29: qty 180, 90d supply, fill #0

## 2023-06-27 NOTE — Unmapped (Signed)
 Rx for Spironolactone is pended for you to review and sign, patient was last seen in clinic on 06/09/2023.

## 2023-06-27 NOTE — Unmapped (Signed)
 [facility] Refill Coordination Note    Specialty Lite Medication(s) to be Shipped:   Mounjaro    Other medication(s) to be shipped:  fluticasone propionate 50 mcg/actuation nasal spray (FLONASE), RETIN-A 0.05 % cream (tretinoin)minoxidil 2.5 MG tablet (LONITEN)finasteride 1 mg tablet (PROPECIA)montelukast 10 mg tablet (SINGULAIR)spironolactone 100 MG tablet (ALDACTONE)     Darlene Lucas, DOB: Jun 10, 1970  Phone: There are no phone numbers on file.      All above HIPAA information was verified with patient.     Was a Nurse, learning disability used for this call? No    Changes to medications: Darlene Lucas reports no changes at this time.  Changes to insurance: No      REFERRAL TO PHARMACIST     Referral to the pharmacist: Not needed      [facility]     Shipping address confirmed in Epic.     Cost and Payment: Patient has a copay of $44.50. They are aware and have authorized the pharmacy to charge the credit card on file.    Delivery Scheduled: Yes, Expected medication delivery date: 06/30/23.  However, Rx request for refills was sent to the provider as there are none remaining.     Medication will be delivered via Same Day Courier to the prescription address in Epic WAM.    Darlene Lucas   [facility] Specialty Technician

## 2023-06-30 MED FILL — FLUTICASONE PROPIONATE 50 MCG/ACTUATION NASAL SPRAY,SUSPENSION: NASAL | 60 days supply | Qty: 16 | Fill #1

## 2023-06-30 MED FILL — MOUNJARO 10 MG/0.5 ML SUBCUTANEOUS PEN INJECTOR: SUBCUTANEOUS | 28 days supply | Qty: 2 | Fill #1

## 2023-06-30 MED FILL — MONTELUKAST 10 MG TABLET: ORAL | 90 days supply | Qty: 90 | Fill #3

## 2023-07-05 ENCOUNTER — Inpatient Hospital Stay
Admit: 2023-07-05 | Discharge: 2023-07-06 | Payer: Medicaid (Managed Care) | Attending: Physical Medicine & Rehabilitation | Primary: Physical Medicine & Rehabilitation

## 2023-07-05 DIAGNOSIS — M5412 Radiculopathy, cervical region: Principal | ICD-10-CM

## 2023-07-05 DIAGNOSIS — G8929 Other chronic pain: Principal | ICD-10-CM

## 2023-07-05 DIAGNOSIS — Z981 Arthrodesis status: Principal | ICD-10-CM

## 2023-07-05 DIAGNOSIS — M533 Sacrococcygeal disorders, not elsewhere classified: Principal | ICD-10-CM

## 2023-07-05 NOTE — Unmapped (Signed)
 [facility]  Clinical Neurophysiology Laboratory  [place], [place]         Patient: Darlene Lucas Sex: Female  Mc[place] Chapel #: [id number] Date of Birth: [birth year]      Visit Date: 07/05/2023 8:38 AM  Age: 53 Years  Performing MD: Jhonnie Mosher, MD   Ref Provider: Jhonnie Mosher, MD   Tech: Bohdan Bush   Room: 4   Height: 5 feet 1 inch    History: [name] presents for bilateral radicular pain in the upper limbs. Cervical xray demonstrates multilevel spondylosis.  Right digit abduction is weak on the right.      Sensory NCS      Nerve / Sites Rec. Site Peak Lat Ref. PP Amp Ref. Distance Vel.     ms ms ??V ??V cm m/s   R Median - Dig II (Antidromic)      Wrist Index 3.71 <=3.80 43.5 >=10.0 14 49   L Median - Dig II (Antidromic)      Wrist Index 3.21 <=3.80 58.9 >=10.0 14 62   R Median - Mixed      Palm-Wrist Wrist 2.33 <=2.20 108.2 >=40.0 8 44   R Ulnar - Dig V (Antidromic)      Wrist Dig V 3.58 <=3.80 12.8 >=10.0 14 53   L Ulnar - Dig V (Antidromic)      Wrist Dig V 3.46 <=3.80 38.3 >=10.0 14 56   R Ulnar - Mixed      Palm-Wrist Wrist 2.08 <=2.20 24.0 >=20.0 8 71   R Radial - Superficial (Antidromic)      Forearm Wrist 2.27 <=2.80 58.8  10 59       Motor NCS      Nerve / Sites Muscle Latency Ref. Amplitude Ref. Dur. Distance Lat Diff Velocity Ref.     ms ms mV mV ms cm ms m/s m/s   R Median - APB      Wrist APB 3.54 <=4.40 13.6 >=4.2 5.52 8         Elbow APB 7.25  13.3  7.04 21 3.71 56.6 >=49.0   L Median - APB      Wrist APB 3.15 <=4.40 12.4 >=4.2 5.40 8         Elbow APB 7.00  11.7  6.79 19 3.85 49.3 >=49.0   R Ulnar - ADM      Wrist ADM 3.19 <=3.70 12.8 >=5.6 6.56 8         B.Elbow ADM 5.88  10.4  6.81 16 2.69 59.5 >=49.0      A.Elbow ADM 9.19  10.2  7.27 13 3.31 39.2 >=49.0   L Ulnar - ADM      Wrist ADM 3.31 <=3.70 12.2 >=5.6 6.58 8         B.Elbow ADM 6.06  11.9  6.75 15 2.75 54.5 >=49.0      A.Elbow ADM 7.52  11.7  6.81 9 1.46 61.7 >=49.0       F  Wave      Nerve F min Ref.    ms ms   R Median - APB 26.3 <=31.0   R Ulnar - ADM 28.4 <=31.0   L Ulnar - ADM 26.4 <=31.0       EMG Summary Table     Spontaneous MUP Recruitment Comment   Muscle Nerve Roots Fib PSW Fasc Other Amp Dur. PPP Pattern Other   R. First dorsal interosseous Ulnar C8-T1 None None None None 2+ 2+ 1+  Reduced None   R. Abductor pollicis brevis Median C8-T1 None None None None 2+ N N Reduced None   R. Extensor digitorum communis Radial C7-C8 None None None None N N N Normal None   R. Pronator teres Median C6-C7 None None None None N N N Normal None   R. Triceps brachii Radial C6-C8 None None None None N 2+ N Normal None   R. Deltoid Axillary C5-C6 None None None None N N N Normal None   R. Cervical paraspinals Spinal C4-C8 None None None None            Summary:  After identifying the patient in the waiting room and reviewing all appropriately available medical records, the patient was taken back to the examination room where the procedure was explained, the sites of examination were noted, the patient's questions were answered, and the patient's verbal consent for the procedure was obtained. Studies were performed at [facility] using a radiant warmer and Synergy EMG system.    Bilateral median sensory NCS are normal.    Right mixed palmar comparison studies do not show a prolonged median versus ulnar latency.    Right ulnar sensory NCS demonstrates normal latency and reduced amplitude when compared to the opposite side.    Left ulnar sensory NCS is normal.     Right radial sensory NCS is normal.    Bilateral median motor NCS are normal.    Right ulnar motor NCS demonstrates normal latency, normal amplitude and slowing of the conduction velocity across the elbow.    Left ulnar motor NCS is normal.    EMG was performed in the right upper limb.  No muscle demonstrates evidence of active denervation.  Chronic reinnervation is noted in the FDI, APB and triceps.     Sonographic evaluation was performed in the right upper limb. Images are stored in a permanent location.    Right ulnar nerve:  --42mm2 below elbow  --37mm2 cubital tunnel entrance  --58mm2 epicondyle (enlarged), anechoic  --34mm2 above elbow  There is partial subluxation with elbow flexion.    Right median nerve:  --19mm2 forearm  --73mm2 wrist  Mildly reduced echogenicity at wrist.   Normal morphology otherwise.  Wrist to forearm ratio 2:1 which is abnormal.    Conclusions:  This is a normal study.    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 Sonographic findings support.    --Right chronic lower cervical radiculopathy without active denervation.      There is sonographic evidence, only, of a right median mononeuropathy at the wrist (CTS).        - - - - - - - - - - - -Earleen Aoun, MD  Attending Electromyographer

## 2023-07-08 ENCOUNTER — Ambulatory Visit: Admit: 2023-07-08 | Discharge: 2023-07-09 | Payer: Medicaid (Managed Care) | Attending: Family | Primary: Family

## 2023-07-08 DIAGNOSIS — M1990 Unspecified osteoarthritis, unspecified site: Principal | ICD-10-CM

## 2023-07-08 DIAGNOSIS — S025XXA Fracture of tooth (traumatic), initial encounter for closed fracture: Principal | ICD-10-CM

## 2023-07-08 DIAGNOSIS — Z0000 Encounter for general adult medical examination without abnormal findings: Principal | ICD-10-CM

## 2023-07-08 MED ORDER — TRAMADOL 50 MG TABLET
ORAL_TABLET | Freq: Four times a day (QID) | ORAL | 0 refills | 10.00 days | Status: CP | PRN
Start: 2023-07-08 — End: 2023-07-08

## 2023-07-08 MED ORDER — CELECOXIB 200 MG CAPSULE
ORAL_CAPSULE | Freq: Two times a day (BID) | ORAL | 3 refills | 90.00 days | Status: CP
Start: 2023-07-08 — End: 2024-07-07
  Filled 2023-07-29: qty 180, 90d supply, fill #0

## 2023-07-08 NOTE — Unmapped (Signed)
 Copied from CRM [id number]. Topic: Other - Other  >> Jul 08, 2023 12:46 PM Dayla Eva wrote:  Patient needs her traMADol  (ULTRAM ) 50 mg tablet needs to go to the     [id number] [place], [place] - [address]   Phone: [phone]  Fax: [phone]       The patient preferred contact: Cell Phone. Urgent callback turnaround time: within 24 business hours. Programmer, systems Notified)

## 2023-07-08 NOTE — Unmapped (Signed)
 [facility] Primary Care at [facility] Note:  [place], [place] [address]. Phone [phone]    07/09/2023    Patient Name:   Darlene Lucas    MRN: [id number]    Demographics:    Age-  53 y.o.     Date of Birth-  Aug 06, 1970    Chief complaint (CC):    Chief Complaint   Patient presents with    Annual Exam       Assessment/Plan:    Sher is a pleasant 53 y.o. female with a PMHx of GERD, Goiter, hypothyroidism, allergic rhinitis, HTN, PE, ulnar neuropathy, primary osteoarthritis of both knees, cervical neoplasia grade 2, prediabetes, HLD, obesity, chronic pain syndrome. Followed by [facility] (Dr. Marjory Signs), [facility] Weight Management (Dr. Alexia Angelucci), [facility] PM&R (Dr. Adin Honour), [facility] D[facility] (Dr. Lord Rod), [facility] (Dr. Amanda Jungling), [facility] Ophthalmology (Dr. Nancy Axon), [facility] Endocrinology (Dr. Harman Lightning). Here for medication management, recommendations as below.     BP well controlled Advised patient continue spironolactone  100 mg bid and pursuit of weight reduction. DASH diet, regular physical activity have been encouraged. Follow up in 3-6 months, sooner prn.     Noted elevations in total cholesterol (240) and LDL cholesterol (145) per last FLP 02/25/23. Reviewed results with patient, advised continued compliance with pravastatin  40 mg nightly and pursuit of lifestyle modifications for management including heart-healthy diet, regular physical activity, weight reduction. Will plan to obtain repeat fasting labs in 3-6 months, sooner prn.     Hx of prediabetes, glucose mildly elevated at 107 per [facility] 02/25/23. Patient denies any fatigue, GI changes, or side effects to current medication regimen. Encouraged continued compliance with Mounjaro  10 mg weekly, metformin  2000 mg daily, low-sugar diet. Will plan to obtain updated A1c today and follow up regarding results once I receive these.      Diagnosis ICD-10-CM Associated Orders   1. General medical exam  Z00.00       2. Closed fracture of tooth, initial encounter  S02.5XXA Ambulatory referral to Dentistry     traMADol  (ULTRAM ) 50 mg tablet      3. Arthritis  M19.90 celecoxib  (CELEBREX ) 200 MG capsule     traMADol  (ULTRAM ) 50 mg tablet            I personally spent 30 minutes face-to-face and non-face-to-face in the care of this patient, which includes all pre, intra, and post visit time on the date of service.  All documented time was specific to the E/M visit and does not include any procedures that may have been performed. We discussed medical, dietary, lifestyle, and health maintenance modifications to optimize health. Standard precautions followed during visit. Medication adherence and barriers to the treatment plan have been addressed.  Patient voiced understanding, needs F/U visit 3-6 months.     Health Maintenance:   Health Maintenance Due   Topic Date Due    Urine Albumin/Creatinine Ratio  Never done    COVID-19 Vaccine (3 - 2024-25 season) 11/21/2022    Pneumococcal Vaccine 50+ (2 of 2 - PCV) 12/04/2022    Hemoglobin A1c  02/22/2023     PHQ-2 Score:      PHQ-9 Score: 2    Subjective:    History of present illness (HPI):       ZAN TRISKA is a 53 y.o. female who presented to [facility] for general exam and medication management. Patient's medical history was reviewed, see Past Medical History. Medications used in the past were reviewed, see medications. Patient reports eating and eliminating well.  Pt is sleeping well. Patient has not required emergency room treatment for these symptoms, and has not required hospitalization.     Denies HA, fever, chest pain, shortness of breath, N/V/D, bowel or bladder issues, vision or hearing changes, or swelling.     Relevant ROS: Reviewed 12 systems, positive findings listed, all others negative.    Pertinent Past Med Hx:    Past Medical History:   Diagnosis Date    Abnormal Pap smear of cervix     Abnormal uterine bleeding     Was told possible adenomyosis, possible need for hysterectomy by Dr. Wendall Halls. Had US , and trial of 90 days of provera. 11/29/13 TVUS 5x6x4cm RO 2x1.5x1.5 LO 2x2x 1.6, EMS 5mm, abundant flow near endometrium c/w possible adenomyosis     Allergic Age 31    Amblyopia 1974    Anemia     Ankylosing spondylitis of site in spine 2005    Arthritis     Asthma     Blepharitis November 2021    BMI 50.0-59.9, adult (CMS-HCC)     Bursitis 2007    Multiple areas    Chronic back pain     Chronic kidney disease     Constipation     Deep vein thrombosis     Diabetes mellitus 2020    Difficult intravenous access     Dry eyes     Dysplasia of cervix, high grade CIN 2     Edema     Elevated LFTs     Environmental allergies     Eye trauma 2020    fractured orbit. Unsure which eye.    Gait abnormality 2003    Uses of a cane as needed    GERD (gastroesophageal reflux disease)     Goiter 2015    monitoring levels    Gout 2019    Headache     Heart murmur 2018    Hyperlipidemia     Hypertension 2021    Hypokalemia     Kidney stone started 1986    [id number]    Osteoarthritis 2003    Osteoporosis     Prediabetes     Pulmonary embolism 1989, 1992    following miscarriages     Recurrent UTI     Right leg pain 10/2017    right fibula fracture    Scoliosis 1974    Spondylolisthesis     Thyroid function study abnormality 2015    seeing endocrine (TSH 5.37 12/25/13, T4 1.02       Medications:       Current Outpatient Medications:     blood sugar diagnostic (ACCU-CHEK GUIDE TEST STRIPS) Strp, Use to check blood sugar once daily as directed, Disp: 100 strip, Rfl: 3    calcium  citrate-vitamin D3 (CITRACAL + D MAXIMUM) 315 mg-6.25 mcg (250 unit) per tablet, Take 1 tablet by mouth Two (2) times a day., Disp: 60 tablet, Rfl: 1    chlorhexidine (PERIDEX) 0.12 % solution, RINSE WITH CAPFUL ONCE DAILY THEN SPIT. DO NOT RINSE, EAT, OR DRINK 30 MINUTES AFTER USE, Disp: , Rfl:     conjugated estrogens  (PREMARIN ) 0.625 mg/gram vaginal cream, Insert 0.5 g into the vagina daily., Disp: 30 g, Rfl: 11    diclofenac  sodium 1.5 % Drop, Apply 40 drops topically four (4) times a day as needed for pain, Disp: 150 mL, Rfl: 2    estrogens , conjugated, (PREMARIN ) 0.3 MG tablet, Take 1 tablet (0.3 mg total) by mouth daily., Disp: 90 tablet,  Rfl: 1    finasteride (PROPECIA) 1 mg tablet, Take 1 tablet (1 mg total) by mouth daily., Disp: 90 tablet, Rfl: 3    fluticasone propionate (FLONASE) 50 mcg/actuation nasal spray, Use 1 spray in each nostril once daily., Disp: 16 g, Rfl: 3    hydrocortisone 2.5 % ointment, Apply to affected area on ears twice daily until improved or for up to 2 weeks, whichever is sooner. Break for 1-2 weeks. Restart as needed., Disp: 30 g, Rfl: 1    lancets (ACCU-CHEK SOFTCLIX LANCETS) Misc, Use to check blood sugar once daily as directed, Disp: 100 each, Rfl: 3    levothyroxine (SYNTHROID) 50 MCG tablet, Take 1 tablet (50 mcg total) by mouth daily., Disp: 90 tablet, Rfl: 1    metFORMIN (GLUCOPHAGE-XR) 500 MG 24 hr tablet, Take 2 tablets (1,000 mg total) by mouth daily with evening meal., Disp: 120 tablet, Rfl: 5    minoxidil (LONITEN) 2.5 MG tablet, Take 1/2 tablet (1.25 mg total) by mouth daily., Disp: 45 tablet, Rfl: 3    montelukast (SINGULAIR) 10 mg tablet, Take 1 tablet (10 mg total) by mouth daily., Disp: 90 tablet, Rfl: 3    pravastatin (PRAVACHOL) 40 MG tablet, Take 1 tablet (40 mg total) by mouth nightly., Disp: 90 tablet, Rfl: 1    spironolactone (ALDACTONE) 100 MG tablet, Take 1 tablet (100 mg total) by mouth two (2) times a day., Disp: 180 tablet, Rfl: 0    tirzepatide (MOUNJARO) 10 mg/0.5 mL PnIj, Inject 10 mg under the skin every seven (7) days., Disp: 2 mL, Rfl: 5    tretinoin (RETIN-A) 0.05 % cream, Apply a pea sized amount at night 2-3 times a week x 2 weeks, then every other night for 2 weeks, then nightly. If getting dry, use a facial moisturizer in the morning such as Oil of Olay complete sensitive skin or CereVe AM, Disp: 45 g, Rfl: 4    VENTOLIN HFA 90 mcg/actuation inhaler, Inhale 2 puffs every six (6) hours as needed for wheezing., Disp: 54 g, Rfl: 1    aspirin 81 MG chewable tablet, Chew 1 tablet (81 mg total) daily., Disp: 30 tablet, Rfl: 0    celecoxib (CELEBREX) 200 MG capsule, Take 1 capsule (200 mg total) by mouth two (2) times a day. As needed for pain., Disp: 180 capsule, Rfl: 3    traMADol (ULTRAM) 50 mg tablet, Take 1 tablet (50 mg total) by mouth every six (6) hours as needed for pain., Disp: 40 tablet, Rfl: 0     Allergies:   Allergies   Allergen Reactions    Iohexol Anaphylaxis      Desc: IVP DYE   OK WITH 13 HR PREP    Propoxyphene Hives     Uncoded Allergy. Allergen: SHELLFISH    Shellfish Containing Products Anaphylaxis     Patient is ok with topical betadine.    Sulfa (Sulfonamide Antibiotics) Hives    Topiramate Other (See Comments)     Other Reaction: OTHER REACTION  Other Reaction: OTHER REACTION  Increase in HR    Amoxicillin Rash       Pertinent Social Hx and Habits: EMR reviewed    Pertinent Family Hx: EMR reviewed    Objective:      BP Readings from Last 3 Encounters:   07/08/23 110/70   04/27/23 112/82   02/25/23 112/78        Vitals:    07/08/23 1036   BP: 110/70   Pulse: 110  SpO2: 98%   Weight: 60.8 kg (134 lb)   Height: 154.9 cm (5' 1)        Physical Exam:    General Appearance: WDWN in NAD      Skin: W, D, I  HEENT: PERRLA, EOMI  Respiratory: Clear throughout  Cardio: RRR  Abdomen: Soft and non-tnder  Neurologic: A & O X 4, Grossly intact, stable gait  PSYCH: Behavior calm and cooperative    Diagnostics:     Lab Results   Component Value Date    WBC 8.2 04/22/2022    HGB 13.4 04/22/2022    HCT 38.1 04/22/2022    PLT 294 04/22/2022       Lab Results   Component Value Date    NA 138 02/25/2023    K 4.5 02/25/2023    CL 102 02/25/2023    CO2 23.0 02/25/2023    BUN 25 (H) 02/25/2023    CREATININE 0.85 02/25/2023    GLU 107 (H) 02/25/2023    CALCIUM  9.8 02/25/2023       Lab Results   Component Value Date    BILITOT 1.4 (H) 02/25/2023    PROT 7.8 02/25/2023    ALBUMIN 4.3 02/25/2023    ALT 32 02/25/2023    AST 24 02/25/2023    ALKPHOS 64 02/25/2023 No results found for: PT, INR, APTT     TSH   Date Value Ref Range Status   02/25/2023 2.769 0.550 - 4.780 uIU/mL Final   08/23/2022 1.817 0.550 - 4.780 uIU/mL Final        I attest that I, Alray Jenny, personally documented this note while acting as scribe for Mai Schwalbe, NP.      Alray Jenny, Scribe.  07/08/2023     The documentation recorded by the scribe accurately reflects the service I personally performed and the decisions made by me.     Mai Schwalbe, NP    Dr. Curlie Doughty, DNP, FNP-BC  [facility] Primary Care at [facility] Certified Doctor of Nursing Practice   [phone]

## 2023-07-18 ENCOUNTER — Inpatient Hospital Stay: Admit: 2023-07-18 | Discharge: 2023-07-19 | Payer: Medicaid (Managed Care)

## 2023-07-25 DIAGNOSIS — E039 Hypothyroidism, unspecified: Principal | ICD-10-CM

## 2023-07-25 DIAGNOSIS — N951 Menopausal and female climacteric states: Principal | ICD-10-CM

## 2023-07-25 MED ORDER — MOUNJARO 7.5 MG/0.5 ML SUBCUTANEOUS PEN INJECTOR
SUBCUTANEOUS | 5 refills | 0.00000 days | Status: CP
Start: 2023-07-25 — End: ?
  Filled 2023-07-29: qty 2, 28d supply, fill #0

## 2023-07-25 MED ORDER — CONJUGATED ESTROGENS 0.3 MG TABLET
ORAL_TABLET | Freq: Every day | ORAL | 1 refills | 90.00000 days
Start: 2023-07-25 — End: 2024-07-24

## 2023-07-25 MED ORDER — LEVOTHYROXINE 50 MCG TABLET
ORAL_TABLET | Freq: Every day | ORAL | 1 refills | 90.00000 days | Status: CP
Start: 2023-07-25 — End: 2024-07-24
  Filled 2023-07-29: qty 90, 90d supply, fill #0

## 2023-07-25 NOTE — Unmapped (Signed)
 Patient is requesting the following refill  Requested Prescriptions     Pending Prescriptions Disp Refills    estrogens, conjugated, (PREMARIN) 0.3 MG tablet 90 tablet 1     Sig: Take 1 tablet (0.3 mg total) by mouth daily.     Signed Prescriptions Disp Refills    levothyroxine (SYNTHROID) 50 MCG tablet 90 tablet 1     Sig: Take 1 tablet (50 mcg total) by mouth daily.     Authorizing Provider: Lawton Price     Ordering User: Mitchel An       Recent Visits  Date Type Provider Dept   07/08/23 Office Visit Lawton Price, NP [place] [facility] [address] [facility]   04/12/23 Telemedicine Farrug, Madolyn Schlatter, NP [place] [facility] [address] At [facility]   03/04/23 Telemedicine Baldwin Bones, FNP [place] [facility] [address] At [facility]   02/25/23 Office Visit Bettyjo Brunette, Madolyn Schlatter, NP [place] [facility] [address] At [facility]   02/09/23 Telemedicine Farrug, Madolyn Schlatter, NP [place] [facility] [address] At [facility]   08/23/22 Office Visit Farrug, Madolyn Schlatter, NP [place] [facility] [address] At [facility]   Showing recent visits within past 365 days and meeting all other requirements  Future Appointments  Date Type Provider Dept   10/07/23 Appointment Lawton Price, NP [place] [facility] [address] At [facility]   Showing future appointments within next 365 days and meeting all other requirements       Labs: Not applicable this refill

## 2023-07-26 MED ORDER — CONJUGATED ESTROGENS 0.3 MG TABLET
ORAL_TABLET | Freq: Every day | ORAL | 1 refills | 90.00000 days | Status: CP
Start: 2023-07-26 — End: 2024-07-25
  Filled 2023-08-01: qty 90, 90d supply, fill #0

## 2023-07-29 MED FILL — VENTOLIN HFA 90 MCG/ACTUATION AEROSOL INHALER: RESPIRATORY_TRACT | 75 days supply | Qty: 54 | Fill #1

## 2023-07-29 MED FILL — METFORMIN ER 500 MG TABLET,EXTENDED RELEASE 24 HR: ORAL | 90 days supply | Qty: 180 | Fill #0

## 2023-08-04 NOTE — Unmapped (Addendum)
 [facility]   Physical Medicine and Rehabilitation     Patient Name:Darlene Lucas  MRN: [id number]  DOB: 1971/01/03  Age: 53 y.o.     ----------------------------------------------------------------------------------------------------------------------  Aug 04, 2023 2:27 PM. Documentation assistance provided by Darby Earls, medical scribe, at the direction of Elnoria Hails, MD.  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    --New right cervical radicular symptoms    --New thoracic axial pain    --Right cubital tunnel syndrome and right carpal tunnel syndrome    TREATMENT PLAN:   Referred to physical therapy at the [facility] for the neck and back at last visit, encouraged patient to call to set up appointment. If you have not been contacted in 1-2 weeks, please call [phone]    Recommend purchasing elbow splints to help with cubital tunnel syndrome. These should be worn at night as you sleep to prevent your elbow from bending past 90 degrees.    Performed bilateral SI joint/ligament steroid injection. See procedure note below    NEXT STEPS/FOLLOW UP: 2 months  Can consider epidural steroid injection if symptoms worsen or fail to improve after 6 weeks of physical therapy    PROCEDURE: Bilateral sacroiliac (SI) joint and ligament injection with ultrasound guidance  INDICATION: sacroiliac joint dysfunction and buttock pain  TECHNIQUE: After confirming written and informed consent discussing risks of bleeding, infection, nerve injury, medication reaction, and alternative of not doing the procedure, the patient was placed in the prone position. Pre-procedure vitals were reviewed. A TIME OUT was performed. The patient did not require sedation for the procedure. Using universal sterile precautions, procedure equipment and medications were prepared in sterile fashion and skin was prepped with chlorhexidine. In addition a sterile ultrasound probe cover was utilized as well as sterile ultrasound gel. Ultrasound visualization was utilized to delineate the anatomy of the sacrum and thereafter used to guide needle placement. The Bilateral SI joint was identified.  Skin was anesthetized using 2ml of 1% lidocaine without epinephrine. A 20 gauge 3.5 inch needle was directed toward the target using sonographic guidance. Under Ultrasound guidance, each SI joint was then injected with 5mg  dexamethasone and 1.5 cc of 1% lidocaine for a total injectate volume of 2 cc. Negative aspiration was noted prior to injection. The needle tip was visualized at a depth of 3 cm bilaterally. No paresthesias were reported with needle placement. The needle(s) were removed intact and a bandage was applied. The patient was monitored, reassessed and discharged after an appropriate observatory period.  COMPLICATIONS: The patient tolerated the procedure well and there were no complications.  PRE-PROCEDURE PAIN SCORE: 8 left, 8 right  POST-PROCEDURE PAIN SCORE: 6 Left, 5, right  POST-PROCEDURE EXAM: Marked improvement in pain.  No new neurologic compromise; patient discharged with normal baseline neurologic function.    Assistant: Silvestre Drum, a medical chaperone, was present for the entire procedure.    The imaging is on file and stored in a permanent location.      SUBJECTIVE:     Chief Complaint:   Back pain, RUE radic, repeat SI injections    08/05/2023  Pt presents in follow up after  obtaining an EMG, full spine x-rays, and a cervical spine MRI and referring to PT. Today pt reports she is not currently doing PT. She also endorses continued neck and low back pain. She notes that her entire left arm will start tingling especially when she's driving. She endorses weakness in both hands and carpal tunnel syndrome in her right arm. She feels like her neck is tight and gets tired easily. She states she is in the [facility] acupuncture study currently.    EMG (07/05/2023):  Conclusions:  This is a normal study.  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 Sonographic findings support.  --Right chronic lower cervical radiculopathy without active denervation.    MRI Cervical Spine (07/18/2023):  Impression  Multilevel cervical spondylosis and degenerative disc disease, most prominent at C6-C7 where there is severe spinal canal narrowing, mild cord compression and severe bilateral neuroforaminal narrowing.  Moderate spinal canal narrowing, cord indentation and severe left neural foraminal narrowing at C5-C6.    X-Ray Lumbar Spine (04/25/2023):  Impression  Post L2-S1 posterior decompression and instrumented fusion without imaged hardware complication. Similar retrolisthesis of L1 on L2.    X-Ray Thoracic Spine (04/25/2023):  Impression  Mild spondylosis of the midthoracic spine. No evidence for acute fracture or malalignment.    X-Ray Cervical Spine (04/25/2023):  Impression  Degenerative disc disease, particularly at C5-6 and C6-7.    04/06/2023  Pt presents in f/u after receiving b/l SIJ injection at last visit, referring to PT, and ordering lumbar x-ray. Today pt reports the SI joint injections provide enough relief to keep her pain manageable. She is planning to get her lumbar x-ray done. She feels like she may be having some bursitis in her left hip region. She is having a hard time standing back up from bending over due to her back and has new shooting pains down R arm to the fingers (digits 3 and 4 primarily) similar to radicular pain in her RLE. She endorses a severe itching sensation below her bra strap, which is usually associated w/ disc issues for her. She would like to proceed with repeat b/l SI joint injections today.     11/05/2022  Pt presents in f/u after receiving repeat bilateral SI joint injections at last visit. Today pt reports an itching sensation in the midback above her fusion, which she has associated with the onset of disc pain in the past. Also endorses clicking and popping in spine. She reports some pain shooting down arm but it is not specific. She also has noticed worsened posture with her gait recently. She is concerned this is due to worsening back pain.     07/16/22:  Pt presents in follow up after receiving repeat bilateral SI joint injections at last visit. Today, reports that the prior injection provided 70% relief. Pain has returned over the past week.    02/17/22:  Pt presents in f/u for repeat SIJ injections after receiving bilateral SIJ injections at last visit and ordering L5-S1 facet injections. Today pt reports previous bilateral SIJ injections provided 100% relief for 3-4 months. They help her to walk which in turn helps her to lose weight. She is starting PT today. Pt has lost 127 pounds since the first time she has started seeking help for her back pain.    10/16/21:  Pt presents in f/u for bilateral SI joint injections after ordering L5-S1 facet injections . Today pt reports recent knee surgery about a month ago. No current ABX.  Wants bilateral SI inj.    07/08/21:   Pt presents in f/u after receiving bilateral facet injections at L5/S1 on 01/26/21. Today pt reports facet and SI injections were both helpful. Pt reports that the SI joint injections were more beneficial for pain than the facet injections. She finds injections are more effective when done around the same time.     01/21/21:  Pt presents in f/u for bilateral SI joint injections. Bilateral L5/S1 facet injections scheduled for 01/26/21. Today pt reports she'd like bilateral SI joint injections. She hasn't had SI injections in a year. She usually gets good relief from a schedule of SI joint injections and facet injections. States right side pain is radiating around the right side. She feels she needs injections every 6-8 mos to stay ahead of the pain.     12/18/20  Pt presents in f/u (previous Dr. Kathyleen Parkins patient) after receiving bilateral L5/S1 facet injections and bilateral SI joint injection at last visit (01/28/20).     Chronic midline low back pain currently controlled though currently seeking repeat bilateral SI joint and facet injections.  Patient endorses not being interested in SI joint radiofrequency ablation if injections become sub  Therapeutic. Patient notes losing 50 lbs intentionally.     Symptom Location: Bilateral SI joint pain with radicular component halfway down posterior bilateral thights.   Symptom Character: burning and sharp  Symptom Onset/Mechanism: chronic (>/= 3 months), symptoms flared over last 4 months.   Temporal Pattern: constant  Night Pain: no  Unintended Weight Loss: no  Fever/Infection:no  Neuromotor Function: motor weakness - (yes) endorses chronic, permanant nerve weakness in R>L legs,  gait/coordination disturbance - (no), loss of bowel or bladder control - (no), saddle anesthesia - (no)     History of Present Illness:   Ms. Harsha is a 54 y.o. year old female being evaluated in follow up.   01/15/2020: 90% relief from bilateral L5-S1 facet joint injections and bilateral SI joint injections at last visit. Has been dealing with chronic right ankle pain from previous surgeries with Dr. Ollis Bi and working on [facility] with [facility]. Would like to repeat bilateral facet and SI joint injections given significant improvement in symptoms for >6 months with 80% relief.    Prior Interventions/Modalities:  - oxycodone, fentanyl    Meds:  - celebrex 200 mg BID      Prior Diagnostics:  XR Lumbar Spine 02/27/19  IMPRESSION:  -- Unchanged sequelae of posterior decompression and posterior spinal fixation extending from L2 to L5. No adverse radiographic hardware features.  -- Unchanged mild degenerative changes of the lumbar spine.    XR Pelvis 02/22/19  IMPRESSION:  1. Posterior decompression and posterior fusion L2-L5 with interbody spacer at L4-L5. No evidence of hardware complication.  2. Unchanged 2 mm retrolisthesis of L1 on L2, likely degenerative.  3. Mild degenerative disc disease at T12-L1 and L1-L2 and moderate degenerative disc disease at L5-S1, similar to the prior 2016 CT.  4. Bilateral sacroiliac fixation with periarticular sclerosis, likely reactive/postoperative.    Current Outpatient Medications   Medication Sig Dispense Refill    calcium citrate-vitamin D3 (CITRACAL + D MAXIMUM) 315 mg-6.25 mcg (250 unit) per tablet Take 1 tablet by mouth Two (2) times a day. 60 tablet 1    celecoxib (CELEBREX) 200 MG capsule Take 1 capsule (200 mg total) by mouth two (2) times a day as needed for pain. 180 capsule 3    chlorhexidine (PERIDEX) 0.12 % solution RINSE WITH  CAPFUL ONCE DAILY THEN SPIT. DO NOT RINSE, EAT, OR DRINK 30 MINUTES AFTER USE      conjugated estrogens (PREMARIN) 0.625 mg/gram vaginal cream Insert 0.5 g into the vagina daily. 30 g 11    diclofenac sodium 1.5 % Drop Apply 40 drops topically four (4) times a day as needed for pain 150 mL 2    estrogens, conjugated, (PREMARIN) 0.3 MG tablet Take 1 tablet (0.3 mg total) by mouth daily. 90 tablet 1    finasteride (PROPECIA) 1 mg tablet Take 1 tablet (1 mg total) by mouth daily. 90 tablet 3    fluticasone propionate (FLONASE) 50 mcg/actuation nasal spray Use 1 spray in each nostril once daily. 16 g 3    levothyroxine (SYNTHROID) 50 MCG tablet Take 1 tablet (50 mcg total) by mouth daily. 90 tablet 1    metFORMIN (GLUCOPHAGE-XR) 500 MG 24 hr tablet Take 2 tablets (1,000 mg total) by mouth daily with evening meal. 120 tablet 5    minoxidil (LONITEN) 2.5 MG tablet Take 1/2 tablet (1.25 mg total) by mouth daily. 45 tablet 3    montelukast (SINGULAIR) 10 mg tablet Take 1 tablet (10 mg total) by mouth daily. 90 tablet 3    pravastatin (PRAVACHOL) 40 MG tablet Take 1 tablet (40 mg total) by mouth nightly. 90 tablet 1    spironolactone (ALDACTONE) 100 MG tablet Take 1 tablet (100 mg total) by mouth two (2) times a day. 180 tablet 0    tirzepatide (MOUNJARO) 7.5 mg/0.5 mL PnIj Inject 7.5 mg subcutaneously every 7 days 2 mL 5    tretinoin (RETIN-A) 0.05 % cream Apply a pea sized amount at night 2-3 times a week x 2 weeks, then every other night for 2 weeks, then nightly. If getting dry, use a facial moisturizer in the morning such as Oil of Olay complete sensitive skin or CereVe AM 45 g 4    VENTOLIN HFA 90 mcg/actuation inhaler Inhale 2 puffs every six (6) hours as needed for wheezing. (Patient taking differently: Inhale 2 puffs every six (6) hours as needed for wheezing. PRN) 54 g 1    aspirin 81 MG chewable tablet Chew 1 tablet (81 mg total) daily. 30 tablet 0    blood sugar diagnostic (ACCU-CHEK GUIDE TEST STRIPS) Strp Use to check blood sugar once daily as directed 100 strip 3    hydrocortisone 2.5 % ointment Apply to affected area on ears twice daily until improved or for up to 2 weeks, whichever is sooner. Break for 1-2 weeks. Restart as needed. (Patient taking differently: Apply to affected area on ears twice daily until improved or for up to 2 weeks, whichever is sooner. Break for 1-2 weeks. Restart as needed.) 30 g 1    lancets (ACCU-CHEK SOFTCLIX LANCETS) Misc Use to check blood sugar once daily as directed 100 each 3    traMADol (ULTRAM) 50 mg tablet Take 1 tablet (50 mg total) by mouth every six (6) hours as needed for pain. (Patient not taking: Reported on 08/05/2023) 40 tablet 0     No current facility-administered medications for this visit.       Allergies:   Iohexol, Propoxyphene, Shellfish containing products, Sulfa (sulfonamide antibiotics), Topiramate, and Amoxicillin    Past Medical / Surgical History:     Past Medical History:   Diagnosis Date    Abnormal Pap smear of cervix Abnormal uterine bleeding     Was told possible adenomyosis, possible need for hysterectomy by Dr. Wendall Halls. Had US ,  and trial of 90 days of provera. 11/29/13 TVUS 5x6x4cm RO 2x1.5x1.5 LO 2x2x 1.6, EMS 5mm, abundant flow near endometrium c/w possible adenomyosis     Allergic Age 1    Amblyopia 1974    Anemia     Ankylosing spondylitis of site in spine   2005    Arthritis     Asthma (HHS-HCC)     Blepharitis November 2021    BMI 50.0-59.9, adult (CMS-HCC)     Bursitis 2007    Multiple areas    Chronic back pain     Chronic kidney disease     Constipation     Deep vein thrombosis       Diabetes mellitus   2020    Difficult intravenous access     Dry eyes     Dysplasia of cervix, high grade CIN 2     Edema     Elevated LFTs     Environmental allergies     Eye trauma 2020    fractured orbit. Unsure which eye.    Gait abnormality 2003    Uses of a cane as needed    GERD (gastroesophageal reflux disease)     Goiter 2015    monitoring levels    Gout 2019    Headache     Heart murmur 2018    Hyperlipidemia     Hypertension 2021    Hypokalemia     Kidney stone started 1986    [id number]    Osteoarthritis 2003    Osteoporosis     Prediabetes     Pulmonary embolism   1989, 1992    following miscarriages     Recurrent UTI     Right leg pain 10/2017    right fibula fracture    Scoliosis 1974    Spondylolisthesis     Thyroid function study abnormality 2015    seeing endocrine (TSH 5.37 12/25/13, T4 1.02       Past Surgical History:   Procedure Laterality Date    ANKLE FRACTURE SURGERY  2019    BACK SURGERY      CARPAL TUNNEL RELEASE      ENDOMETRIAL BIOPSY      11/2013    FOOT SURGERY  2019    FRACTURE SURGERY  2007/2019    HYSTERECTOMY      Knee arthroscopy Left 1987    KNEE ARTHROSCOPY      LAMINECTOMY      LUMBAR FUSION      L5 - S1 - multiple - also had hardware removed    LUMBAR FUSION  2008    L1-S1    PR ANKLE SCOPE,EXTENS DEBRIDEMNT Right 08/30/2018    Procedure: ARTHROSCOPY ANK SURG; DEBRID EXTEN - modifier 22;  Surgeon: Roselie Conger, MD;  Location: [facility] OR [facility];  Service: Orthopedics    PR ARTHROCENTESIS ASPIR&/INJ MAJOR JT/BURSA W/O US  Right 08/30/2018    Procedure: ARTHROCENTESIS, ASPIRATION AND/OR INJECTION; MAJOR JOINT OR BURSA (E.G.SHOULDER/HIP/KNEE);  Surgeon: Roselie Conger, MD;  Location: [facility] OR [facility];  Service: Orthopedics    PR BIOPSY OF VAGINA,EXTENSIVE N/A 09/20/2014    Procedure: BIOPSY VAGINAL MUCOSA; EXTENSIVE, REQUIRING SUTURE (INCLUDING CYSTS);  Surgeon: Early Glisson, MD;  Location: [facility] OR [facility];  Service: Advanced Laparoscopy    PR BIOPSY/EXCISION, LYMPH NODE(S) N/A 09/20/2014    Procedure: BX/EXC LYMPH NODE; OPEN, SUPERF (SEPART PROC);  Surgeon: Early Glisson, MD;  Location: [facility] OR [facility];  Service: Advanced Laparoscopy    PR CLOSED  TREATMENT PST MALLEOLUS FRACTURE W/O MANIP Right 11/10/2017    Procedure: CLOSED TREATMENT OF POSTERIOR MALLEOLUS FRACTURE; WITHOUT MANIPULATION;  Surgeon: Roselie Conger, MD;  Location: [facility] OR [facility];  Service: Ortho Foot & Ankle    PR COLPOSCOPY,ENTIRE VAGINA,W/BIOPSY(S) N/A 09/20/2014    Procedure: COLPOSCOPY OF THE ENTIRE VAGINA, WITH CERVIX IF PRESENT; WITH BIOPSY(S) OF VAGINA/CERVIX;  Surgeon: Early Glisson, MD;  Location: [facility] OR [facility];  Service: Advanced Laparoscopy    PR COLSC FLX W/RMVL OF TUMOR POLYP LESION SNARE TQ N/A 12/20/2019    Procedure: COLONOSCOPY FLEX; W/REMOV TUMOR/LES BY SNARE;  Surgeon: Percy Bracken, MD;  Location: [facility];  Service: Gastroenterology    PR CYSTOURETHROSCOPY N/A 06/13/2014    Procedure: CYSTOURETHROSCOPY (SEPARATE PROCEDURE);  Surgeon: Early Glisson, MD;  [facility];  Service: Advanced Laparoscopy    PR KNEE SCOPE,MED OR LAT MENIS REPAIR Right 09/15/2021    Procedure: ARTHROSCOPY, KNEE, SURGICAL; WITH MENISCUS REPAIR (MEDIAL OR LATERAL);  Surgeon: Beau Bound, MD;  Location: OR [facility];  Service: Orthopedics    PR LAP,VAG HYST,UTERUS 250GMS/<,SALP-OOPH N/A 06/13/2014    Procedure: ROBOTIC LAPAROSCOPY, SURGICAL, W/VAGINAL HYSTERECTOMY, UTERUS 250 GRAMS OR LESS; W/REMOVAL OF TUBE(S) &/OR OVARY(S);  Surgeon: Early Glisson, MD;  [facility]: [facility];  Service: Advanced Laparoscopy    PR OPEN TREATMENT PROXIMAL FIBULA/SHAFT FRACTURE Right 11/10/2017    Procedure: OPEN TREATMENT OF PROXIMAL FIBULA OR SHAFT FRACTURE, INCLUDES INTERNAL FIXATION, WHEN PERFORMED - Modifier 22;  Surgeon: Roselie Conger, MD;  Location: [facility] OR [facility];  Service: Ortho Foot & Ankle    PR OPEN TX DISTAL TIBIOFIBULAR JOINT DISRUPTION Right 11/10/2017    Procedure: OPEN TREATMENT OF DISTAL TIBIOFIBULAR JOINT (SYNDESMOSIS) DISRUPTION, INCLUDES INTERNAL FIXATION, IF DONE;  Surgeon: Roselie Conger, MD;  Location: [facility] OR [facility];  Service: Ortho Foot & Ankle    PR PELVIC EXAMINATION W ANESTH N/A 09/20/2014    Procedure: PELVIC EXAMINATION UNDER ANESTHESIA (OTHER THAN LOCAL);  Surgeon: Early Glisson, MD;  Location[facility] OR [facility];  Service: Advanced Laparoscopy    PR REMOVAL IMPLANT DEEP Right 08/30/2018    Procedure: R20--REMOVE IMPLANT; DEEP--RIGHT ANKLE x 2 - modifier 22;  Surgeon: Roselie Conger, MD;  Location: [facility] OR [facility];  Service: Orthopedics    PR REPAIR 1 COLLAT ANKLE LIGMNT,PRIMARY Right 11/10/2017    Procedure: REPAIR, PRIMARY, DISRUPTED LIGAMENT, ANKLE; COLLATERAL;  Surgeon: Roselie Conger, MD;  Location: [facility] OR [facility];  Service: Ortho Foot & Ankle    PR UPPER GI ENDOSCOPY,BIOPSY N/A 12/20/2019    Procedure: UGI ENDOSCOPY; WITH BIOPSY, SINGLE OR MULTIPLE;  Surgeon: Percy Bracken, MD;  [facility]: [facility];  Service: Gastroenterology    SI joint surgery  2004    Bilateral     SPINAL FUSION      TONSILLECTOMY      TUBAL LIGATION         Social History     Socioeconomic History    Marital status: Widowed     Spouse name: None    Number of children: None    Years of education: None    Highest education level: None Tobacco Use    Smoking status: Never    Smokeless tobacco: Never   Vaping Use    Vaping status: Never Used   Substance and Sexual Activity    Alcohol use: Not Currently     Alcohol/week: 0.0 standard drinks of alcohol  Comment: rare    Drug use: Never    Sexual activity: Yes     Partners: Male     Birth control/protection: Bilateral Tubal Ligation   Other Topics Concern    Do you use sunscreen? No    Tanning bed use? Yes    Are you easily burned? No    Excessive sun exposure? No    Blistering sunburns? No   Social History Narrative    Widowed, 07/05/2014    Has 2 children- ages 66, 43.    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                             Social Drivers of Psychologist, prison and probation services Strain: Low Risk  (04/22/2022)    Overall Financial Resource Strain (CARDIA)     Difficulty of Paying Living Expenses: Not very hard   Food Insecurity: No Food Insecurity (07/08/2023)    Hunger Vital Sign     Worried About Running Out of Food in the Last Year: Never true     Ran Out of Food in the Last Year: Never true   Transportation Needs: No Transportation Needs (07/08/2023)    PRAPARE - Transportation     Lack of Transportation (Medical): No     Lack of Transportation (Non-Medical): No   Stress: No Stress Concern Present (07/05/2019)    [employer] of Occupational Health - Occupational Stress Questionnaire     Feeling of Stress : Only a little   Housing: Low Risk  (07/08/2023)    Housing     Within the past 12 months, have you ever stayed: outside, in a car, in a tent, in an overnight shelter, or temporarily in someone else's home (i.e. couch-surfing)?: No     Are you worried about losing your housing?: No       Family History   Problem Relation Age of Onset    Pulmonary embolism Mother     Heart attack Mother     Stroke Mother     Scoliosis Mother     Asthma Mother     Early death Mother     Clotting disorder Mother Thrombophilia Mother         mother had bruise on leg clot floatedd to lungs an    Allergies Father     Gout Father     Osteoporosis Father     Arthritis Father     Angina Father     Cancer Sister         cervical cancer    No Known Problems Daughter     No Known Problems Daughter     Diabetes Paternal Grandfather     Diabetes Paternal Grandmother     Diabetes Sister     Thyroid disease Sister     Cancer Sister         Lung and GI tract    Clotting disorder Sister     Osteoporosis Sister     Scoliosis Sister     Early death Sister     Diabetes Maternal Grandfather     Asthma Daughter     Diabetes Daughter     Clotting disorder Daughter     Thyroid disease Daughter     Asthma Daughter     Asthma Daughter  Diabetes Daughter     Clotting disorder Daughter     Thyroid disease Daughter     Asthma Daughter     Early death Sister     Scoliosis Sister     Osteoporosis Brother     Anesthesia problems Neg Hx     Bleeding Disorder Neg Hx     Melanoma Neg Hx     Basal cell carcinoma Neg Hx     Squamous cell carcinoma Neg Hx     Breast cancer Neg Hx     Glaucoma Neg Hx     Macular degeneration Neg Hx        For any of the above entries which indicate no records on file, the patient reports no relevant history.                 Review of Systems:   Review of Systems was completed through a 10 organ system review and is listed in the chart.  Pertinent positives are noted in HPI or flowsheet and otherwise negative.  Patient has been instructed to followup with PCP or appropriate specialist for symptoms outside the purview of this speciality.    Answers submitted by the patient for this visit:  Back Pain Questionnaire (Submitted on 02/16/2022)  Chief Complaint: Back pain  Chronicity: chronic  Onset: more than 1 year ago  Frequency: daily  Progression since onset: gradually worsening  Pain location: gluteal, lumbar spine, sacro-iliac  Pain quality: aching, burning, cramping, shooting, stabbing  Radiates to: left thigh, right thigh  Pain - numeric: 6/10  Pain is: the same all the time  Aggravated by: bending, coughing, position, sitting, standing, stress, twisting  Stiffness is present: all day  abdominal pain: No  bladder incontinence: No  bowel incontinence: No  chest pain: No  dysuria: No  fever: No  headaches: No  leg pain: Yes  numbness: No  paresis: No  paresthesias: Yes  perianal numbness: No  tingling: Yes  weakness: Yes  weight loss: No  OBJECTIVE:     Vitals:     LMP 05/03/2017     The above medications, allergies, history, and ROS, and vitals have been reviewed.    Physical Exam:   GEN: alert and oriented, no apparent distress  HEENT: normocephalic, atraumatic, anicteric, moist mucous membranes  CV: normal heart rate  PULM: normal work of breathing  GI: nondistended  EXT: no swelling, edema in b/l UE and LE  SKIN: no visible ecchymosis or breakdown  PSYCH: normal mood and affect    NEURO:     Manual Muscle Testing    Left Upper Extremity Right Upper Extremity   Shoulder Abduction 5/5 5/5   Elbow Flexion 5/5 5/5   Elbow Extension  4/5 4/5   Wrist Extension  5/5 5/5   Finger Abduction  4/5 4/5       Left Lower Extremity Right Lower Extremity   Hip Flexion  5/5 5/5   Knee Extension  5/5 5/5   Ankle Dorsiflexion  5/5 5/5   Ankle Plantarflexion 5/5 4+/5 (pain limited)          Reflexes     Left Upper Extremity Right Upper Extremity    Triceps 2+ 2+   Biceps 2+ 2+   Brachioradialis 2+ 2+   Hoffman's  negative negative       Left Lower Extremity Right Lower Extremity    Patellar 2+ 2+   Achilles  2+ 2+  MSK:    Cervical Spine:   Inspection: Normal alignment. No erythema, discoloration, or asymmetry.  Palpation: No paraspinal muscle tendereness.  No vertebral body point tenderness.  ROM: normal flexion, rotation, lateral bend, pain with extension  Spurling's: - (positive) LUE, - (negative) RUE    Sacroiliac Joint:   Inspection: Normal alignment. No erythema, discoloration, or asymmetry.  Palpation: No tenderness at PSIS  Patricks: - (positive 8/10) LLE, - (positive 8/10) RLE    ----------------------------------------------------------------------------------------------------------------------  Aug 04, 2023 2:27 PM. Documentation assistance provided by Darby Earls, medical scribe, at the direction of Karvelas, Riley Cheadle, MD.  ----------------------------------------------------------------------------------------------------------------------    Elma Hacker, DO  [employer]    Physical Medicine and Rehabilitation, PGY2

## 2023-08-05 ENCOUNTER — Ambulatory Visit
Admit: 2023-08-05 | Discharge: 2023-08-06 | Payer: Medicaid (Managed Care) | Attending: Physical Medicine & Rehabilitation | Primary: Physical Medicine & Rehabilitation

## 2023-08-05 DIAGNOSIS — M533 Sacrococcygeal disorders, not elsewhere classified: Principal | ICD-10-CM

## 2023-08-05 DIAGNOSIS — G8929 Other chronic pain: Principal | ICD-10-CM

## 2023-08-05 MED ADMIN — dexAMETHasone sodium phos (PF) injection 10 mg: 10 mg | INTRA_ARTICULAR | @ 13:00:00 | Stop: 2023-08-05

## 2023-08-05 MED ADMIN — lidocaine (PF) (XYLOCAINE-MPF) 10 mg/mL (1 %) injection 5 mL: 5 mL | @ 13:00:00 | Stop: 2023-08-05

## 2023-08-05 NOTE — Unmapped (Addendum)
 Thanks so much for coming to see Dr. Kathee Palm today. It was a pleasure to meet you. This summary reviews the goals and plans we discussed at your visit today.     Below, you will see: a) your working diagnosis b) your treatment plan and c) your next steps and followup plan.    We care about your quality of life and are committed to helping optimize your functionality.       TREATMENT PLAN:   Referred to physical therapy at the [facility] for the neck and back. If you have not been contacted in 1-2 weeks, please call [phone]    Recommend purchasing elbow splints to help with cubital tunnel syndrome. These should be worn at night as you sleep to prevent your elbow from bending past 90 degrees.    Performed bilateral SI joint steroid injection. See procedure note below    NEXT STEPS/FOLLOW UP: 2 months  Can consider epidural steroid injection if symptoms worsen or fail to improve after 6 weeks of physical therapy

## 2023-08-08 NOTE — Unmapped (Signed)
 Addended by: Jhonnie Mosher on: 08/08/2023 09:49 AM     Modules accepted: Level of Service

## 2023-08-11 ENCOUNTER — Ambulatory Visit: Admit: 2023-08-11 | Payer: Medicaid (Managed Care)

## 2023-08-11 NOTE — Unmapped (Unsigned)
 Darlene Lucas is seen in consultation at the request of Dr. Brannon Calamity for the evaluation of ***    Dear Dr. Brannon Calamity,    Thank you for referring Darlene Lucas; as you know she is a 53 y.o. female who presents for the evaluation of ***.    Past Medical History  She  has a past medical history of Abnormal Pap smear of cervix, Abnormal uterine bleeding, Allergic (Age 35), Amblyopia (1974), Anemia, Ankylosing spondylitis of site in spine (2005), Arthritis, Asthma (HHS-HCC), Blepharitis (November 2021), BMI 50.0-59.9, adult (CMS-HCC), Bursitis (2007), Chronic back pain, Chronic kidney disease, Constipation, Deep vein thrombosis, Diabetes mellitus (2020), Difficult intravenous access, Dry eyes, Dysplasia of cervix, high grade CIN 2, Edema, Elevated LFTs, Environmental allergies, Eye trauma (2020), Gait abnormality (2003), GERD (gastroesophageal reflux disease), Goiter (2015), Gout (2019), Headache, Heart murmur (2018), Hyperlipidemia, Hypertension (2021), Hypokalemia, Kidney stone (started 1986), Osteoarthritis (2003), Osteoporosis, Prediabetes, Pulmonary embolism (1989, 1992), Recurrent UTI, Right leg pain (10/2017), Scoliosis (1974), Spondylolisthesis, and Thyroid function study abnormality (2015).    Past Surgical History  Her  has a past surgical history that includes Tubal ligation; Endometrial biopsy; Knee arthroscopy (Left, 1987); Tonsillectomy; SI joint surgery (2004); Lumbar fusion; Lumbar fusion (2008); pr lap,vag hyst,uterus 250gms/<,salp-ooph (N/A, 06/13/2014); pr cystourethroscopy (N/A, 06/13/2014); Hysterectomy; pr pelvic examination w anesth (N/A, 09/20/2014); pr biopsy of vagina,extensive (N/A, 09/20/2014); pr colposcopy,entire vagina,w/biopsy(s) (N/A, 09/20/2014); pr biopsy/excision, lymph node(s) (N/A, 09/20/2014); pr open treatment proximal fibula/shaft fracture (Right, 11/10/2017); pr open tx distal tibiofibular joint disruption (Right, 11/10/2017); pr repair 1 collat ankle ligmnt,primary (Right, 11/10/2017); pr closed treatment pst malleolus fracture w/o manip (Right, 11/10/2017); pr removal implant deep (Right, 08/30/2018); pr ankle scope,extens debridemnt (Right, 08/30/2018); pr arthrocentesis aspir&/inj major jt/bursa w/o us  (Right, 08/30/2018); pr upper gi endoscopy,biopsy (N/A, 12/20/2019); pr colsc flx w/rmvl of tumor polyp lesion snare tq (N/A, 12/20/2019); pr knee scope,med or lat menis repair (Right, 09/15/2021); Fracture surgery (2007/2019); Laminectomy; Spinal fusion; Carpal tunnel release; Ankle fracture surgery (2019); Back surgery; Foot surgery (2019); and Knee arthroscopy.    Past Family History  Her family history includes Allergies in her father; Angina in her father; Arthritis in her father; Asthma in her daughter, daughter, daughter, daughter, and mother; Cancer in her sister and sister; Clotting disorder in her daughter, daughter, mother, and sister; Diabetes in her daughter, daughter, maternal grandfather, paternal grandfather, paternal grandmother, and sister; Early death in her mother, sister, and sister; Gout in her father; Heart attack in her mother; No Known Problems in her daughter and daughter; Osteoporosis in her brother, father, and sister; Pulmonary embolism in her mother; Scoliosis in her mother, sister, and sister; Stroke in her mother; Thrombophilia in her mother; Thyroid disease in her daughter, daughter, and sister.    Past Social History  She  reports that she has never smoked. She has never used smokeless tobacco. She reports that she does not currently use alcohol. She reports that she does not use drugs.    Medications/Allergies  Her current medication(s) include:    Current Outpatient Medications:     aspirin 81 MG chewable tablet, Chew 1 tablet (81 mg total) daily., Disp: 30 tablet, Rfl: 0    blood sugar diagnostic (ACCU-CHEK GUIDE TEST STRIPS) Strp, Use to check blood sugar once daily as directed, Disp: 100 strip, Rfl: 3    calcium citrate-vitamin D3 (CITRACAL + D MAXIMUM) 315 mg-6.25 mcg (250 unit) per tablet, Take 1 tablet by mouth Two (2) times a day., Disp: 60 tablet, Rfl: 1  celecoxib (CELEBREX) 200 MG capsule, Take 1 capsule (200 mg total) by mouth two (2) times a day as needed for pain., Disp: 180 capsule, Rfl: 3    chlorhexidine (PERIDEX) 0.12 % solution, RINSE WITH CAPFUL ONCE DAILY THEN SPIT. DO NOT RINSE, EAT, OR DRINK 30 MINUTES AFTER USE, Disp: , Rfl:     conjugated estrogens (PREMARIN) 0.625 mg/gram vaginal cream, Insert 0.5 g into the vagina daily., Disp: 30 g, Rfl: 11    diclofenac sodium 1.5 % Drop, Apply 40 drops topically four (4) times a day as needed for pain, Disp: 150 mL, Rfl: 2    estrogens, conjugated, (PREMARIN) 0.3 MG tablet, Take 1 tablet (0.3 mg total) by mouth daily., Disp: 90 tablet, Rfl: 1    finasteride (PROPECIA) 1 mg tablet, Take 1 tablet (1 mg total) by mouth daily., Disp: 90 tablet, Rfl: 3    fluticasone propionate (FLONASE) 50 mcg/actuation nasal spray, Use 1 spray in each nostril once daily., Disp: 16 g, Rfl: 3    hydrocortisone 2.5 % ointment, Apply to affected area on ears twice daily until improved or for up to 2 weeks, whichever is sooner. Break for 1-2 weeks. Restart as needed. (Patient taking differently: Apply to affected area on ears twice daily until improved or for up to 2 weeks, whichever is sooner. Break for 1-2 weeks. Restart as needed.), Disp: 30 g, Rfl: 1    lancets (ACCU-CHEK SOFTCLIX LANCETS) Misc, Use to check blood sugar once daily as directed, Disp: 100 each, Rfl: 3    levothyroxine (SYNTHROID) 50 MCG tablet, Take 1 tablet (50 mcg total) by mouth daily., Disp: 90 tablet, Rfl: 1    metFORMIN (GLUCOPHAGE-XR) 500 MG 24 hr tablet, Take 2 tablets (1,000 mg total) by mouth daily with evening meal., Disp: 120 tablet, Rfl: 5    minoxidil (LONITEN) 2.5 MG tablet, Take 1/2 tablet (1.25 mg total) by mouth daily., Disp: 45 tablet, Rfl: 3    montelukast (SINGULAIR) 10 mg tablet, Take 1 tablet (10 mg total) by mouth daily., Disp: 90 tablet, Rfl: 3    pravastatin (PRAVACHOL) 40 MG tablet, Take 1 tablet (40 mg total) by mouth nightly., Disp: 90 tablet, Rfl: 1    spironolactone (ALDACTONE) 100 MG tablet, Take 1 tablet (100 mg total) by mouth two (2) times a day., Disp: 180 tablet, Rfl: 0    tirzepatide (MOUNJARO) 7.5 mg/0.5 mL PnIj, Inject 7.5 mg subcutaneously every 7 days, Disp: 2 mL, Rfl: 5    traMADol (ULTRAM) 50 mg tablet, Take 1 tablet (50 mg total) by mouth every six (6) hours as needed for pain. (Patient not taking: Reported on 08/05/2023), Disp: 40 tablet, Rfl: 0    tretinoin (RETIN-A) 0.05 % cream, Apply a pea sized amount at night 2-3 times a week x 2 weeks, then every other night for 2 weeks, then nightly. If getting dry, use a facial moisturizer in the morning such as Oil of Olay complete sensitive skin or CereVe AM, Disp: 45 g, Rfl: 4    VENTOLIN HFA 90 mcg/actuation inhaler, Inhale 2 puffs every six (6) hours as needed for wheezing. (Patient taking differently: Inhale 2 puffs every six (6) hours as needed for wheezing. PRN), Disp: 54 g, Rfl: 1  Allergies: Iohexol, Propoxyphene, Shellfish containing products, Sulfa (sulfonamide antibiotics), Topiramate, and Amoxicillin,    Review of systems   Review of systems was reviewed on attached notes/patient intake forms.      Physical Examination  LMP 05/03/2017     General:  well appearing, stated age, no distress   Communicates age appropriate, responds to speech well  Head - atraumatic, normocephalic   Face - no surface abnormalities  Eyes - no conjunctivitis, no scleritis/iritis, EOM full   Nose - external nose without deformity  Oral Cavity/Oropharynx/Lips:  Normal mucous membranes  Salivary Glands - no mass or asymmetry  Neck - no masses/adenopathy  Lymphatic - no neck lymphadenopathy  Cardiovascular - no clubbing/cyanosis/edema in hands  Respiratory - breathing comfortably, no audible wheezing or stridor  Psychiatric- appropriate mood and affect  Neurologic - cranial nerves 2-12 intact  Facial Strength - HB 1/6 bilaterally    Ears - External ear- normal, no lesions, no malformations   Otoscopy - ***    Audiogram  The audiogram was personally reviewed and interpreted. This demonstrates a ***    Tympanogram  The tympanogram was personally reviewed and interpreted. This demonstrates ***    Imaging  I personally reviewed and interpreted the patients imaging studies. ***    I reviewed outside medical records.    Assessment and Plan  The patient is a 53 y.o. female with ***    I appreciate the opportunity to participate in her care.

## 2023-08-22 NOTE — Unmapped (Unsigned)
 [facility]  Adult Audiology     HEARING AID CONSULTATION     PATIENT: Darlene Lucas, Darlene Lucas  DOB: [birth year]  MRN: [id number]  DOS: 08/23/2023    Mardee Shackle presented to clinic today for a consultation visit today to discuss amplification options. {Audiology Companion:88137}      SUBJECTIVE REPORT     Patient identified the following listening needs/goals:  {COSIGoals:104877}  {COSIGoals:104877}  {COSIGoals:104877}    Pertinent comments by family members and/or care givers: ***    CLINICAL OBSERVATIONS     Considerations reported today that should be taken into account when considering hearing aid options:  []  Manual dexterity challenges  []  Visual acuity  []  Known cognitive concerns (i.e., memory loss)  []  ***    IMPRESSIONS     Discussed amplification style and technology level best suited for patient's lifestyle. Due to reported concerns/considerations as well as patient listening needs/goals, the following features were identified as important to the patient:  []  Hearing Aid style: {HearingAidStyles:83895}  []  Rechargeable batteries  []  Bluetooth connectivity. Patient has the following smartphone model: ***  []  Accessories: TV streamer, Remote microphone    PLAN     The following hearing aid manufacturers and models were discussed today:   {HearingAidManufacturers:83896} *** {HearingAidStyles:83895}  {HearingAidManufacturers:83896} *** {HearingAidStyles:83895}  {HearingAidManufacturers:83896} *** {HearingAidStyles:83895}    Patient provided with literature from the [facility]).     EARMOLD IMPRESSION(S)      Otoscopy  RIGHT Ear: {otoscopy:64378::clear external auditory canal}  LEFT Ear: {otoscopy:64378::clear external auditory canal}    Cerumen Management: ***Cerumen was removed today without incident prior to completing hearing aid fitting procedures.    Earmold impressions were taken on {Desc; right/left/bilateral:5002} without incident.     ORDERING     Hearing aid(s):     Right Ear Left Ear Order Date {nachoice:70225} {nachoice:70225}   Location {location:87878} {location:87878}   Manufacturer {hamanu:70224} {hamanu:70224}   Model {nachoice:70225} {nachoice:70225}   Color {nachoice:70225} {nachoice:70225}    Serial Number     HAF Date     Warranty     Receiver strength and size {RECLENGTH:70226} {RECLENGTH:70226}   Slimtube Length  {STLENGTH:70227} {STLENGTH:70227}   Dome {domesize:70229} {DOMES:70228} {domesize:70229} {DOMES:70228}   Ear mold {nachoice:70225} {nachoice:70225}   Bluetooth     Battery {battery:71346} {battery:71346}   Charger Serial Number     Charger Warranty     Loss and Damage Claim  {landd:71347} {landd:71347}   CPT Codes {cptcodes:88586} {cptcodes:88586}     Bilateral Amplification:     Right Ear Left Ear   Order Date {nachoice:70225}   Location {location:87878}   Manufacturer {hamanu:70224}   Model {nachoice:70225}   Color {nachoice:70225}    Serial Number     HAF Date     Warranty     Receiver strength and size {RECLENGTH:70226} {RECLENGTH:70226}   Slimtube Length  {STLENGTH:70227} {STLENGTH:70227}   Dome {domesize:70229} {DOMES:70228} {domesize:70229} {DOMES:70228}   Ear mold {nachoice:70225} {nachoice:70225}   Bluetooth    Battery {battery:71346}   Accessory(s)    Accessory Warranty    Charger Serial Number    Charger Warranty    Loss and Damage Claim  {landd:71347} {landd:71347}   CPT Codes {cptcodes:88586} {cptcodes:88586}     Ear molds:     Right Ear Left Ear   Order Date {nachoice:70225} {nachoice:70225}   Location {location:87878} {location:87878}   Manufacturer {hamanu:70224} {hamanu:70224}   Serial Number      Style/Type  {emstyle:90852} {emstyle:90852}   Material  {emmaterial:90851} {emmaterial:90851}   Color  {nachoice:70225} {nachoice:70225}   Vent  size {nachoice:70225} {nachoice:70225}   Removal option  {nachoice:70225} {nachoice:70225}   CPT Codes {cptcodes:88586} {cptcodes:88586}     COUNSELING     [place] was counseled regarding realistic expectations and the role of the audiologist.    A price quote was signed and provided to patient. Patient was provided contact information for our financial counselor should ORLANDA FRANKUM have any financial concerns or questions.    Policies:  A payment of at least half the total cost is expected on the day of the fitting. Payment plans can be arranged with our financial counselor.   A 30-day trial period is provided with the hearing aid(s). If hearing aids are returned during the 30-day trial period in good working order, the purchase price of the hearing aid(s) and/or accessories will be returned to you unless other account balances exist at [facility] and/or [facility]. If the hearing aid(s) are lost or damaged within the trial period, the hearing aid(s) cannot be returned.   The cost of the fitting fee(s) is non-refundable  The cost of earmold(s) is non-refundable      RECOMMENDATIONS      Refer to ENT re: new ID of hearing loss    Consider trial with amplification pending medical clearance and patient motivation    Continue to monitor hearing annually   Contact clinic when ready to order hearing aids   {HA consult recommendations:96018}   {HA consult recommendations:96018}   {HA consult recommendations:96018}     ***, {Bachelor's ZOXWRU:04540}  Audiology Graduate Student Clinician    I was physically present and immediately available to direct and supervise tasks that were related to patient management. The direction and supervision was continuous throughout the time these tasks were performed.    Charges associated with today's visit:  {UNCADULTCODINGHACONSULT:74145}    Visit Time: {unccivisittime:73518}    Ruthanne Covey, AUD, AuD  Clinical Audiologist  [facility] Adult Audiology Program  Scheduling: [phone]

## 2023-08-23 MED FILL — PREMARIN 0.625 MG/GRAM VAGINAL CREAM: VAGINAL | 60 days supply | Qty: 30 | Fill #2

## 2023-08-23 MED FILL — MOUNJARO 7.5 MG/0.5 ML SUBCUTANEOUS PEN INJECTOR: SUBCUTANEOUS | 28 days supply | Qty: 2 | Fill #1

## 2023-08-23 MED FILL — FLUTICASONE PROPIONATE 50 MCG/ACTUATION NASAL SPRAY,SUSPENSION: NASAL | 60 days supply | Qty: 16 | Fill #2

## 2023-09-01 MED ORDER — PRAVASTATIN 40 MG TABLET
ORAL_TABLET | Freq: Every evening | ORAL | 1 refills | 90.00000 days | Status: CP
Start: 2023-09-01 — End: 2024-02-28
  Filled 2023-09-05: qty 90, 90d supply, fill #0

## 2023-09-01 NOTE — Unmapped (Signed)
 Patient is requesting the following refill  Requested Prescriptions     Pending Prescriptions Disp Refills    pravastatin (PRAVACHOL) 40 MG tablet 90 tablet 1     Sig: Take 1 tablet (40 mg total) by mouth nightly.       Recent Visits  Date Type Provider Dept   07/08/23 Office Visit Bettyjo Brunette, Madolyn Schlatter, NP [facility] [address] [facility]   04/12/23 Telemedicine Farrug, Madolyn Schlatter, NP [facility] [address] At [facility]   03/04/23 Telemedicine Baldwin Bones, FNP [facility] [address] At [facility]   02/25/23 Office Visit Farrug, Madolyn Schlatter, NP [facility] [address] At [facility]   02/09/23 Telemedicine Farrug, Madolyn Schlatter, NP [facility] [address] At [facility]   Showing recent visits within past 365 days and meeting all other requirements  Future Appointments  Date Type Provider Dept   10/07/23 Appointment Lawton Price, NP [facility] [address] At [facility]   Showing future appointments within next 365 days and meeting all other requirements       Labs: Cholesterol:   Cholesterol, Total (mg/dL)   Date Value   78/29/5621 240 (H)   ,   Triglycerides (mg/dL)   Date Value   30/86/5784 63   ,   Cholesterol, HDL (mg/dL)   Date Value   69/62/9528 82 (H)   ,   Cholesterol, LDL, Calculated (mg/dL)   Date Value   41/32/4401 145 (H)

## 2023-09-05 MED FILL — RETIN-A 0.05 % TOPICAL CREAM: TOPICAL | 30 days supply | Qty: 45 | Fill #1

## 2023-09-05 MED FILL — FINASTERIDE 1 MG TABLET: ORAL | 90 days supply | Qty: 90 | Fill #1

## 2023-09-16 ENCOUNTER — Ambulatory Visit: Admit: 2023-09-16 | Discharge: 2023-09-17 | Payer: Medicaid (Managed Care) | Attending: Family | Primary: Family

## 2023-09-16 DIAGNOSIS — M170 Bilateral primary osteoarthritis of knee: Principal | ICD-10-CM

## 2023-09-16 MED ADMIN — triamcinolone acetonide (KENALOG) injection 20 mg: 20 mg | INTRA_ARTICULAR | @ 12:00:00 | Stop: 2023-09-16

## 2023-09-16 NOTE — Unmapped (Signed)
 SPORTS MEDICINE RETURN VISIT    ASSESSMENT AND PLAN      Diagnosis ICD-10-CM Associated Orders   1. Primary osteoarthritis of both knees  M17.0 Lg Joint Inj: bilateral knee             Unfortunately, her insurance does not cover viscosupplementation injection and no operative intervention is recommended.  She will proceed with intra-articular steroid injection today.    Return if symptoms worsen or fail to improve.    Procedure(s):  Intra-articular steroid injections      SUBJECTIVE     Chief Complaint:   Chief Complaint   Patient presents with    Right Knee - Pain     Bilateral knee pain. Last injections 06/10/2023 [place])    Left Knee - Pain       History of Present Illness: 53 y.o. female who presents for bilateral knee pain, requesting intra-articular steroid injections.  Last Steroid injections in knees were 06/10/23.   No interval injury.    Past Medical History:   Past Medical History:   Diagnosis Date    Abnormal Pap smear of cervix     Abnormal uterine bleeding     Was told possible adenomyosis, possible need for hysterectomy by Dr. Rojelio. Had US , and trial of 90 days of provera. 11/29/13 TVUS 5x6x4cm RO 2x1.5x1.5 LO 2x2x 1.6, EMS 5mm, abundant flow near endometrium c/w possible adenomyosis     Allergic Age 79    Amblyopia 1974    Anemia     Ankylosing spondylitis of site in spine    2005    Arthritis     Asthma (HHS-HCC)     Blepharitis November 2021    BMI 50.0-59.9, adult (CMS-HCC)     Bursitis 2007    Multiple areas    Chronic back pain     Chronic kidney disease     Constipation     Deep vein thrombosis        Diabetes mellitus    2020    Difficult intravenous access     Dry eyes     Dysplasia of cervix, high grade CIN 2     Edema     Elevated LFTs     Environmental allergies     Eye trauma 2020    fractured orbit. Unsure which eye.    Gait abnormality 2003    Uses of a cane as needed    GERD (gastroesophageal reflux disease)     Goiter 2015    monitoring levels    Gout 2019    Headache     Heart murmur 2018    Hyperlipidemia     Hypertension 2021    Hypokalemia     Kidney stone started 1986    [id number]    Osteoarthritis 2003    Osteoporosis     Prediabetes     Pulmonary embolism    1989, 1992    following miscarriages     Recurrent UTI     Right leg pain 10/2017    right fibula fracture    Scoliosis 1974    Spondylolisthesis     Thyroid function study abnormality 2015    seeing endocrine (TSH 5.37 12/25/13, T4 1.02         OBJECTIVE     Physical Exam:  Vitals:   Wt Readings from Last 3 Encounters:   08/05/23 (P) 60.8 kg (134 lb)   07/08/23 60.8 kg (134 lb)   04/27/23 64.1 kg (141 lb 6.4  oz)     Estimated body mass index is 25.32 kg/m?? (pended) as calculated from the following:    Height as of 08/05/23: (P) 154.9 cm (5' 1).    Weight as of 08/05/23: (P) 60.8 kg (134 lb).  Gen: Well-appearing female in no acute distress  MSK: Gait is slow but steady.  No obvious effusion bilateral knees.   No pain throughout arc of range of motion.  Skin is warm, dry and intact without excessive warmth, erythema or ecchymosis.    Imaging/other tests: No new x-rays taken today.  X-rays dated 06/04/2022 show:  1. Unchanged early patellofemoral predominant tricompartmental osteoarthrosis.   2. Small right knee effusion.   3. Right knee chondrocalcinosis.       ADMINISTRATIVE     I have personally reviewed and interpreted the images (as available).  Point-of-care ultrasound imaging is on file and stored in a permanent location (if performed).  I have personally reviewed prior records and incorporated relevant information above (as available).    @[email or web address] @    PROCEDURES     Lg Joint Inj: bilateral knee on 09/16/2023 8:00 AM  Indications: pain  Details: 22 G needle, anterolateral approach  Laterality: bilateral  Location: knee  Medications (Right): 20 mg triamcinolone  acetonide 10 mg/mL  Medications (Left): 20 mg triamcinolone  acetonide 10 mg/mL  Outcome: tolerated well, no immediate complications  Procedure, treatment alternatives, risks and benefits explained, specific risks discussed. Consent was given by the patient. Immediately prior to procedure a time out was called to verify the correct patient, procedure, equipment, support staff and site/side marked as required. Patient was prepped and draped in the usual sterile fashion.     Medical Care Team Attestation: All ProcDoc orders were read back and verbally confirmed with the procedure provider, including but not limited to patient name, medication name, dose, and route, before any actions were taken.  Provider Attestation: The information documented by members of my medical care team was reviewed and verified for accuracy by me.           DME     DME ORDER:  Dx:  ,

## 2023-09-16 NOTE — Unmapped (Signed)
 Plan:You received a corticosteroid injection to reduce pain and inflammation.  Please note that it can take up to 2 weeks for this injection to fully work.  While many people will feel relief sooner, please be patient.      The injection contained a corticosteroid and a numbing agent.  The numbing agent can last for 1-6 hours.  After this wears off you may have increased pain until the steroid has a chance to work.    What are some of the possible side effects of a steroid injection?    Common side effects:  temporarily elevated blood sugar (in diabetic patients) that can last a few days   flushing of the skin, especially the face  temporary rise in blood pressure  discoloration or atrophy of the skin at the injection site    Call your doctor at once if you have:  persistent worsening pain or swelling, fever;  blurred vision, tunnel vision, eye pain, or seeing halos around lights;  fast or slow heartbeats;  increased blood pressure that is associated with severe headache, blurred vision, pounding in your neck or ears, anxiety, nosebleed;  headaches, ringing in your ears, dizziness, nausea, vision problems, pain behind your eyes    This is not a complete list of side effects and others may occur. Call your provider for medical advice about side effects.     What other drugs may be affected after the injection?  Many drugs can interact with steroids. Not all possible interactions are listed here. Tell your doctor about all your current medicines and any you start or stop using, especially:  an antibiotic or antifungal medication;  birth control pills or hormone replacement therapy;  a blood thinner (warfarin, Coumadin, and others);  a diuretic or water pill;  insulin or oral diabetes medicine;  medicine to treat tuberculosis;  a nonsteroidal anti-inflammatory drug or NSAID (aspirin, ibuprofen, naproxen, diclofenac, indomethacin, Advil, Aleve, Celebrex, and many others); or  seizure medication.      You can resume your normal daily activities, but consider resting the injected area for the next few days.          Thank you for coming to [facility] and our clinic today!     We aim to provide you with the highest quality, individualized care.  If you have any unanswered questions after the visit, please do not hesitate to reach out to [place] on MyChart or leave a message for the nurse.  ?  MyChart messages: These messages can be sent to your provider and will be checked by their clinical support staff.? The messages are checked throughout the day during normal business hours from 8:30 am-4:00 pm Monday-Friday, however responses may take up to 48 hours.? Please use this method of communication for non-urgent and non-emergent concerns, questions, refill requests or inquiries only.? ?Our team will help respond to all of your questions.? Please note that you may be asked to see a provider by either a telehealth or in person visit if it is deemed your questions are best handled in the clinic setting in person.??  ?  Please keep in mind, these messages are not real time communications, so be patient when waiting for a response.    If you do not have access to MyChart, do not know how to use MyChart or have an issue that may require more extensive discussion, please call the nurses' call line: [phone].? This line is checked throughout the day and will be  responded to as time allows.? Please note that return calls could take up to 48 hours, depending on the nature of the need.?  ?  If you have an issue that requires emergent attention that cannot wait; either call the Orthopaedics resident on call at [phone], consider coming to our [facility] walk-in clinic, or go to the nearest Emergency Department.    If you need to schedule future appointments, please call [phone].     We look forward to seeing you again in the future and appreciate you choosing [facility] for your care!    Thank you,                We provide innovative and comprehensive patient centered care that is supported by evidence-based research                                                                                                    RESEARCH PARTICIPATION    Please check out our current research studies to see if you or someone you know may qualify at:    [email or web address]

## 2023-09-20 ENCOUNTER — Ambulatory Visit: Admit: 2023-09-20 | Discharge: 2023-09-21 | Payer: Medicaid (Managed Care)

## 2023-09-20 MED ADMIN — triamcinolone acetonide (KENALOG-40) injection 40 mg: 40 mg | INTRA_ARTICULAR | @ 13:00:00 | Stop: 2023-09-20

## 2023-09-20 NOTE — Unmapped (Signed)
 Encounter Provider: Delon Donal Hoover, NP  Date of Service: 09/20/2023 Last encounter Orthopaedics: 05/24/2023   Last encounter this provider: 05/24/2023      Notes:     Primary Care Provider: Camelia Sherwood Sieving, NP  Referring Provider: Delon Donal [facility]    ICD-10-CM   1. Arthritis of right subtalar joint  M19.071    Orthopaedic notes: - s/p right fibula shaft & syndesmosis ORIF and deltoid suture anchor repair on 11/10/17 (Attending: [facility])  - s/p right ankle arthroscopic debridement and removal of broken syndesmotic screw on 08/30/2018 (Attending: [facility])    Physical Function CAT Score: (not recorded)  Pain Interference CAT Score: (not recorded)  Depression CAT Score: (not recorded)  Sleep CAT Score: (not recorded)  [email or web address]     Darlene Lucas is a 53 y.o. female     ASSESSMENT & PLAN       Assessment/Plan:     Right subtalar osteoarthritis    - Administered steroid injection.  - Advised activities as tolerated.  - Scheduled follow-up as needed.        Risks of procedure were discussed.  The patient verbalized understanding and provided consent.  A timeout was performed prior to procedure identifying the correct patient, location, medication.  4 ml lidocaine  and 40 mg kenalog  were injected into the R subtalar joint using aseptic technique.  The patient tolerated this well.  Advised to avoid vigorous activities for the next few days and then the patient may proceed with activity as tolerated.      Requested Prescriptions      No prescriptions requested or ordered in this encounter      No orders of the defined types were placed in this encounter.      History:  Reason for visit: ankle pain  HPI:  History of Present Illness    Darlene Lucas is a 53 year old female with right subtalar osteoarthritis who presents for follow-up and possible repeat steroid injection.    She was last seen in the clinic on May 24, 2023, for right subtalar osteoarthritis, at which time she received a steroid injection. The injection was effective, but she now experiences a 'square hot patch' in the area, which she attributes to nerve regeneration. This sensation is common for her due to previous back surgeries, which have led to complex regional pain syndrome. The area remains warmer than the surrounding skin, and she can feel a temperature difference.    Swelling is just starting, but it is not fully developed yet. There is tenderness in the area and some limitations in movement, particularly when moving the joint in certain directions, which causes 'weird pain' associated with nerve activity. She has recently completed a gain study at [facility] for acupuncture, which focused on reducing inflammation in her leg and was beneficial.    She has a service dog named Koren, who assists her when her back goes out and she experiences spasms.             Medical History Past Medical History[1]   Surgical History Past Surgical History[2]   Allergies Iohexol, Propoxyphene, Shellfish containing products, Sulfa (sulfonamide antibiotics), Topiramate, and Amoxicillin    Medications She has a current medication list which includes the following prescription(s): aspirin , accu-chek guide test strips, calcium  citrate-vitamin d3, celecoxib , chlorhexidine, premarin , diclofenac  sodium, estrogens  (conjugated), finasteride , fluticasone  propionate, hydrocortisone , lancets, levothyroxine , metformin , minoxidil , montelukast , pravastatin , spironolactone , mounjaro , tramadol , tretinoin , and ventolin  hfa.   Family History Her family history includes Allergies  in her father; Angina in her father; Arthritis in her father; Asthma in her daughter, daughter, daughter, daughter, and mother; Cancer in her sister and sister; Clotting disorder in her daughter, daughter, mother, and sister; Diabetes in her daughter, daughter, maternal grandfather, paternal grandfather, paternal grandmother, and sister; Early death in her mother, sister, and sister; Gout in her father; Heart attack in her mother; No Known Problems in her daughter and daughter; Osteoporosis in her brother, father, and sister; Pulmonary embolism in her mother; Scoliosis in her mother, sister, and sister; Stroke in her mother; Thrombophilia in her mother; Thyroid disease in her daughter, daughter, and sister.   Social History She reports that she has never smoked. She has never used smokeless tobacco. She reports that she does not currently use alcohol . She reports that she does not use drugs.Home address:[address] [place] [address] [place] [address]  Occupation:         Occupational History    Not on file     Social History     Social History Narrative    Widowed, 07/05/2014    Has 2 children- ages 67, 53.    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                                    Exam:  The encounter diagnosis was Arthritis of right subtalar joint.   Estimated body mass index is 25.32 kg/m?? (pended) as calculated from the following:    Height as of 08/05/23: (P) 154.9 cm (5' 1).    Weight as of 08/05/23: (P) 60.8 kg (134 lb).        Physical Exam   Physical Exam    CARDIOVASCULAR: Dorsalis pedis pulse 2+.  EXTREMITIES: Subtalar joint motion slightly limited with discomfort. TTP over the sinus Tarsi. No ligamentous laxity at the ankle.  SKIN: Skin warm with brisk capillary refill.                 Test Results  The encounter diagnosis was Arthritis of right subtalar joint.  Lab Results   Component Value Date    A1C 4.6 (L) 08/23/2022       No results found for: VITD    Imaging  No orders of the defined types were placed in this encounter.        DME ORDER:  Dx:  ,                 [1]   Past Medical History:  Diagnosis Date    Abnormal Pap smear of cervix     Abnormal uterine bleeding     Was told possible adenomyosis, possible need for hysterectomy by Dr. Rojelio. Had US , and trial of 90 days of provera. 11/29/13 TVUS 5x6x4cm RO 2x1.5x1.5 LO 2x2x 1.6, EMS 5mm, abundant flow near endometrium c/w possible adenomyosis     Allergic Age 67    Amblyopia 1974    Anemia     Ankylosing spondylitis of site in spine    2005    Arthritis     Asthma (HHS-HCC)     Blepharitis November 2021    BMI 50.0-59.9, adult (CMS-HCC)     Bursitis 2007    Multiple areas    Chronic back pain     Chronic kidney  disease     Constipation     Deep vein thrombosis        Diabetes mellitus    2020    Difficult intravenous access     Dry eyes     Dysplasia of cervix, high grade CIN 2     Edema     Elevated LFTs     Environmental allergies     Eye trauma 2020    fractured orbit. Unsure which eye.    Gait abnormality 2003    Uses of a cane as needed    GERD (gastroesophageal reflux disease)     Goiter 2015    monitoring levels    Gout 2019    Headache     Heart murmur 2018    Hyperlipidemia     Hypertension 2021    Hypokalemia     Kidney stone started 1986    [id number]    Osteoarthritis 2003    Osteoporosis     Prediabetes     Pulmonary embolism    1989, 1992    following miscarriages     Recurrent UTI     Right leg pain 10/2017    right fibula fracture    Scoliosis 1974    Spondylolisthesis     Thyroid function study abnormality 2015    seeing endocrine (TSH 5.37 12/25/13, T4 1.02   [2]   Past Surgical History:  Procedure Laterality Date    ANKLE FRACTURE SURGERY  2019    BACK SURGERY      CARPAL TUNNEL RELEASE      ENDOMETRIAL BIOPSY      11/2013    FOOT SURGERY  2019    FRACTURE SURGERY  2007/2019    HYSTERECTOMY      Knee arthroscopy Left 1987    KNEE ARTHROSCOPY      LAMINECTOMY      LUMBAR FUSION      L5 - S1 - multiple - also had hardware removed    LUMBAR FUSION  2008    L1-S1    PR ANKLE SCOPE,EXTENS DEBRIDEMNT Right 08/30/2018    Procedure: ARTHROSCOPY ANK SURG; DEBRID EXTEN - modifier 22;  Surgeon: Fonda Rosalynn Cooler, MD;  [facility];  Service: Orthopedics    PR ARTHROCENTESIS ASPIR&/INJ MAJOR JT/BURSA W/O US  Right 08/30/2018    Procedure: ARTHROCENTESIS, ASPIRATION AND/OR INJECTION; MAJOR JOINT OR BURSA (E.G.SHOULDER/HIP/KNEE);  Surgeon: Fonda Rosalynn Cooler, MD;  [facility]: [facility];  Service: Orthopedics    PR BIOPSY OF VAGINA,EXTENSIVE N/A 09/20/2014    Procedure: BIOPSY VAGINAL MUCOSA; EXTENSIVE, REQUIRING SUTURE (INCLUDING CYSTS);  Surgeon: Tinnie Cash, MD;  [facility];  Service: Advanced Laparoscopy    PR BIOPSY/EXCISION, LYMPH NODE(S) N/A 09/20/2014    Procedure: BX/EXC LYMPH NODE; OPEN, SUPERF (SEPART PROC);  Surgeon: Tinnie Cash, MD;  Location: [facility] OR [facility];  Service: Advanced Laparoscopy    PR CLOSED TREATMENT PST MALLEOLUS FRACTURE W/O MANIP Right 11/10/2017    Procedure: CLOSED TREATMENT OF POSTERIOR MALLEOLUS FRACTURE; WITHOUT MANIPULATION;  Surgeon: Fonda Rosalynn Cooler, MD;  Location: [facility];  Service: Ortho Foot & Ankle    PR COLPOSCOPY,ENTIRE VAGINA,W/BIOPSY(S) N/A 09/20/2014    Procedure: COLPOSCOPY OF THE ENTIRE VAGINA, WITH CERVIX IF PRESENT; WITH BIOPSY(S) OF VAGINA/CERVIX;  Surgeon: Tinnie Cash, MD;  Location: [facility] OR [facility];  Service: Advanced Laparoscopy    PR COLSC FLX W/RMVL OF TUMOR POLYP LESION SNARE TQ N/A 12/20/2019    Procedure: COLONOSCOPY FLEX; W/REMOV  TUMOR/LES BY SNARE;  Surgeon: Burnard Almarie Budd, MD;  [facility];  Service: Gastroenterology    PR CYSTOURETHROSCOPY N/A 06/13/2014    Procedure: CYSTOURETHROSCOPY (SEPARATE PROCEDURE);  Surgeon: Tinnie Cash, MD;  Location: [facility] OR [facility];  Service: Advanced Laparoscopy    PR KNEE SCOPE,MED OR LAT MENIS REPAIR Right 09/15/2021    Procedure: ARTHROSCOPY, KNEE, SURGICAL; WITH MENISCUS REPAIR (MEDIAL OR LATERAL);  Surgeon: Lamar Marsa Hamming, MD;  Location: [facility];  Service: Orthopedics    PR LAP,VAG HYST,UTERUS 250GMS/<,SALP-OOPH N/A 06/13/2014    Procedure: ROBOTIC LAPAROSCOPY, SURGICAL, W/VAGINAL HYSTERECTOMY, UTERUS 250 GRAMS OR LESS; W/REMOVAL OF TUBE(S) &/OR OVARY(S);  Surgeon: Tinnie Cash, MD;  Location: [facility] OR [facility];  Service: Advanced Laparoscopy    PR OPEN TREATMENT PROXIMAL FIBULA/SHAFT FRACTURE Right 11/10/2017    Procedure: OPEN TREATMENT OF PROXIMAL FIBULA OR SHAFT FRACTURE, INCLUDES INTERNAL FIXATION, WHEN PERFORMED - Modifier 22;  Surgeon: Fonda Rosalynn Cooler, MD;  Location: [facility] OR [facility];  Service: Ortho Foot & Ankle    PR OPEN TX DISTAL TIBIOFIBULAR JOINT DISRUPTION Right 11/10/2017    Procedure: OPEN TREATMENT OF DISTAL TIBIOFIBULAR JOINT (SYNDESMOSIS) DISRUPTION, INCLUDES INTERNAL FIXATION, IF DONE;  Surgeon: Fonda Rosalynn Cooler, MD;  Location: [facility];  Service: Ortho Foot & Ankle    PR PELVIC EXAMINATION W ANESTH N/A 09/20/2014    Procedure: PELVIC EXAMINATION UNDER ANESTHESIA (OTHER THAN LOCAL);  Surgeon: Tinnie Cash, MD;  [facility] OR [facility];  Service: Advanced Laparoscopy    PR REMOVAL IMPLANT DEEP Right 08/30/2018    Procedure: R20--REMOVE IMPLANT; DEEP--RIGHT ANKLE x 2 - modifier 22;  Surgeon: Fonda Rosalynn Cooler, MD;  Location: [facility] OR [facility];  Service: Orthopedics    PR REPAIR 1 COLLAT ANKLE LIGMNT,PRIMARY Right 11/10/2017    Procedure: REPAIR, PRIMARY, DISRUPTED LIGAMENT, ANKLE; COLLATERAL;  Surgeon: Fonda Rosalynn Cooler, MD;  Location: [facility];  Service: Ortho Foot & Ankle    PR UPPER GI ENDOSCOPY,BIOPSY N/A 12/20/2019    Procedure: UGI ENDOSCOPY; WITH BIOPSY, SINGLE OR MULTIPLE;  Surgeon: Burnard Almarie Budd, MD;  [facility]: [facility];  Service: Gastroenterology    SI joint surgery  2004    Bilateral     SPINAL FUSION      TONSILLECTOMY      TUBAL LIGATION

## 2023-09-21 DIAGNOSIS — J4520 Mild intermittent asthma, uncomplicated: Principal | ICD-10-CM

## 2023-09-21 MED ORDER — MONTELUKAST 10 MG TABLET
ORAL_TABLET | Freq: Every day | ORAL | 3 refills | 90.00000 days | Status: CP
Start: 2023-09-21 — End: 2024-09-20
  Filled 2023-09-27: qty 90, 90d supply, fill #0

## 2023-09-21 NOTE — Unmapped (Signed)
 Patient is requesting the following refill  Requested Prescriptions     Pending Prescriptions Disp Refills    montelukast  (SINGULAIR ) 10 mg tablet 90 tablet 3     Sig: Take 1 tablet (10 mg total) by mouth daily.       Recent Visits  Date Type Provider Dept   07/08/23 Office Visit Camelia Sherwood Sieving, NP [place] [facility] [address] [facility]   04/12/23 Telemedicine Farrug, Sherwood Sieving, NP [place] [facility] [address] [facility]   03/04/23 Telemedicine Geralene Levorn Geralds, FNP [place] [facility] [address] [facility]   02/25/23 Office Visit Camelia, Sherwood Sieving, NP [place] [facility] [address] [facility]   02/09/23 Telemedicine Farrug, Sherwood Sieving, NP [place] [facility] [address] At [facility]   Showing recent visits within past 365 days and meeting all other requirements  Future Appointments  Date Type Provider Dept   10/13/23 Appointment Lora Alfonso HERO, FNP [place] [facility] [address] [facility]   12/21/23 Appointment [place], Alfonso HERO, FNP  [facility] [address] At [facility]   Showing future appointments within next 365 days and meeting all other requirements       Labs: Not applicable this refill

## 2023-09-27 MED FILL — MOUNJARO 7.5 MG/0.5 ML SUBCUTANEOUS PEN INJECTOR: SUBCUTANEOUS | 28 days supply | Qty: 2 | Fill #2

## 2023-09-27 MED FILL — MINOXIDIL 2.5 MG TABLET: ORAL | 90 days supply | Qty: 45 | Fill #1

## 2023-10-06 NOTE — Unmapped (Addendum)
 [facility]   Physical Medicine and Rehabilitation     Patient Name:Darlene Lucas  MRN: [id number]  DOB: 27-Oct-1970  Age: 53 y.o.     ----------------------------------------------------------------------------------------------------------------------  October 06, 2023 2:25 PM. Documentation assistance provided by Jaquita Lerner, medical scribe, at the direction of [name], MD.  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    --Right cubital tunnel syndrome and right carpal tunnel syndrome    -- right cervical radicular symptoms    -- thoracic axial pain      TREATMENT PLAN:   Provided an external referral to physical therapy for the neck and back. Please take this to your location of choice.    Continue to wear elbow braces and nerve flossing exercises    Provided referral to [facility] hand surgery for right cubital tunnel syndrome.    We will hold off on SI joint injections until next visit.    As labs are stable ok to use tylenol  prn, no OTC NSAIDs as you are on celebrex     NEXT STEPS/FOLLOW UP: 3-4 months for bilateral SI joint/ligament steroid injection    SUBJECTIVE:     Chief Complaint:   Back pain, RUE radic, repeat SI injections    10/07/2023  Pt presents in follow up after receiving bilateral SI joint/ligament steroid injection and a referral for PT. Today pt reports the injection continues to provide 70% improvement, however wearing off at this time. Today, 7/10. She notes her SI pain is worse when her knees are hurting. The nighttime arm braces have been helpful, uses celebrex  as well. She has noticed some weakness and tingling in her hands and feet, but these symptoms are not consistent.  She has not been able to get in with physical therapy yet but has been doing home exercises. Would like an external PT referral.    Renal labs stable  AST/ALT stable    08/05/2023  Pt presents in follow up after obtaining an EMG, full spine x-rays, and a cervical spine MRI and referring to PT. Today pt reports she is not currently doing PT. She also endorses continued neck and low back pain. She notes that her entire left arm will start tingling especially when she's driving. She endorses weakness in both hands and carpal tunnel syndrome in her right arm. She feels like her neck is tight and gets tired easily. She states she is in the [facility] acupuncture study currently.    EMG (07/05/2023):  Conclusions:  This is a normal study.  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 Sonographic findings support.  --Right chronic lower cervical radiculopathy without active denervation.    MRI Cervical Spine (07/18/2023):  Impression  Multilevel cervical spondylosis and degenerative disc disease, most prominent at C6-C7 where there is severe spinal canal narrowing, mild cord compression and severe bilateral neuroforaminal narrowing.  Moderate spinal canal narrowing, cord indentation and severe left neural foraminal narrowing at C5-C6.    X-Ray Lumbar Spine (04/25/2023):  Impression  Post L2-S1 posterior decompression and instrumented fusion without imaged hardware complication. Similar retrolisthesis of L1 on L2.    X-Ray Thoracic Spine (04/25/2023):  Impression  Mild spondylosis of the midthoracic spine. No evidence for acute fracture or malalignment.    X-Ray Cervical Spine (04/25/2023):  Impression  Degenerative disc disease, particularly at C5-6 and C6-7.    04/06/2023  Pt presents in f/u after receiving b/l SIJ injection at last visit, referring to PT, and ordering lumbar x-ray. Today pt reports the SI joint injections provide enough relief to keep her pain manageable. She is planning to get her lumbar x-ray done. She feels like she may be having some bursitis in her left hip region. She is having a hard time standing back up from bending over due to her back and has new shooting pains down R arm to the fingers (digits 3 and 4 primarily) similar to radicular pain in her RLE. She endorses a severe itching sensation below her bra strap, which is usually associated w/ disc issues for her. She would like to proceed with repeat b/l SI joint injections today.     11/05/2022  Pt presents in f/u after receiving repeat bilateral SI joint injections at last visit. Today pt reports an itching sensation in the midback above her fusion, which she has associated with the onset of disc pain in the past. Also endorses clicking and popping in spine. She reports some pain shooting down arm but it is not specific. She also has noticed worsened posture with her gait recently. She is concerned this is due to worsening back pain.     07/16/22:  Pt presents in follow up after receiving repeat bilateral SI joint injections at last visit. Today, reports that the prior injection provided 70% relief. Pain has returned over the past week.    02/17/22:  Pt presents in f/u for repeat SIJ injections after receiving bilateral SIJ injections at last visit and ordering L5-S1 facet injections. Today pt reports previous bilateral SIJ injections provided 100% relief for 3-4 months. They help her to walk which in turn helps her to lose weight. She is starting PT today. Pt has lost 127 pounds since the first time she has started seeking help for her back pain.    10/16/21:  Pt presents in f/u for bilateral SI joint injections after ordering L5-S1 facet injections . Today pt reports recent knee surgery about a month ago. No current ABX.   Wants bilateral SI inj.    07/08/21:   Pt presents in f/u after receiving bilateral facet injections at L5/S1 on 01/26/21. Today pt reports facet and SI injections were both helpful. Pt reports that the SI joint injections were more beneficial for pain than the facet injections. She finds injections are more effective when done around the same time.     01/21/21:  Pt presents in f/u for bilateral SI joint injections. Bilateral L5/S1 facet injections scheduled for 01/26/21. Today pt reports she'd like bilateral SI joint injections. She hasn't had SI injections in a year. She usually gets good relief from a schedule of SI joint injections and facet injections. States right side pain is radiating around the right side. She feels she needs injections every 6-8 mos to stay ahead of the pain.     12/18/20  Pt presents in f/u (previous Dr. Loria patient) after receiving bilateral L5/S1 facet injections and bilateral SI joint injection at last visit (01/28/20).     Chronic midline low back pain currently controlled though currently seeking repeat bilateral SI joint and facet injections.  Patient endorses not being interested in SI joint radiofrequency ablation if injections become sub  Therapeutic.  Patient notes losing 50 lbs intentionally.     Symptom Location: Bilateral SI joint pain with radicular component halfway down posterior bilateral thights.   Symptom Character: burning and sharp  Symptom Onset/Mechanism: chronic (>/= 3 months), symptoms flared over last 4 months.   Temporal Pattern: constant  Night Pain: no  Unintended Weight Loss: no  Fever/Infection:no  Neuromotor Function: motor weakness - (yes) endorses chronic, permanant nerve weakness in R>L legs,  gait/coordination disturbance - (no), loss of bowel or bladder control - (no), saddle anesthesia - (no)     History of Present Illness:   Ms. Szostak is a 53 y.o. year old female being evaluated in follow up.   01/15/2020: 90% relief from bilateral L5-S1 facet joint injections and bilateral SI joint injections at last visit. Has been dealing with chronic right ankle pain from previous surgeries with Dr. Tisa and working on [facility] with [facility]. Would like to repeat bilateral facet and SI joint injections given significant improvement in symptoms for >6 months with 80% relief.    Prior Interventions/Modalities:  - oxycodone , fentanyl    Meds:  - celebrex  200 mg BID      Prior Diagnostics:  XR Lumbar Spine 02/27/19  IMPRESSION:  -- Unchanged sequelae of posterior decompression and posterior spinal fixation extending from L2 to L5. No adverse radiographic hardware features.  -- Unchanged mild degenerative changes of the lumbar spine.    XR Pelvis 02/22/19  IMPRESSION:  1. Posterior decompression and posterior fusion L2-L5 with interbody spacer at L4-L5. No evidence of hardware complication.  2. Unchanged 2 mm retrolisthesis of L1 on L2, likely degenerative.  3. Mild degenerative disc disease at T12-L1 and L1-L2 and moderate degenerative disc disease at L5-S1, similar to the prior 2016 CT.  4. Bilateral sacroiliac fixation with periarticular sclerosis, likely reactive/postoperative.    Current Outpatient Medications   Medication Sig Dispense Refill    aspirin  81 MG chewable tablet Chew 1 tablet (81 mg total) daily. 30 tablet 0    calcium  citrate-vitamin D3 (CITRACAL + D MAXIMUM) 315 mg-6.25 mcg (250 unit) per tablet Take 1 tablet by mouth Two (2) times a day. 60 tablet 1    celecoxib  (CELEBREX ) 200 MG capsule Take 1 capsule (200 mg total) by mouth two (2) times a day as needed for pain. 180 capsule 3    chlorhexidine (PERIDEX) 0.12 % solution RINSE WITH CAPFUL ONCE DAILY THEN SPIT. DO NOT RINSE, EAT, OR DRINK 30 MINUTES AFTER USE      conjugated estrogens  (PREMARIN ) 0.625 mg/gram vaginal cream Insert 0.5 g into the vagina daily. 30 g 11    diclofenac  sodium 1.5 % Drop Apply 40 drops topically four (4) times a day as needed for pain 150 mL 2    estrogens , conjugated, (PREMARIN ) 0.3 MG tablet Take 1 tablet (0.3 mg total) by mouth daily. 90 tablet 1    finasteride  (PROPECIA ) 1 mg tablet Take 1 tablet (1 mg total) by mouth daily. 90 tablet 3    fluticasone  propionate (FLONASE ) 50 mcg/actuation nasal spray Use 1 spray in each nostril once daily. 16 g 3    levothyroxine  (SYNTHROID ) 50 MCG tablet Take 1 tablet (50 mcg total) by mouth daily. 90 tablet 1    metFORMIN  (GLUCOPHAGE -XR) 500 MG 24 hr tablet Take 2 tablets (1,000 mg total) by mouth daily with evening meal. 120 tablet 5    minoxidil  (LONITEN ) 2.5 MG tablet Take 1/2 tablet (1.25 mg total) by mouth daily. 45 tablet 3  montelukast  (SINGULAIR ) 10 mg tablet Take 1 tablet (10 mg total) by mouth daily. 90 tablet 3    pravastatin  (PRAVACHOL ) 40 MG tablet Take 1 tablet (40 mg total) by mouth nightly. 90 tablet 1    spironolactone  (ALDACTONE ) 100 MG tablet Take 1 tablet (100 mg total) by mouth two (2) times a day. 180 tablet 0    tirzepatide  (MOUNJARO ) 7.5 mg/0.5 mL PnIj Inject 7.5 mg subcutaneously every 7 days 2 mL 5    tretinoin  (RETIN-A ) 0.05 % cream Apply a pea sized amount at night 2-3 times a week x 2 weeks, then every other night for 2 weeks, then nightly. If getting dry, use a facial moisturizer in the morning such as Oil of Olay complete sensitive skin or CereVe AM 45 g 4    VENTOLIN  HFA 90 mcg/actuation inhaler Inhale 2 puffs every six (6) hours as needed for wheezing. 54 g 1    blood sugar diagnostic (ACCU-CHEK GUIDE TEST STRIPS) Strp Use to check blood sugar once daily as directed 100 strip 3    hydrocortisone  2.5 % ointment Apply to affected area on ears twice daily until improved or for up to 2 weeks, whichever is sooner. Break for 1-2 weeks. Restart as needed. (Patient taking differently: Apply to affected area on ears twice daily until improved or for up to 2 weeks, whichever is sooner. Break for 1-2 weeks. Restart as needed.) 30 g 1    lancets (ACCU-CHEK SOFTCLIX LANCETS) Misc Use to check blood sugar once daily as directed 100 each 3    traMADol  (ULTRAM ) 50 mg tablet Take 1 tablet (50 mg total) by mouth every six (6) hours as needed for pain. (Patient not taking: Reported on 10/07/2023) 40 tablet 0     No current facility-administered medications for this visit.       Allergies:   Iohexol, Propoxyphene, Shellfish containing products, Sulfa (sulfonamide antibiotics), Topiramate, and Amoxicillin     Past Medical / Surgical History:     Past Medical History:   Diagnosis Date    Abnormal Pap smear of cervix     Abnormal uterine bleeding     Was told possible adenomyosis, possible need for hysterectomy by Dr. Rojelio. Had US , and trial of 90 days of provera. 11/29/13 TVUS 5x6x4cm RO 2x1.5x1.5 LO 2x2x 1.6, EMS 5mm, abundant flow near endometrium c/w possible adenomyosis     Allergic Age 60    Amblyopia 1974    Anemia     Ankylosing spondylitis of site in spine    2005    Arthritis     Asthma (HHS-HCC)     Blepharitis November 2021    BMI 50.0-59.9, adult (CMS-HCC)     Bursitis 2007    Multiple areas    Chronic back pain     Chronic kidney disease     Constipation     Deep vein thrombosis        Diabetes mellitus    2020    Difficult intravenous access     Dry eyes     Dysplasia of cervix, high grade CIN 2     Edema     Elevated LFTs     Environmental allergies     Eye trauma 2020    fractured orbit. Unsure which eye.    Gait abnormality 2003    Uses of a cane as needed    GERD (gastroesophageal reflux disease)     Goiter 2015    monitoring levels    Gout 2019  Headache     Heart murmur 2018    Hyperlipidemia     Hypertension 2021    Hypokalemia     Kidney stone started 1986    [id number]    Osteoarthritis 2003    Osteoporosis     Prediabetes     Pulmonary embolism    1989, 1992    following miscarriages     Recurrent UTI     Right leg pain 10/2017    right fibula fracture    Scoliosis 1974    Spondylolisthesis     Thyroid function study abnormality 2015    seeing endocrine (TSH 5.37 12/25/13, T4 1.02       Past Surgical History:   Procedure Laterality Date    ANKLE FRACTURE SURGERY  2019    BACK SURGERY      CARPAL TUNNEL RELEASE ENDOMETRIAL BIOPSY      11/2013    FOOT SURGERY  2019    FRACTURE SURGERY  2007/2019    HYSTERECTOMY      Knee arthroscopy Left 1987    KNEE ARTHROSCOPY      LAMINECTOMY      LUMBAR FUSION      L5 - S1 - multiple - also had hardware removed    LUMBAR FUSION  2008    L1-S1    PR ANKLE SCOPE,EXTENS DEBRIDEMNT Right 08/30/2018    Procedure: ARTHROSCOPY ANK SURG; DEBRID EXTEN - modifier 22;  Surgeon: Fonda Rosalynn Cooler, MD;  Location: [facility] OR [facility];  Service: Orthopedics    PR ARTHROCENTESIS ASPIR&/INJ MAJOR JT/BURSA W/O US  Right 08/30/2018    Procedure: ARTHROCENTESIS, ASPIRATION AND/OR INJECTION; MAJOR JOINT OR BURSA (E.G.SHOULDER/HIP/KNEE);  Surgeon: Fonda Rosalynn Cooler, MD;  [facility];  Service: Orthopedics    PR BIOPSY OF VAGINA,EXTENSIVE N/A 09/20/2014    Procedure: BIOPSY VAGINAL MUCOSA; EXTENSIVE, REQUIRING SUTURE (INCLUDING CYSTS);  Surgeon: Tinnie Cash, MD;  [facility]: [facility] OR [facility];  Service: Advanced Laparoscopy    PR BIOPSY/EXCISION, LYMPH NODE(S) N/A 09/20/2014    Procedure: BX/EXC LYMPH NODE; OPEN, SUPERF (SEPART PROC);  Surgeon: Tinnie Cash, MD;  [facility] OR [facility];  Service: Advanced Laparoscopy    PR CLOSED TREATMENT PST MALLEOLUS FRACTURE W/O MANIP Right 11/10/2017    Procedure: CLOSED TREATMENT OF POSTERIOR MALLEOLUS FRACTURE; WITHOUT MANIPULATION;  Surgeon: Fonda Rosalynn Cooler, MD;  Location: [facility] OR [facility];  Service: Ortho Foot & Ankle    PR COLPOSCOPY,ENTIRE VAGINA,W/BIOPSY(S) N/A 09/20/2014    Procedure: COLPOSCOPY OF THE ENTIRE VAGINA, WITH CERVIX IF PRESENT; WITH BIOPSY(S) OF VAGINA/CERVIX;  Surgeon: Tinnie Cash, MD;  Location: [facility] OR [facility];  Service: Advanced Laparoscopy    PR COLSC FLX W/RMVL OF TUMOR POLYP LESION SNARE TQ N/A 12/20/2019    Procedure: COLONOSCOPY FLEX; W/REMOV TUMOR/LES BY SNARE;  Surgeon: Burnard Almarie Budd, MD;  [facility]: [facility];  Service: Gastroenterology    PR CYSTOURETHROSCOPY N/A 06/13/2014    Procedure: CYSTOURETHROSCOPY (SEPARATE PROCEDURE);  Surgeon: Tinnie Cash, MD;  Location[facility] OR [facility];  Service: Advanced Laparoscopy    PR KNEE SCOPE,MED OR LAT MENIS REPAIR Right 09/15/2021    Procedure: ARTHROSCOPY, KNEE, SURGICAL; WITH MENISCUS REPAIR (MEDIAL OR LATERAL);  Surgeon: Lamar Marsa Hamming, MD;  Location: [facility];  Service: Orthopedics    PR LAP,VAG HYST,UTERUS 250GMS/<,SALP-OOPH N/A 06/13/2014    Procedure: ROBOTIC LAPAROSCOPY, SURGICAL, W/VAGINAL HYSTERECTOMY, UTERUS 250 GRAMS OR LESS; W/REMOVAL OF TUBE(S) &/OR OVARY(S);  Surgeon: Tinnie Cash, MD;  Location: [facility] OR [facility];  Service: Advanced Laparoscopy    PR OPEN TREATMENT PROXIMAL FIBULA/SHAFT FRACTURE Right 11/10/2017    Procedure: OPEN TREATMENT OF PROXIMAL FIBULA OR SHAFT FRACTURE, INCLUDES INTERNAL FIXATION, WHEN PERFORMED - Modifier 22;  Surgeon: Fonda Rosalynn Cooler, MD;  Location: [facility] OR [facility];  Service: Ortho Foot & Ankle    PR OPEN TX DISTAL TIBIOFIBULAR JOINT DISRUPTION Right 11/10/2017    Procedure: OPEN TREATMENT OF DISTAL TIBIOFIBULAR JOINT (SYNDESMOSIS) DISRUPTION, INCLUDES INTERNAL FIXATION, IF DONE;  Surgeon: Fonda Rosalynn Cooler, MD;  Location: [facility] OR [facility];  Service: Ortho Foot & Ankle    PR PELVIC EXAMINATION W ANESTH N/A 09/20/2014    Procedure: PELVIC EXAMINATION UNDER ANESTHESIA (OTHER THAN LOCAL);  Surgeon: Tinnie Cash, MD;  Location: [facility] OR [facility];  Service: Advanced Laparoscopy    PR REMOVAL IMPLANT DEEP Right 08/30/2018    Procedure: R20--REMOVE IMPLANT; DEEP--RIGHT ANKLE x 2 - modifier 22;  Surgeon: Fonda Rosalynn Cooler, MD;  Location: [facility] OR [facility];  Service: Orthopedics    PR REPAIR 1 COLLAT ANKLE LIGMNT,PRIMARY Right 11/10/2017    Procedure: REPAIR, PRIMARY, DISRUPTED LIGAMENT, ANKLE; COLLATERAL;  Surgeon: Fonda Rosalynn Cooler, MD;  Location: [facility] OR [facility];  Service: Ortho Foot & Ankle    PR UPPER GI ENDOSCOPY,BIOPSY N/A 12/20/2019    Procedure: UGI ENDOSCOPY; WITH BIOPSY, SINGLE OR MULTIPLE; Surgeon: Burnard Almarie Budd, MD;  [facility]: [facility];  Service: Gastroenterology    SI joint surgery  2004    Bilateral     SPINAL FUSION      TONSILLECTOMY      TUBAL LIGATION         Social History     Socioeconomic History    Marital status: Widowed     Spouse name: None    Number of children: None    Years of education: None    Highest education level: None   Tobacco Use    Smoking status: Never    Smokeless tobacco: Never   Vaping Use    Vaping status: Never Used   Substance and Sexual Activity    Alcohol  use: Not Currently     Alcohol /week: 0.0 standard drinks of alcohol      Comment: rare    Drug use: Never    Sexual activity: Yes     Partners: Male     Birth control/protection: Bilateral Tubal Ligation   Other Topics Concern    Do you use sunscreen? No    Tanning bed use? Yes    Are you easily burned? No    Excessive sun exposure? No    Blistering sunburns? No   Social History Narrative    Widowed, 07/05/2014    Has 2 children- ages 57, 11.    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                             Social Drivers of Psychologist, prison and probation services Strain: Low Risk  (04/22/2022)    Overall Financial Resource Strain (CARDIA)     Difficulty of Paying Living Expenses: Not very hard   Food Insecurity: No Food Insecurity (07/08/2023)    Hunger Vital Sign     Worried About Running Out of Food in the Last Year: Never true     Ran Out of Food in the Last Year: Never  true   Transportation Needs: No Transportation Needs (07/08/2023)    PRAPARE - Transportation     Lack of Transportation (Medical): No     Lack of Transportation (Non-Medical): No   Stress: No Stress Concern Present (07/05/2019)    [employer] of Occupational Health - Occupational Stress Questionnaire     Feeling of Stress : Only a little   Housing: Low Risk  (07/08/2023)    Housing     Within the past 12 months, have you ever stayed: outside, in a car, in a tent, in an overnight shelter, or temporarily in someone else's home (i.e. couch-surfing)?: No     Are you worried about losing your housing?: No       Family History   Problem Relation Age of Onset    Pulmonary embolism Mother     Heart attack Mother     Stroke Mother     Scoliosis Mother     Asthma Mother     Early death Mother     Clotting disorder Mother     Thrombophilia Mother         mother had bruise on leg clot floatedd to lungs an    Allergies Father     Gout Father     Osteoporosis Father     Arthritis Father     Angina Father     Cancer Sister         cervical cancer    No Known Problems Daughter     No Known Problems Daughter     Diabetes Paternal Grandfather     Diabetes Paternal Grandmother     Diabetes Sister     Thyroid disease Sister     Cancer Sister         Lung and GI tract    Clotting disorder Sister     Osteoporosis Sister     Scoliosis Sister     Early death Sister     Diabetes Maternal Grandfather     Asthma Daughter     Diabetes Daughter     Clotting disorder Daughter     Thyroid disease Daughter     Asthma Daughter     Asthma Daughter     Diabetes Daughter     Clotting disorder Daughter     Thyroid disease Daughter     Asthma Daughter     Early death Sister     Scoliosis Sister     Osteoporosis Brother     Anesthesia problems Neg Hx     Bleeding Disorder Neg Hx     Melanoma Neg Hx     Basal cell carcinoma Neg Hx     Squamous cell carcinoma Neg Hx     Breast cancer Neg Hx     Glaucoma Neg Hx     Macular degeneration Neg Hx        For any of the above entries which indicate no records on file, the patient reports no relevant history.                 Review of Systems:   Review of Systems was completed through a 10 organ system review and is listed in the chart.  Pertinent positives are noted in HPI or flowsheet and otherwise negative.  Patient has been instructed to followup with PCP or appropriate specialist for symptoms outside the purview of this speciality.    OBJECTIVE: Vitals:     Ht 154.9 cm (5' 1)  - Wt  60.8 kg (134 lb)  - LMP 05/03/2017  - BMI 25.32 kg/m??     The above medications, allergies, history, and ROS, and vitals have been reviewed.    Physical Exam:   GEN: alert and oriented, no apparent distress  HEENT: normocephalic, atraumatic, anicteric, moist mucous membranes  CV: normal heart rate  PULM: normal work of breathing  GI: nondistended  EXT: no swelling, edema in b/l UE and LE  SKIN: no visible ecchymosis or breakdown  PSYCH: normal mood and affect    NEURO:     Manual Muscle Testing    Left Upper Extremity Right Upper Extremity   Shoulder Abduction 5/5 5/5   Elbow Flexion 5/5 5/5   Elbow Extension  4/5  *pain limiting 4/5  *pain limiting   Wrist Extension  5/5 5/5   Finger Abduction  4/5 4/5       MSK:      Sacroiliac Joint:   Inspection: Normal alignment. No erythema, discoloration, or asymmetry.  Palpation: TTP SI bilaterally and right piriformis. Non tender to palpation over GT bilaterally  Patricks: - (positive 7/10) LLE, - (positive 7/10) RLE  Distraction- Positive    ----------------------------------------------------------------------------------------------------------------------  October 06, 2023 2:25 PM. Documentation assistance provided by Jaquita Lerner, medical scribe, at the direction of Karvelas, Luetta Charleston, MD.  ----------------------------------------------------------------------------------------------------------------------    Carola SAILOR. Veneda Kirksey, DO   PGY-3 Resident Physician - Physical Medicine and Rehabilitation  [facility]      Luetta FABIENE Sport, MD  Assistant Professor - PM&R  Musculoskeletal, Electrodiagnostic and Ultrasound Specialist  [employer] [facility] [employer]

## 2023-10-07 DIAGNOSIS — M549 Dorsalgia, unspecified: Principal | ICD-10-CM

## 2023-10-07 DIAGNOSIS — M542 Cervicalgia: Principal | ICD-10-CM

## 2023-10-07 DIAGNOSIS — G5621 Lesion of ulnar nerve, right upper limb: Principal | ICD-10-CM

## 2023-10-07 NOTE — Unmapped (Signed)
 Addended by: NANCYANN SHOW on: 10/07/2023 09:34 AM     Modules accepted: Level of Service

## 2023-10-07 NOTE — Unmapped (Addendum)
 Thanks so much for coming to see Dr. Nancyann today. It was a pleasure to meet you. This summary reviews the goals and plans we discussed at your visit today.     Below, you will see: a) your working diagnosis b) your treatment plan and c) your next steps and followup plan.    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    --Right cubital tunnel syndrome and right carpal tunnel syndrome    -- right cervical radicular symptoms    -- thoracic axial pain        TREATMENT PLAN:   Provided an external referral to physical therapy for the neck and back. Please take this to your location of choice.    Continue to wear elbow braces and nerve flossing exercises    Provided referral to [facility] hand surgery for right cubital tunnel syndrome.    We will hold off on SI joint injections until next visit.    NEXT STEPS/FOLLOW UP: 3-4 months for bilateral SI joint/ligament steroid injection    We care about your quality of life and are committed to helping optimize your functionality.

## 2023-10-13 ENCOUNTER — Encounter: Admit: 2023-10-13 | Discharge: 2023-10-13 | Payer: Medicaid (Managed Care)

## 2023-10-13 DIAGNOSIS — H109 Unspecified conjunctivitis: Principal | ICD-10-CM

## 2023-10-13 DIAGNOSIS — E139 Other specified diabetes mellitus without complications: Principal | ICD-10-CM

## 2023-10-13 DIAGNOSIS — M1990 Unspecified osteoarthritis, unspecified site: Principal | ICD-10-CM

## 2023-10-13 DIAGNOSIS — E039 Hypothyroidism, unspecified: Principal | ICD-10-CM

## 2023-10-13 DIAGNOSIS — I10 Essential (primary) hypertension: Principal | ICD-10-CM

## 2023-10-13 DIAGNOSIS — R399 Unspecified symptoms and signs involving the genitourinary system: Principal | ICD-10-CM

## 2023-10-13 DIAGNOSIS — H01009 Unspecified blepharitis unspecified eye, unspecified eyelid: Principal | ICD-10-CM

## 2023-10-13 DIAGNOSIS — G8929 Other chronic pain: Principal | ICD-10-CM

## 2023-10-13 DIAGNOSIS — M25561 Pain in right knee: Principal | ICD-10-CM

## 2023-10-13 LAB — COMPREHENSIVE METABOLIC PANEL
ALBUMIN: 4.6 g/dL (ref 3.4–5.0)
ALKALINE PHOSPHATASE: 57 U/L (ref 46–116)
ALT (SGPT): 18 U/L (ref 10–49)
ANION GAP: 7 mmol/L (ref 5–14)
AST (SGOT): 19 U/L (ref <=34)
BILIRUBIN TOTAL: 2.1 mg/dL — ABNORMAL HIGH (ref 0.3–1.2)
BLOOD UREA NITROGEN: 23 mg/dL (ref 9–23)
BUN / CREAT RATIO: 26
CALCIUM: 9.8 mg/dL (ref 8.7–10.4)
CHLORIDE: 108 mmol/L — ABNORMAL HIGH (ref 98–107)
CO2: 22 mmol/L (ref 20.0–31.0)
CREATININE: 0.87 mg/dL (ref 0.55–1.02)
EGFR CKD-EPI (2021) FEMALE: 79 mL/min/1.73m2 (ref >=60)
GLUCOSE RANDOM: 95 mg/dL (ref 70–99)
POTASSIUM: 4.5 mmol/L (ref 3.4–4.8)
PROTEIN TOTAL: 7.6 g/dL (ref 5.7–8.2)
SODIUM: 137 mmol/L (ref 135–145)

## 2023-10-13 LAB — LIPID PANEL
CHOLESTEROL: 202 mg/dL — ABNORMAL HIGH (ref <200)
HDL CHOLESTEROL: 77 mg/dL (ref >50)
LDL CHOLESTEROL CALCULATED: 114 mg/dL — ABNORMAL HIGH (ref <100)
NON-HDL CHOLESTEROL: 125 mg/dL (ref <130)
TRIGLYCERIDES: 69 mg/dL (ref <150)

## 2023-10-13 LAB — ALBUMIN / CREATININE URINE RATIO
ALBUMIN QUANT URINE: 0.5 mg/dL
ALBUMIN/CREATININE RATIO: 2.4 ug/mg (ref 0.0–30.0)
CREATININE, URINE: 206.6 mg/dL

## 2023-10-13 LAB — TSH: THYROID STIMULATING HORMONE: 1.579 u[IU]/mL (ref 0.550–4.780)

## 2023-10-13 LAB — HEMOGLOBIN A1C
ESTIMATED AVERAGE GLUCOSE: 100 mg/dL
HEMOGLOBIN A1C: 5.1 % (ref 4.8–5.6)

## 2023-10-13 MED ORDER — POLYMYXIN B SULFATE 10,000 UNIT-TRIMETHOPRIM 1 MG/ML EYE DROPS
Freq: Four times a day (QID) | OPHTHALMIC | 0 refills | 50.00000 days | Status: CP
Start: 2023-10-13 — End: ?

## 2023-10-13 NOTE — Unmapped (Signed)
 Assessment and Plan:     Darlene Lucas was seen today for follow-up.    Diagnoses and all orders for this visit:    Acquired hypothyroidism          -     Adherent with 50 mcg levothyroxine         -     TSH:1.579  -     TSH    Diabetes 1.5, managed as type 2             -     Diet advice, activity, A1c today 5.1        -     1000 mg metformin , tirzepatide   -     Microalbumin / creatinine urine ratio  -     Hemoglobin A1c  -     PCV-21 Capvaxive    Essential hypertension  High cholesterol          -     Taking spironolactone --hair loss, normo-tensive levels today        -      Cr 0.87, K 4.5, TC 202, LDL 114  -     Comprehensive Metabolic Panel, lipid, Vitamin D     Arthritis  Chronic pain of right knee  Other back pain, unspecified chronicity            - Managed by PM & R/ortho-planning to re-start PT with therapist she has worked with for many years         -   Taking celebrex , self-discontinued narcotics many years ago      Lower urinary tract symptoms (LUTS)          -     Increase fluids, avoid known bladder irritants  -     POCT urinalysis dipstick: negative WBC, negative nitrates  -     Urine Culture: pending     Blepharitis, unspecified laterality, unspecified type          -     Rx for abx drops, home care advice/reassurance  -     polymyxin B  sulf-trimethoprim  (POLYTRIM ) 10,000 unit- 1 mg/mL Drop; Administer 1 drop to both eyes every six (6) hours.            Barriers to recommended plan: None identified    Return for 4-6 months .      Subjective:     HPI: Darlene Lucas is a 53 y.o. female here for Follow-up (Prefers 90 day prescriptions. ).    History of Present Illness    Darlene Lucas is a 53 year old female with chronic pain and multiple orthopedic issues who presents for pain management and medication refills.    She has chronic pain following a significant weight loss of 178 pounds. She previously discontinued pain management due to concerns about medication overuse, including a 100 mg fentanyl patch and 90 mg oxycodone  three times daily. She has a history of seven back surgeries and conditions including ankylosing spondylitis, spondylolisthesis, and general osteoarthritis. She currently takes Celebrex  for her chronic pain.    She experiences symptoms of blepharitis, which she attributes to dry eyes.  Would appreciate rx for antibiotic drops    She feels dehydrated and suspects a urinary tract infection due to the heat. She mentions her potassium levels naturally run low, leading to severe cramping, which she manages with extra potassium and dietary adjustments.    She has a history of significant weight fluctuations, having gained back 100 pounds during  menopause after her initial weight loss. Menopause exacerbated her arthritis symptoms due to decreased estrogen levels.    She has been dealing with chronic back issues since an injury on December 12, 2001, which resulted in ruptured discs and ongoing complications. She reports numbness in her arms due to ulnar nerve issues and osteophytes in her neck, which are being evaluated for potential surgical intervention.        Here with service dog-Tony    I have reviewed past medical, surgical, medications, allergies, social and family histories today and updated them in Epic where appropriate.    ROS:   Review of Systems   Constitutional:  Negative for fever.   Gastrointestinal:  Negative for abdominal pain.   Genitourinary:  Negative for dysuria and pelvic pain.   Musculoskeletal:  Positive for back pain.   Neurological:  Positive for weakness, numbness and headaches.   All other systems reviewed and are negative.     PHQ-9 PHQ-9 Total Score   07/08/2023  10:30 AM 2      GAD7 Total Score GAD-7 Total Score   07/08/2023  10:30 AM 0      Review of systems negative unless otherwise noted as per HPI.      Objective:     Vitals:    10/13/23 1122   BP: 110/70   Pulse: 97   Temp: 36.7 ??C (98.1 ??F)   SpO2: 98%     Body mass index is 26.02 kg/m??.    Physical Exam  Vitals and nursing note reviewed.   Constitutional:       Appearance: Normal appearance.   HENT:      Head: Normocephalic and atraumatic.   Eyes:      Comments: Mild irritation noted in B conjunctiva    Cardiovascular:      Rate and Rhythm: Normal rate and regular rhythm.      Pulses: Normal pulses.      Heart sounds: Normal heart sounds.   Pulmonary:      Effort: Pulmonary effort is normal.      Breath sounds: Normal breath sounds.   Musculoskeletal:         General: Normal range of motion.      Cervical back: Normal range of motion and neck supple.   Skin:     General: Skin is warm.      Capillary Refill: Capillary refill takes less than 2 seconds.   Neurological:      General: No focal deficit present.      Mental Status: She is alert and oriented to person, place, and time. Mental status is at baseline.   Psychiatric:         Mood and Affect: Mood normal.         Behavior: Behavior normal.         Thought Content: Thought content normal.         Judgment: Judgment normal.            Medication adherence and barriers to the treatment plan have been addressed. Opportunities to optimize healthy behaviors have been discussed. Patient / caregiver voiced understanding.   I personally spent 30 minutes face-to-face and non-face-to-face in the care of this patient, which includes all pre, intra, and post visit time on the date of service.  Alfonso Grow, DNP, FNP-C  [facility]  [phone] (F)  Answers submitted by the patient for this visit:  Back Pain Questionnaire (Submitted on  10/07/2023)  Chief Complaint: Back pain    Note - This record has been created using [employer]. Chart creation errors have been sought, but may not always have been located. Such creation errors do not reflect on the standard of medical care.

## 2023-10-15 LAB — VITAMIN D 25 HYDROXY: VITAMIN D, TOTAL (25OH): 25.5 ng/mL (ref 20.0–80.0)

## 2023-10-17 NOTE — Unmapped (Unsigned)
 [facility]  Adult Audiology     HEARING AID CONSULTATION     PATIENT: Darlene Lucas, Darlene Lucas  DOB: Mar 27, 1970  MRN: [id number]  DOS: 10/19/2023    Darlene Lucas presented to clinic today for a consultation visit today to discuss amplification options. Patient was accompanied by her service dog to today's appointment.      SUBJECTIVE REPORT     Patient identified the following listening needs/goals:  Conversation with group in noise  Television/Radio at normal volume  Soft spoken individuals    CLINICAL OBSERVATIONS     Considerations reported today that should be taken into account when considering hearing aid options:  []  Manual dexterity challenges  []  Visual acuity  []  Known cognitive concerns (i.e., memory loss)    IMPRESSIONS     Discussed amplification style and technology level best suited for patient's lifestyle. Due to reported concerns/considerations as well as patient listening needs/goals, the following features were identified as important to the patient:  [x]  Hearing Aid style: RIC/RITE  [x]  Rechargeable batteries  [x]  Bluetooth connectivity. Patient has the following smartphone model:   []  Accessories: TV streamer, Remote microphone    PLAN     The following hearing aid manufacturers and models were discussed today:   Oticon Zircon 2R RIC/RITE in right ear only vs both ears    Patient provided with literature from the [facility]).     EARMOLD IMPRESSION(S)      Otoscopy  RIGHT Ear: clear external auditory canal  LEFT Ear: clear external auditory canal    COUNSELING     Darlene Lucas was counseled regarding realistic expectations and the role of the audiologist.    A price quote was signed and provided to patient. Patient was provided contact information for our financial counselor should Darlene Lucas have any financial concerns or questions.    Policies:  A payment of at least half the total cost is expected on the day of the fitting. Payment plans can be arranged with our financial counselor.   A 30-day trial period is provided with the hearing aid(s). If hearing aids are returned during the 30-day trial period in good working order, the purchase price of the hearing aid(s) and/or accessories will be returned to you unless other account balances exist at [facility] and/or [facility]. If the hearing aid(s) are lost or damaged within the trial period, the hearing aid(s) cannot be returned.   The cost of the fitting fee(s) is non-refundable  The cost of earmold(s) is non-refundable      RECOMMENDATIONS      Refer to ENT re: new ID of hearing loss    Consider trial with amplification pending medical clearance and patient motivation    Continue to monitor hearing annually   Contact clinic when ready to order hearing aids     [employer], B.S.  Audiology Graduate Student Clinician    I was physically present and immediately available to direct and supervise tasks that were related to patient management. The direction and supervision was continuous throughout the time these tasks were performed.    Charges associated with today's visit:  No Charge - Quantity: 3    Visit Time: 45 min    Darryle JONELLE Bellman, AUD  Clinical Audiologist  [facility]  Scheduling: [phone] and the role of the audiologist.    A price quote was signed and provided to patient. Patient was provided contact information for our financial counselor should  Darlene Lucas have any financial concerns or questions.    Policies:  A payment of at least half the total cost is expected on the day of the fitting. Payment plans can be arranged with our financial counselor.   A 30-day trial period is provided with the hearing aid(s). If hearing aids are returned during the 30-day trial period in good working order, the purchase price of the hearing aid(s) and/or accessories will be returned to you unless other account balances exist at [facility] and/or [facility]. If the hearing aid(s) are lost or damaged within the trial period, the hearing aid(s) cannot be returned.   The cost of the fitting fee(s) is non-refundable  The cost of earmold(s) is non-refundable      RECOMMENDATIONS      Refer to ENT re: new ID of hearing loss    Consider trial with amplification pending medical clearance and patient motivation    Continue to monitor hearing annually   Contact clinic when ready to order hearing aids   {HA consult recommendations:96018}   {HA consult recommendations:96018}   {HA consult recommendations:96018}     ***, {Bachelor's Darlene Lucas:31014}  Audiology Graduate Student Clinician    I was physically present and immediately available to direct and supervise tasks that were related to patient management. The direction and supervision was continuous throughout the time these tasks were performed.    Charges associated with today's visit:  {UNCADULTCODINGHACONSULT:74145}    Visit Time: {unccivisittime:73518}    Darryle JONELLE Bellman, AUD, AuD  Clinical Audiologist  [facility]  Scheduling: [phone]

## 2023-10-19 ENCOUNTER — Ambulatory Visit: Admit: 2023-10-19 | Discharge: 2023-10-20 | Payer: Medicaid (Managed Care)

## 2023-10-27 DIAGNOSIS — L658 Other specified nonscarring hair loss: Principal | ICD-10-CM

## 2023-10-27 MED ORDER — SPIRONOLACTONE 100 MG TABLET
ORAL_TABLET | Freq: Two times a day (BID) | ORAL | 0 refills | 90.00000 days
Start: 2023-10-27 — End: 2024-04-24

## 2023-10-28 MED ORDER — SPIRONOLACTONE 100 MG TABLET
ORAL_TABLET | Freq: Two times a day (BID) | ORAL | 3 refills | 90.00000 days | Status: CP
Start: 2023-10-28 — End: 2024-04-25
  Filled 2023-10-31: qty 180, 90d supply, fill #0

## 2023-10-31 MED FILL — METFORMIN ER 500 MG TABLET,EXTENDED RELEASE 24 HR: ORAL | 90 days supply | Qty: 180 | Fill #1

## 2023-10-31 MED FILL — FLUTICASONE PROPIONATE 50 MCG/ACTUATION NASAL SPRAY,SUSPENSION: NASAL | 60 days supply | Qty: 16 | Fill #3

## 2023-10-31 MED FILL — PREMARIN 0.625 MG/GRAM VAGINAL CREAM: VAGINAL | 60 days supply | Qty: 30 | Fill #3

## 2023-10-31 MED FILL — MOUNJARO 7.5 MG/0.5 ML SUBCUTANEOUS PEN INJECTOR: SUBCUTANEOUS | 28 days supply | Qty: 2 | Fill #3

## 2023-10-31 MED FILL — CELECOXIB 200 MG CAPSULE: ORAL | 90 days supply | Qty: 180 | Fill #1

## 2023-10-31 MED FILL — SYNTHROID 50 MCG TABLET: ORAL | 90 days supply | Qty: 90 | Fill #1

## 2023-10-31 MED FILL — PREMARIN 0.3 MG TABLET: ORAL | 90 days supply | Qty: 90 | Fill #1

## 2023-10-31 MED FILL — RETIN-A 0.05 % TOPICAL CREAM: TOPICAL | 30 days supply | Qty: 45 | Fill #2

## 2023-11-03 NOTE — Unmapped (Signed)
 ORTHOPAEDIC NEW CLINIC NOTE     ASSESSMENT:  Darlene Lucas is a 53 y.o. female with:  Right cubital tunnel syndrome    PLAN:  We had a good discussion with the patient regarding her symptoms, diagnosis and management options.     Right cubital tunnel syndrome    Mild right cubital tunnel syndrome confirmed on EMG presents with numbness and tingling in the ring and small fingers, difficulty straightening the arm, and dropping objects. Symptoms have persisted since last Christmas, with intermittent episodes prior, and are exacerbated by elbow flexion, especially at night. Previous use of an inadequate brace was ineffective. Surgical intervention may be considered if conservative measures fail, but first she should trial a consistent nighttime extension splinting for at least 3 months. Emphasized keeping the elbow straight at night to prevent nerve irritation. Return for follow-up if symptoms persist.    All questions were answered and the patient is amenable to the plan. We will have the patient follow up as needed.     DME ORDER:  Dx: G56.21, Cubital tunnel syndrome, right  Dispense at Surgery: No  Print for Patient: No  Location: [facility]  Laterality: Right  Body Location: Hand/Wrist/Elbow  Hand/Wrist/Elbow Orthotics: Night time Elbow Extention Brace    Right cubital tunnel syndrome. Already has EMG done., The patient was prescribed this orthosis to be used on the upper extremity for the purpose of reducing pain and providing support and protection.               SUBJECTIVE:  Chief Complaint:  Right upper extremity neuropathy.    History of Present Illness:   Darlene Lucas is a 53 year old female with ankylosing spondylitis and multiple lumbar spine surgeries who presents with numbness and tingling in the right arm. She was referred by Dr Nancyann for evaluation of her arm symptoms.    She has been experiencing numbness and tingling in her right arm, particularly affecting the ring and small fingers. The symptoms have been persistent since last Christmas, with intermittent episodes prior to that. She has difficulty straightening the arm and dropping objects due to numbness.    Her orthopedic history includes seven back surgeries and a diagnosis of ankylosing spondylitis. She states that symptoms have progressed from the lumbar region to the cervical spine, bypassing the thoracic spine. She describes a 'popcorn effect' of symptoms moving up her back, with her cervical spine now affected by osteophytes. She has not had any neck surgeries, and all previous surgeries have been lumbar.    She has been actively working on her posture and has scheduled therapy appointments. She tried using a brace at night although not consistently. She has lost 178 pounds over the last three years and has maintained her current weight for the past year and a half, questioning whether this weight loss could have contributed to her current symptoms.    She has a history of lipoma excision from the right anterior elbow, with a well-healed surgical scar. She experiences complex regional pain syndrome, describing sensations like 'fireworms' and numbness that sometimes resolve with positional changes.      Medical History   Past Medical History[1]     Surgical History   Past Surgical History[2]   Medications   Current Medications[3]   Allergies   Iohexol, Propoxyphene, Shellfish containing products, Sulfa (sulfonamide antibiotics), Topiramate, and Amoxicillin      Social History Social History[4]       Family History The patient's family history includes  Allergies in her father; Angina in her father; Arthritis in her father; Asthma in her daughter, daughter, daughter, daughter, and mother; Cancer in her sister and sister; Clotting disorder in her daughter, daughter, mother, and sister; Diabetes in her daughter, daughter, maternal grandfather, paternal grandfather, paternal grandmother, and sister; Early death in her mother, sister, and sister; Gout in her father; Heart attack in her mother; No Known Problems in her daughter and daughter; Osteoporosis in her brother, father, and sister; Pulmonary embolism in her mother; Scoliosis in her mother, sister, and sister; Stroke in her mother; Thrombophilia in her mother; Thyroid disease in her daughter, daughter, and sister..         Review of Systems No fever or chills.        Physical Exam:  - Examination of the right upper extremity shows she is neuovascularly intact with normal sensation and strength throughout.  She has full and painless range of motion with no instability.  Skin is intact; well-healed surgical scar from lipoma excision on right anterior elbow.  Fingers are warm and well perfused with brisk capillary refill and she has a palpable radial pulse.  There is no tenderness to palpation throughout.  Positive Durkan's at the elbow.      Test Results  Imaging  Images were personally reviewed and interpreted by Dr. Jakie.   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 Right chronic lower cervical radiculopathy without active denervation.         Problem List  Active Problems:    * No active hospital problems. *         [1]   Past Medical History:  Diagnosis Date    Abnormal Pap smear of cervix     Abnormal uterine bleeding     Was told possible adenomyosis, possible need for hysterectomy by Dr. Rojelio. Had US , and trial of 90 days of provera. 11/29/13 TVUS 5x6x4cm RO 2x1.5x1.5 LO 2x2x 1.6, EMS 5mm, abundant flow near endometrium c/w possible adenomyosis     Allergic Age 59    Amblyopia 1974    Anemia     Ankylosing spondylitis of site in spine     2005    Arthritis     Asthma (HHS-HCC)     Blepharitis November 2021    BMI 50.0-59.9, adult (CMS-HCC)     Bursitis 2007    Multiple areas    Chronic back pain     Chronic kidney disease     Constipation     CTS (carpal tunnel syndrome)     Deep vein thrombosis         Diabetes mellitus     2020    Difficult intravenous access     Dry eyes     Dysplasia of cervix, high grade CIN 2     Edema     Elevated LFTs     Environmental allergies     Eye trauma 2020    fractured orbit. Unsure which eye.    Gait abnormality 2003    Uses of a cane as needed    GERD (gastroesophageal reflux disease)     Goiter 2015    monitoring levels    Gout 2019    Headache     Heart murmur 2018    Hyperlipidemia     Hypertension 2021    Hypokalemia     Kidney stone started 1986    [id number]    Osteoarthritis 2003  Osteoporosis     Prediabetes     Pulmonary embolism     1989, 1992    following miscarriages     Recurrent UTI     Right leg pain 10/2017    right fibula fracture    Scoliosis 1974    Spondylolisthesis     Thyroid function study abnormality 2015    seeing endocrine (TSH 5.37 12/25/13, T4 1.02   [2]   Past Surgical History:  Procedure Laterality Date    ANKLE FRACTURE SURGERY  2019    BACK SURGERY      CARPAL TUNNEL RELEASE      ENDOMETRIAL BIOPSY      11/2013    FOOT SURGERY  2019    FRACTURE SURGERY  2007/2019    HYSTERECTOMY      Knee arthroscopy Left 1987    KNEE ARTHROSCOPY      LAMINECTOMY      LUMBAR FUSION      L5 - S1 - multiple - also had hardware removed    LUMBAR FUSION  2008    L1-S1    PR ANKLE SCOPE,EXTENS DEBRIDEMNT Right 08/30/2018    Procedure: ARTHROSCOPY ANK SURG; DEBRID EXTEN - modifier 22;  Surgeon: Fonda Rosalynn Cooler, MD;  [facility];  Service: Orthopedics    PR ARTHROCENTESIS ASPIR&/INJ MAJOR JT/BURSA W/O US  Right 08/30/2018    Procedure: ARTHROCENTESIS, ASPIRATION AND/OR INJECTION; MAJOR JOINT OR BURSA (E.G.SHOULDER/HIP/KNEE);  Surgeon: Fonda Rosalynn Cooler, MD;  [facility];  Service: Orthopedics    PR BIOPSY OF VAGINA,EXTENSIVE N/A 09/20/2014    Procedure: BIOPSY VAGINAL MUCOSA; EXTENSIVE, REQUIRING SUTURE (INCLUDING CYSTS);  Surgeon: Tinnie Cash, MD;  [facility];  Service: Advanced Laparoscopy    PR BIOPSY/EXCISION, LYMPH NODE(S) N/A 09/20/2014    Procedure: BX/EXC LYMPH NODE; OPEN, SUPERF (SEPART PROC);  Surgeon: Tinnie Cash, MD;  Location: [facility] OR [facility];  Service: Advanced Laparoscopy    PR CLOSED TREATMENT PST MALLEOLUS FRACTURE W/O MANIP Right 11/10/2017    Procedure: CLOSED TREATMENT OF POSTERIOR MALLEOLUS FRACTURE; WITHOUT MANIPULATION;  Surgeon: Fonda Rosalynn Cooler, MD;  [facility];  Service: Ortho Foot & Ankle    PR COLPOSCOPY,ENTIRE VAGINA,W/BIOPSY(S) N/A 09/20/2014    Procedure: COLPOSCOPY OF THE ENTIRE VAGINA, WITH CERVIX IF PRESENT; WITH BIOPSY(S) OF VAGINA/CERVIX;  Surgeon: Tinnie Cash, MD;  Location: [facility] OR [facility];  Service: Advanced Laparoscopy    PR COLSC FLX W/RMVL OF TUMOR POLYP LESION SNARE TQ N/A 12/20/2019    Procedure: COLONOSCOPY FLEX; W/REMOV TUMOR/LES BY SNARE;  Surgeon: Burnard Almarie Budd, MD;  [facility];  Service: Gastroenterology    PR CYSTOURETHROSCOPY N/A 06/13/2014    Procedure: CYSTOURETHROSCOPY (SEPARATE PROCEDURE);  Surgeon: Tinnie Cash, MD;  [facility];  Service: Advanced Laparoscopy    PR KNEE SCOPE,MED OR LAT MENIS REPAIR Right 09/15/2021    Procedure: ARTHROSCOPY, KNEE, SURGICAL; WITH MENISCUS REPAIR (MEDIAL OR LATERAL);  Surgeon: Lamar Marsa Hamming, MD;  Location: OR [facility];  Service: Orthopedics    PR LAP,VAG HYST,UTERUS 250GMS/<,SALP-OOPH N/A 06/13/2014    Procedure: ROBOTIC LAPAROSCOPY, SURGICAL, W/VAGINAL HYSTERECTOMY, UTERUS 250 GRAMS OR LESS; W/REMOVAL OF TUBE(S) &/OR OVARY(S);  Surgeon: Tinnie Cash, MD;  Location: [facility] OR [facility];  Service: Advanced Laparoscopy    PR OPEN TREATMENT PROXIMAL FIBULA/SHAFT FRACTURE Right 11/10/2017    Procedure: OPEN TREATMENT OF PROXIMAL FIBULA OR SHAFT FRACTURE, INCLUDES INTERNAL FIXATION, WHEN PERFORMED - Modifier 22;  Surgeon: Fonda  Rosalynn Cooler, MD;  [facility];  Service: Ortho Foot & Ankle    PR OPEN TX DISTAL TIBIOFIBULAR JOINT DISRUPTION Right 11/10/2017    Procedure: OPEN TREATMENT OF DISTAL TIBIOFIBULAR JOINT (SYNDESMOSIS) DISRUPTION, INCLUDES INTERNAL FIXATION, IF DONE;  Surgeon: Fonda Rosalynn Cooler, MD;  [facility];  Service: Ortho Foot & Ankle    PR PELVIC EXAMINATION W ANESTH N/A 09/20/2014    Procedure: PELVIC EXAMINATION UNDER ANESTHESIA (OTHER THAN LOCAL);  Surgeon: Tinnie Cash, MD;  Location: [facility] OR [facility];  Service: Advanced Laparoscopy    PR REMOVAL IMPLANT DEEP Right 08/30/2018    Procedure: R20--REMOVE IMPLANT; DEEP--RIGHT ANKLE x 2 - modifier 22;  Surgeon: Fonda Rosalynn Cooler, MD;  Location: [facility];  Service: Orthopedics    PR REPAIR 1 COLLAT ANKLE LIGMNT,PRIMARY Right 11/10/2017    Procedure: REPAIR, PRIMARY, DISRUPTED LIGAMENT, ANKLE; COLLATERAL;  Surgeon: Fonda Rosalynn Cooler, MD;  Location: [facility] OR [facility];  Service: Ortho Foot & Ankle    PR UPPER GI ENDOSCOPY,BIOPSY N/A 12/20/2019    Procedure: UGI ENDOSCOPY; WITH BIOPSY, SINGLE OR MULTIPLE;  Surgeon: Burnard Almarie Budd, MD;  Location: [facility];  Service: Gastroenterology    SI joint surgery  2004    Bilateral     SPINAL FUSION      TONSILLECTOMY      TUBAL LIGATION     [3]   Current Outpatient Medications   Medication Sig Dispense Refill    aspirin  81 MG chewable tablet Chew 1 tablet (81 mg total) daily. 30 tablet 0    blood sugar diagnostic (ACCU-CHEK GUIDE TEST STRIPS) Strp Use to check blood sugar once daily as directed 100 strip 3    calcium  citrate-vitamin D3 (CITRACAL + D MAXIMUM) 315 mg-6.25 mcg (250 unit) per tablet Take 1 tablet by mouth Two (2) times a day. 60 tablet 1    celecoxib  (CELEBREX ) 200 MG capsule Take 1 capsule (200 mg total) by mouth two (2) times a day as needed for pain. 180 capsule 3    chlorhexidine (PERIDEX) 0.12 % solution RINSE WITH CAPFUL ONCE DAILY THEN SPIT. DO NOT RINSE, EAT, OR DRINK 30 MINUTES AFTER USE      conjugated estrogens  (PREMARIN ) 0.625 mg/gram vaginal cream Insert 0.5 g into the vagina daily. 30 g 11    diclofenac  sodium 1.5 % Drop Apply 40 drops topically four (4) times a day as needed for pain 150 mL 2    estrogens , conjugated, (PREMARIN ) 0.3 MG tablet Take 1 tablet (0.3 mg total) by mouth daily. 90 tablet 1    finasteride  (PROPECIA ) 1 mg tablet Take 1 tablet (1 mg total) by mouth daily. 90 tablet 3    fluticasone  propionate (FLONASE ) 50 mcg/actuation nasal spray Use 1 spray in each nostril once daily. 16 g 3    hydrocortisone  2.5 % ointment Apply to affected area on ears twice daily until improved or for up to 2 weeks, whichever is sooner. Break for 1-2 weeks. Restart as needed. (Patient taking differently: Apply to affected area on ears twice daily until improved or for up to 2 weeks, whichever is sooner. Break for 1-2 weeks. Restart as needed.) 30 g 1    lancets (ACCU-CHEK SOFTCLIX LANCETS) Misc Use to check blood sugar once daily as directed 100 each 3    levothyroxine  (SYNTHROID ) 50 MCG tablet Take 1 tablet (50 mcg total) by mouth daily. 90 tablet 1    metFORMIN  (GLUCOPHAGE -XR) 500 MG 24 hr tablet Take  2 tablets (1,000 mg total) by mouth daily with evening meal. 120 tablet 5    minoxidil  (LONITEN ) 2.5 MG tablet Take 1/2 tablet (1.25 mg total) by mouth daily. 45 tablet 3    montelukast  (SINGULAIR ) 10 mg tablet Take 1 tablet (10 mg total) by mouth daily. 90 tablet 3    polymyxin B  sulf-trimethoprim  (POLYTRIM ) 10,000 unit- 1 mg/mL Drop Administer 1 drop to both eyes every six (6) hours. 10 mL 0    pravastatin  (PRAVACHOL ) 40 MG tablet Take 1 tablet (40 mg total) by mouth nightly. 90 tablet 1    spironolactone  (ALDACTONE ) 100 MG tablet Take 1 tablet (100 mg total) by mouth two (2) times a day. 180 tablet 3    tirzepatide  (MOUNJARO ) 7.5 mg/0.5 mL PnIj Inject 7.5 mg subcutaneously every 7 days 2 mL 5    traMADol  (ULTRAM ) 50 mg tablet Take 1 tablet (50 mg total) by mouth every six (6) hours as needed for pain. (Patient not taking: Reported on 10/07/2023) 40 tablet 0    tretinoin  (RETIN-A ) 0.05 % cream Apply a pea sized amount at night 2-3 times a week x 2 weeks, then every other night for 2 weeks, then nightly. If getting dry, use a facial moisturizer in the morning such as Oil of Olay complete sensitive skin or CereVe AM 45 g 4    VENTOLIN  HFA 90 mcg/actuation inhaler Inhale 2 puffs every six (6) hours as needed for wheezing. 54 g 1     No current facility-administered medications for this visit.   [4]   Social History  Socioeconomic History    Marital status: Widowed   Tobacco Use    Smoking status: Never    Smokeless tobacco: Never   Vaping Use    Vaping status: Never Used   Substance and Sexual Activity    Alcohol  use: Not Currently     Alcohol /week: 0.0 standard drinks of alcohol      Comment: rare    Drug use: Never    Sexual activity: Yes     Partners: Male     Birth control/protection: Bilateral Tubal Ligation   Other Topics Concern    Do you use sunscreen? No    Tanning bed use? Yes    Are you easily burned? No    Excessive sun exposure? No    Blistering sunburns? No   Social History Narrative    Widowed, 07/05/2014    Has 2 children- ages 18, 44.    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                             Social Drivers of Psychologist, prison and probation services Strain: Low Risk  (04/22/2022)    Overall Financial Resource Strain (CARDIA)     Difficulty of Paying Living Expenses: Not very hard   Food Insecurity: No Food Insecurity (07/08/2023)    Hunger Vital Sign     Worried About Running Out of Food in the Last Year: Never true     Ran Out of Food in the Last Year: Never true   Transportation Needs: No Transportation Needs (07/08/2023)    PRAPARE - Transportation     Lack of Transportation (Medical): No     Lack of Transportation (Non-Medical): No   Stress: No Stress Concern Present (07/05/2019)  [employer] of Occupational Health - Occupational Stress Questionnaire     Feeling of Stress : Only a little   Housing: Low Risk  (07/08/2023)    Housing     Within the past 12 months, have you ever stayed: outside, in a car, in a tent, in an overnight shelter, or temporarily in someone else's home (i.e. couch-surfing)?: No     Are you worried about losing your housing?: No

## 2023-11-04 ENCOUNTER — Ambulatory Visit
Admit: 2023-11-04 | Discharge: 2023-11-05 | Payer: Medicaid (Managed Care) | Attending: Orthopaedic Surgery | Primary: Orthopaedic Surgery

## 2023-12-02 DIAGNOSIS — J4520 Mild intermittent asthma, uncomplicated: Principal | ICD-10-CM

## 2023-12-02 MED ORDER — VENTOLIN HFA 90 MCG/ACTUATION AEROSOL INHALER
Freq: Four times a day (QID) | RESPIRATORY_TRACT | 1 refills | 75.00000 days | Status: CP | PRN
Start: 2023-12-02 — End: 2024-12-01
  Filled 2023-12-06: qty 54, 75d supply, fill #0

## 2023-12-02 NOTE — Unmapped (Signed)
 Patient is requesting the following refill  Requested Prescriptions     Pending Prescriptions Disp Refills    VENTOLIN  HFA 90 mcg/actuation inhaler 54 g 1     Sig: Inhale 2 puffs every six (6) hours as needed for wheezing.       Recent Visits  Date Type Provider Dept   10/13/23 Office Visit Lora Alfonso HERO, FNP [facility] [address] [facility]   07/08/23 Office Visit Camelia, Sherwood Sieving, NP [facility] [address] [facility]   04/12/23 Telemedicine Farrug, Sherwood Sieving, NP [facility] [address] [facility]   04/07/23 Telemedicine Tobie, Buster Opoka, FNP [facility]   03/04/23 Telemedicine Geralene Levorn Geralds, FNP [facility] [address] At [facility]   02/25/23 Office Visit Camelia, Sherwood Sieving, NP [facility] [facility] [address] [facility]   02/09/23 Telemedicine Farrug, Sherwood Sieving, NP [place] [facility] [address] At [facility]   Showing recent visits within past 365 days and meeting all other requirements  Future Appointments  Date Type Provider Dept   12/21/23 Appointment Lora Alfonso HERO, FNP [place] [facility] [address] At [facility]   Showing future appointments within next 365 days and meeting all other requirements       Labs: Not applicable this refill

## 2023-12-06 MED FILL — RETIN-A 0.05 % TOPICAL CREAM: TOPICAL | 30 days supply | Qty: 45 | Fill #3

## 2023-12-06 MED FILL — PRAVASTATIN 40 MG TABLET: ORAL | 90 days supply | Qty: 90 | Fill #1

## 2023-12-06 MED FILL — MOUNJARO 7.5 MG/0.5 ML SUBCUTANEOUS PEN INJECTOR: SUBCUTANEOUS | 28 days supply | Qty: 2 | Fill #4

## 2023-12-07 DIAGNOSIS — H01009 Unspecified blepharitis unspecified eye, unspecified eyelid: Principal | ICD-10-CM

## 2023-12-07 MED ORDER — POLYMYXIN B SULFATE 10,000 UNIT-TRIMETHOPRIM 1 MG/ML EYE DROPS
Freq: Four times a day (QID) | OPHTHALMIC | 0 refills | 50.00000 days
Start: 2023-12-07 — End: ?

## 2023-12-07 NOTE — Unmapped (Signed)
 Patient is requesting the following refill  Requested Prescriptions     Pending Prescriptions Disp Refills    polymyxin B  sulf-trimethoprim  (POLYTRIM ) 10,000 unit- 1 mg/mL Drop 10 mL 0     Sig: Administer 1 drop to both eyes every six (6) hours.       Recent Visits  Date Type Provider Dept   10/13/23 Office Visit Lora Alfonso HERO, FNP [facility] [address] [facility]   07/08/23 Office Visit Camelia, Sherwood Sieving, NP [facility] [facility] [address] [facility]   04/12/23 Telemedicine Farrug, Sherwood Sieving, NP [place] [facility] [address] [facility]   04/07/23 Telemedicine Tobie, Buster Opoka, FNP [facility]   03/04/23 Telemedicine Geralene Levorn Geralds, FNP [place] [facility] [address] [facility]   02/25/23 Office Visit Camelia, Sherwood Sieving, NP [place] [facility] [address] [facility]   02/09/23 Telemedicine Farrug, Sherwood Sieving, NP [place] [facility] [address] [facility]   Showing recent visits within past 365 days and meeting all other requirements  Future Appointments  Date Type Provider Dept   12/21/23 Appointment Lora Alfonso HERO, FNP [place] [facility] [address] [facility]   Showing future appointments within next 365 days and meeting all other requirements       Labs: Not applicable this refill

## 2023-12-07 NOTE — Unmapped (Signed)
 Needs OV.

## 2023-12-13 DIAGNOSIS — H01009 Unspecified blepharitis unspecified eye, unspecified eyelid: Principal | ICD-10-CM

## 2023-12-13 DIAGNOSIS — L219 Seborrheic dermatitis, unspecified: Principal | ICD-10-CM

## 2023-12-13 DIAGNOSIS — L658 Other specified nonscarring hair loss: Principal | ICD-10-CM

## 2023-12-13 DIAGNOSIS — L720 Epidermal cyst: Principal | ICD-10-CM

## 2023-12-13 DIAGNOSIS — L719 Rosacea, unspecified: Principal | ICD-10-CM

## 2023-12-13 MED ORDER — FINASTERIDE 1 MG TABLET
ORAL_TABLET | Freq: Every day | ORAL | 3 refills | 90.00000 days | Status: CP
Start: 2023-12-13 — End: 2024-12-12
  Filled 2023-12-19: qty 90, 90d supply, fill #0

## 2023-12-13 MED ORDER — TRETINOIN 0.05 % TOPICAL CREAM
TOPICAL | 4 refills | 0.00000 days | Status: CP
Start: 2023-12-13 — End: ?
  Filled 2024-01-11: qty 45, 30d supply, fill #0

## 2023-12-13 MED ORDER — POLYMYXIN B SULFATE 10,000 UNIT-TRIMETHOPRIM 1 MG/ML EYE DROPS
Freq: Four times a day (QID) | OPHTHALMIC | 0 refills | 50.00000 days | Status: CP
Start: 2023-12-13 — End: ?

## 2023-12-13 MED ORDER — HYDROCORTISONE 2.5 % TOPICAL OINTMENT
TOPICAL | 1 refills | 0.00000 days | Status: CP
Start: 2023-12-13 — End: ?
  Filled 2024-01-03: qty 56.7, 30d supply, fill #0

## 2023-12-13 MED ORDER — SPIRONOLACTONE 100 MG TABLET
ORAL_TABLET | Freq: Two times a day (BID) | ORAL | 3 refills | 90.00000 days | Status: CP
Start: 2023-12-13 — End: 2024-06-10
  Filled 2024-01-11: qty 180, 90d supply, fill #0

## 2023-12-13 MED ORDER — MINOXIDIL 2.5 MG TABLET
ORAL_TABLET | Freq: Every day | ORAL | 3 refills | 90.00000 days | Status: CP
Start: 2023-12-13 — End: 2024-12-12
  Filled 2023-12-19: qty 45, 90d supply, fill #0

## 2023-12-13 MED ORDER — KETOCONAZOLE 2 % TOPICAL CREAM
Freq: Two times a day (BID) | TOPICAL | 11 refills | 30.00000 days | Status: CP
Start: 2023-12-13 — End: 2024-12-12
  Filled 2023-12-19: qty 30, 30d supply, fill #0

## 2023-12-13 NOTE — Unmapped (Signed)
 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   - Continue spironolactone  to 200 mg daily (09/2023 K, BUN, Cr wnl, pt is menopausal).   - Continue oral minoxidil  1.25 mg daily. Risks: do not use in patients with CHF, valve disease, poorly controlled hypertension, pulmonary hypertension. Patient does not have a history of CHF or valve disease. She has well controlled essential hypertension (per documentation by PCP 08/2022)  - Continue finasteride  (PROPECIA ) 1 mg tablet; Take 1 tablet (1 mg total) by mouth daily.  Patient currently abstinent and has had hysterectomy.      Seborrheic Dermatitis   - Diagnosis, treatment options, prognosis, risk/ benefit, and side effects of treatment were discussed with the patient.   - Start ketoconazole  (NIZORAL ) 2 % cream; Apply 1 Application topically two (2) times a day.  Dispense: 30 g; Refill: 11  - Continue hydrocortisone  2.5 % ointment; Apply to affected area on ears twice daily until improved or for up to 2 weeks, whichever is sooner. Break for 1-2 weeks. Restart as needed.     Rosacea  - Discussed potential triggers including caffeine, heat, sun exposure, chocolate, etc. and recommended patient attempt to identify and avoid triggers  - Sun avoidance, protective clothing sunscreen discussed  - Telangiectasias will likely not fade completely, consult a private dermatologist or aesthetician for laser removal of these superficial vessels  - Per patient preference, continue tretinoin  (RETIN-A ) 0.05 % cream; Apply a pea sized amount at night 2-3 times a week x 2 weeks, then every other night for 2 weeks, then nightly. If getting dry, use a facial moisturizer in the morning such as Oil of Olay complete sensitive skin or CereVe AM  Dispense: 45 g; Refill: 4      The patient was advised to call for an appointment should any new, changing, or symptomatic lesions develop.     RTC: Return in about 1 year (around 12/12/2024). or sooner as needed   _________________________________________________________________      Chief Complaint     Chief Complaint   Patient presents with    Skin Check     6 month follow up/ hair loss       HPI     Darlene Lucas is a 53 y.o. female who presents as a returning patient (last seen 06/09/2023) to Dermatology for follow up of hair loss.     History of Present Illness  Darlene Lucas is a 53 year old female who presents with concerns about hair regrowth and skin issues.    She notes improvement in hair regrowth, particularly in areas that were previously sparse, and attributes past hair loss to wearing a tight bun hairstyle. She continues to use spironolactone , finasteride , and minoxidil , which have been effective in stopping hair loss.     She uses antifungal, antibacterial, and yeast drops, which help manage blepharitis. Hydrocortisone  is used to control flare-ups, particularly around the eyes, and is effective when combined with eye drops.    She has a recurring issue with yeast, particularly in the ears and around the nose, managed with over-the-counter yeast cream. Stress and temperature extremes exacerbate her symptoms.     She has a history of rosacea, with redness and broken blood vessels. Systemic steroids for respiratory issues help reduce inflammation and redness.  The patient denies any other new or changing lesions or areas of concern.     Pertinent Past Medical History       Problem List    None      Past Medical History, Family History, Social History, Medication List, Allergies, and Problem List were reviewed in the rooming section of Epic.     ROS: Other than symptoms mentioned in the HPI, no fevers, chills, or other skin complaints    Physical Examination     GENERAL: Well-appearing female in no acute distress, resting comfortably.  NEURO: Alert and oriented, answers questions appropriately  PSYCH: Normal mood and affect  SKIN: Examination of the hair, scalp, ears, face, neck, chest, and back was performed  - mild thinning of hair mainly centered on occipital scalp and part line  stable from prior. Negative hair pull test  - ears and face clear of rash  - scattered telangiectasias with background erythema of cheeks, chin, nose     All areas not commented on are within normal limits or unremarkable.           (Approved Template 12/03/2019)

## 2023-12-13 NOTE — Unmapped (Signed)
Need to discuss at appointment

## 2023-12-13 NOTE — Unmapped (Addendum)
 Patient left stating she requested a refill for polymyxin last week. Patient states her eye has not gotten better because she has ran out of eye drops states this a recurring problem. Patient is scheduled for 10/01 and would like to know if are willing to refill before her eye gets worse. Please advice.     4:06 PM     Prescription was sent to [facility] in [facility] in [place]  Please let us  know if you have questions, concerns.  Thank you,  AM

## 2023-12-13 NOTE — Unmapped (Signed)
 Meet your team:     Your nurse is: Coralee North    Please remember to fill out the survey you will receive after your visit. Your comments help [place] continue to improve our care.      Thanks in advance!      [facility] Clinical Staff

## 2023-12-19 NOTE — Unmapped (Signed)
 Darlene Lucas is a 53 y.o. female who presents for the evaluation of aural fullness and otalgia. She reports chronic, bilateral, aural fullness, pressure and otalgia. She experiences ear pressure and pain, described as a sensation of popping and fluid retention. This has been a long-standing issue since infancy, with a history of ear congestion and fluid problems. She has tried various treatments, including allergy shots and benzocaine ear drops, which were effective for pain but are no longer available. She currently takes Zyrtec D 24 for allergies and Flonase  for nasal congestion. Ear pain sometimes improves with ear popping but often feels like 'someone's pointing a laser through' her ear. The pain is distinct from her TMJ-related discomfort, which she manages with a mouth guard at night. She has a history of ear tube placements at ages [age] and [age], with an unsuccessful attempt at age 57. She experiences tinnitus, described as a 'kazoo' sound, with the left ear often worse than the right. She reports that she produces earwax and cleans her ears regularly. She had an RSV infection about eighteen months ago, which she feels has contributed to her current symptoms. 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 She has a significant history of inflammation-related issues, including arthritis and seven back surgeries, which she feels contribute to her overall inflammatory state. She underwent audiogram on 02/22/23 that demonstrated a mild sensorineural hearing loss bilaterally. 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 She has had ear tubes placed previously. She underwent a hearing aid evaluation on 10/19/23.     She was accompanied by a support animal to today's visit.    Past Medical History  She  has a past medical history of Abnormal Pap smear of cervix, Abnormal uterine bleeding, Allergic (Age 36), Amblyopia (1974), Anemia, Ankylosing spondylitis of site in spine    (CMS-HCC) (2005), Arthritis, Asthma (HHS-HCC), Blepharitis (November 2021), BMI 50.0-59.9, adult (CMS-HCC), Bursitis (2007), Chronic back pain, Chronic kidney disease, Constipation, CTS (carpal tunnel syndrome), Deep vein thrombosis    (CMS-HCC), Diabetes mellitus    (CMS-HCC) (2020), Difficult intravenous access, Dry eyes, Dysplasia of cervix, high grade CIN 2, Edema, Elevated LFTs, Environmental allergies, Eye trauma (2020), Gait abnormality (2003), GERD (gastroesophageal reflux disease), Goiter (2015), Gout (2019), Headache, Heart murmur (2018), Hyperlipidemia, Hypertension (2021), Hypokalemia, Kidney stone (started 1986), Osteoarthritis (2003), Osteoporosis, Prediabetes, Pulmonary embolism    (CMS-HCC) (1989, 1992), Recurrent UTI, Right leg pain (10/2017), Scoliosis (1974), Spondylolisthesis, and Thyroid function study abnormality (2015).    Past Surgical History  Her  has a past surgical history that includes Tubal ligation; Endometrial biopsy; Knee arthroscopy (Left, 1987); Tonsillectomy; SI joint surgery (2004); Lumbar fusion; Lumbar fusion (2008); pr lap,vag hyst,uterus 250gms/<,salp-ooph (N/A, 06/13/2014); pr cystourethroscopy (N/A, 06/13/2014); Hysterectomy; pr pelvic examination w anesth (N/A, 09/20/2014); pr biopsy of vagina,extensive (N/A, 09/20/2014); pr colposcopy,entire vagina,w/biopsy(s) (N/A, 09/20/2014); pr biopsy/excision, lymph node(s) (N/A, 09/20/2014); pr open treatment proximal fibula/shaft fracture (Right, 11/10/2017); pr open tx distal tibiofibular joint disruption (Right, 11/10/2017); pr repair 1 collat ankle ligmnt,primary (Right, 11/10/2017); pr closed treatment pst malleolus fracture w/o manip (Right, 11/10/2017); pr removal implant deep (Right, 08/30/2018); pr ankle scope,extens debridemnt (Right, 08/30/2018); pr arthrocentesis aspir&/inj major jt/bursa w/o us  (Right, 08/30/2018); pr upper gi endoscopy,biopsy (N/A, 12/20/2019); pr colsc flx w/rmvl of tumor polyp lesion snare tq (N/A, 12/20/2019); pr knee scope,med or lat menis repair (Right, 09/15/2021); Fracture surgery (2007/2019); Laminectomy; Spinal fusion; Carpal tunnel release; Ankle fracture surgery (2019); Back surgery; Foot surgery (  2019); and Knee arthroscopy.    Past Family History  Her family history includes Allergies in her father; Angina in her father; Arthritis in her father; Asthma in her daughter, daughter, daughter, daughter, and mother; Cancer in her sister and sister; Clotting disorder in her daughter, daughter, mother, and sister; Diabetes in her daughter, daughter, maternal grandfather, paternal grandfather, paternal grandmother, and sister; Early death in her mother, sister, and sister; Gout in her father; Heart attack in her mother; No Known Problems in her daughter and daughter; Osteoporosis in her brother, father, and sister; Pulmonary embolism in her mother; Scoliosis in her mother, sister, and sister; Stroke in her mother; Thrombophilia in her mother; Thyroid disease in her daughter, daughter, and sister.    Past Social History  She  reports that she has never smoked. She has never used smokeless tobacco. She reports that she does not currently use alcohol . She reports that she does not use drugs.    Medications/Allergies  Her current medication(s) include:    Current Outpatient Medications:     aspirin  81 MG chewable tablet, Chew 1 tablet (81 mg total) daily., Disp: 30 tablet, Rfl: 0    blood sugar diagnostic (ACCU-CHEK GUIDE TEST STRIPS) Strp, Use to check blood sugar once daily as directed, Disp: 100 strip, Rfl: 3    calcium  citrate-vitamin D3 (CITRACAL + D MAXIMUM) 315 mg-6.25 mcg (250 unit) per tablet, Take 1 tablet by mouth Two (2) times a day., Disp: 60 tablet, Rfl: 1    celecoxib  (CELEBREX ) 200 MG capsule, Take 1 capsule (200 mg total) by mouth two (2) times a day as needed for pain., Disp: 180 capsule, Rfl: 3    chlorhexidine (PERIDEX) 0.12 % solution, RINSE WITH CAPFUL ONCE DAILY THEN SPIT. DO NOT RINSE, EAT, OR DRINK 30 MINUTES AFTER USE, Disp: , Rfl:     conjugated estrogens  (PREMARIN ) 0.625 mg/gram vaginal cream, Insert 0.5 g into the vagina daily., Disp: 30 g, Rfl: 11    diclofenac  sodium 1.5 % Drop, Apply 40 drops topically four (4) times a day as needed for pain, Disp: 150 mL, Rfl: 2    estrogens , conjugated, (PREMARIN ) 0.3 MG tablet, Take 1 tablet (0.3 mg total) by mouth daily., Disp: 90 tablet, Rfl: 1    finasteride  (PROPECIA ) 1 mg tablet, Take 1 tablet (1 mg total) by mouth daily., Disp: 90 tablet, Rfl: 3    fluticasone  propionate (FLONASE ) 50 mcg/actuation nasal spray, Use 1 spray in each nostril once daily., Disp: 16 g, Rfl: 3    hydrocortisone  2.5 % ointment, Apply to affected area on ears twice daily until improved or for up to 2 weeks, whichever is sooner. Break for 1-2 weeks. Restart as needed., Disp: 30 g, Rfl: 1    ketoconazole  (NIZORAL ) 2 % cream, Apply 1 Application topically two (2) times a day., Disp: 30 g, Rfl: 11    lancets (ACCU-CHEK SOFTCLIX LANCETS) Misc, Use to check blood sugar once daily as directed, Disp: 100 each, Rfl: 3    levothyroxine  (SYNTHROID ) 50 MCG tablet, Take 1 tablet (50 mcg total) by mouth daily., Disp: 90 tablet, Rfl: 1    metFORMIN  (GLUCOPHAGE -XR) 500 MG 24 hr tablet, Take 2 tablets (1,000 mg total) by mouth daily with evening meal., Disp: 120 tablet, Rfl: 5    minoxidil  (LONITEN ) 2.5 MG tablet, Take 1/2 tablet (1.25 mg total) by mouth daily., Disp: 45 tablet, Rfl: 3    montelukast  (SINGULAIR ) 10 mg tablet, Take 1 tablet (10 mg total) by mouth daily.,  Disp: 90 tablet, Rfl: 3    polymyxin B  sulf-trimethoprim  (POLYTRIM ) 10,000 unit- 1 mg/mL Drop, Administer 1 drop to both eyes every six (6) hours., Disp: 10 mL, Rfl: 0    pravastatin  (PRAVACHOL ) 40 MG tablet, Take 1 tablet (40 mg total) by mouth nightly., Disp: 90 tablet, Rfl: 1    spironolactone  (ALDACTONE ) 100 MG tablet, Take 1 tablet (100 mg total) by mouth two (2) times a day., Disp: 180 tablet, Rfl: 3 tirzepatide  (MOUNJARO ) 7.5 mg/0.5 mL PnIj, Inject 7.5 mg subcutaneously every 7 days, Disp: 2 mL, Rfl: 5    traMADol  (ULTRAM ) 50 mg tablet, Take 1 tablet (50 mg total) by mouth every six (6) hours as needed for pain. (Patient not taking: Reported on 10/07/2023), Disp: 40 tablet, Rfl: 0    tretinoin  (RETIN-A ) 0.05 % cream, Apply a pea sized amount at night 2-3 times a week x 2 weeks, then every other night for 2 weeks, then nightly. If getting dry, use a facial moisturizer in the morning such as Oil of Olay complete sensitive skin or CereVe AM, Disp: 45 g, Rfl: 4    VENTOLIN  HFA 90 mcg/actuation inhaler, Inhale 2 puffs every six (6) hours as needed for wheezing., Disp: 54 g, Rfl: 1  Allergies: Iohexol, Propoxyphene, Shellfish containing products, Sulfa (sulfonamide antibiotics), Topiramate, and Amoxicillin ,    Review of systems   Review of systems was reviewed on attached notes/patient intake forms.      Physical Examination  LMP 05/03/2017     General: well appearing, stated age, no distress   Head - atraumatic, normocephalic. Tenderness to palpation with crepitus over both TMJ  Nose: dorsum midline, no rhinorrhea  Neck: symmetric, trachea midline  Psychiatric: alert and oriented, appropriate mood and affect   Respiratory: no audible wheezing or stridor, normal work of breathing  Neurologic - cranial nerves 2-12 grossly intact  Facial Strength - HB 1/6 bilaterally    Ears - External ear- normal, no lesions, no malformations   Otoscopy - clear EAC, TM intact with myringosclerosis, clear middle ear space.     Audiogram  Audiogram was independently reviewed and interpreted.  This demonstrates a mild sensorineural hearing loss rising to normal sloping to mild sensorineural hearing loss bilaterally. WRS 96% bilaterally.       Tympanogram  Type A bilaterally     Imaging  None.    I reviewed medical records scanned into media, pertinent findings are summarized in the HPI.    Assessment and Plan  The patient is a 53 y.o. female with otalgia, bilateral aural fullness and allergic rhinitis. Otalgia seems most likely secondary to TMJ arthralgia, no obvious ear etiology. Prescribe steroid ear drops and provide a saline rinse bottle for nasal irrigation to help with allergic rhinitis. Schedule follow-up in three months. Consider Eustachian tube dilation or ear tube placement if symptoms persist.    The patient/family voiced understanding of the plan as detailed above and is in agreement. I appreciate the opportunity to participate in her care.    I attest to the above information and documentation. However, this note has been created using voice recognition software and may have errors that were not dictated and not seen in editing.    RONAL Joesph Luster MD  Assistant Professor   Division of Otology/Neurotology  [facility] of [employer], [place], [place]

## 2023-12-20 ENCOUNTER — Ambulatory Visit: Admit: 2023-12-20 | Discharge: 2023-12-21 | Payer: Medicaid (Managed Care)

## 2023-12-20 MED ADMIN — triamcinolone acetonide (KENALOG-40) injection 40 mg: 40 mg | INTRA_ARTICULAR | @ 21:00:00 | Stop: 2023-12-20

## 2023-12-20 NOTE — Unmapped (Signed)
 Encounter Provider: Delon Donal Hoover, NP  Date of Service: 12/20/2023 Last encounter Orthopaedics: 11/04/2023   Last encounter this provider: 09/20/2023      Notes:     Primary Care Provider: Lora Alfonso HERO, FNP  Referring Provider: None Per Patient Pcp    ICD-10-CM   1. Arthritis of right subtalar joint  M19.071    Orthopaedic notes: - s/p right fibula shaft & syndesmosis ORIF and deltoid suture anchor repair on 11/10/17 (Attending: [facility])  - s/p right ankle arthroscopic debridement and removal of broken syndesmotic screw on 08/30/2018 (Attending: [facility])    Physical Function CAT Score: (not recorded)  Pain Interference CAT Score: (not recorded)  Depression CAT Score: (not recorded)  Sleep CAT Score: (not recorded)  [email or web address]     Darlene Lucas is a 53 y.o. female     ASSESSMENT & PLAN       Assessment/Plan:     Right subtalar joint osteoarthritis  - Administer subtalar steroid injection today.  - Advise to resume activities as tolerated.  - Schedule follow-up appointment in early January.  - No imaging required prior to the next exam.          Risks of procedure were discussed.  The patient verbalized understanding and provided consent.  A timeout was performed prior to procedure identifying the correct patient, location, medication.  4 ml lidocaine  and 40 mg kenalog  were injected into the R subtalar joint using aseptic technique.  The patient tolerated this well.   Advised to avoid vigorous activities for the next few days and then the patient may proceed with activity as tolerated.   Requested Prescriptions      No prescriptions requested or ordered in this encounter      No orders of the defined types were placed in this encounter.      History:  Reason for visit: foot pain    HPI:  History of Present Illness    Darlene Lucas is a 53 year old female with right subtalar osteoarthritis who presents with worsening pain and swelling.    She has a history of right subtalar osteoarthritis and received her last steroid injection on September 20, 2023. Over the past couple of weeks, she has experienced worsening symptoms, including increased pain and swelling in the affected area. The pain is severe, and there is significant swelling, particularly in specific areas.    Despite continuing her exercises, she experiences persistent pain. She has started using a compression garment to manage the swelling.             Medical History Past Medical History[1]   Surgical History Past Surgical History[2]   Allergies Iohexol, Propoxyphene, Shellfish containing products, Sulfa (sulfonamide antibiotics), Topiramate, and Amoxicillin    Medications She has a current medication list which includes the following prescription(s): aspirin , accu-chek guide test strips, calcium  citrate-vitamin d3, celecoxib , chlorhexidine, premarin , diclofenac  sodium, estrogens  (conjugated), finasteride , fluticasone  propionate, hydrocortisone , ketoconazole , lancets, levothyroxine , metformin , minoxidil , montelukast , polymyxin b  sulf-trimethoprim , pravastatin , spironolactone , mounjaro , tramadol , tretinoin , and ventolin  hfa.   Family History Her family history includes Allergies in her father; Angina in her father; Arthritis in her father; Asthma in her daughter, daughter, daughter, daughter, and mother; Cancer in her sister and sister; Clotting disorder in her daughter, daughter, mother, and sister; Diabetes in her daughter, daughter, maternal grandfather, paternal grandfather, paternal grandmother, and sister; Early death in her mother, sister, and sister; Gout in her father; Heart attack in her mother; No Known Problems in her  daughter and daughter; Osteoporosis in her brother, father, and sister; Pulmonary embolism in her mother; Scoliosis in her mother, sister, and sister; Stroke in her mother; Thrombophilia in her mother; Thyroid disease in her daughter, daughter, and sister.   Social History She reports that she has never smoked. She has never used smokeless tobacco. She reports that she does not currently use alcohol . She reports that she does not use drugs.Home address:[address] [place] [address] [place] [address]  Occupation:         Occupational History    Not on file     Social History     Social History Narrative    Widowed, 07/05/2014    Has 2 children- ages 6, 81.    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                                    Exam:  The encounter diagnosis was Arthritis of right subtalar joint.   Estimated body mass index is 26.02 kg/m?? as calculated from the following:    Height as of 10/07/23: 154.9 cm (5' 1).    Weight as of 10/13/23: 62.5 kg (137 lb 11.2 oz).        Physical Exam   Physical Exam    CARDIOVASCULAR: Brisk capillary refill. Dorsalis pedis pulse 2+.  MUSCULOSKELETAL: Sinus tarsi tenderness. Subtalar range of motion limited with discomfort. Gait even and steady.  SKIN: Skin normal.                 Test Results  The encounter diagnosis was Arthritis of right subtalar joint.  Lab Results   Component Value Date    A1C 5.1 10/13/2023       No results found for: VITD    Imaging  No orders of the defined types were placed in this encounter.        DME ORDER:  Dx:  ,                 [1]   Past Medical History:  Diagnosis Date    Abnormal Pap smear of cervix     Abnormal uterine bleeding     Was told possible adenomyosis, possible need for hysterectomy by Dr. Rojelio. Had US , and trial of 90 days of provera. 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 Unsure which eye.    Gait abnormality 2003    Uses of a cane as needed    GERD (gastroesophageal reflux disease)     Goiter 2015    monitoring levels    Gout 2019    Headache     Heart murmur 2018    Hyperlipidemia     Hypertension 2021    Hypokalemia     Kidney stone started 1986    [id number]    Osteoarthritis 2003    Osteoporosis     Prediabetes     Pulmonary embolism    (CMS-HCC) 1989, 1992    following miscarriages     Recurrent UTI     Right leg pain 10/2017    right fibula fracture    Scoliosis 1974    Spondylolisthesis     Thyroid function study abnormality 2015    seeing endocrine (TSH 5.37 12/25/13, T4 1.02   [2]   Past Surgical History:  Procedure Laterality Date    ANKLE FRACTURE SURGERY  2019    BACK SURGERY      CARPAL TUNNEL RELEASE      ENDOMETRIAL BIOPSY      11/2013    FOOT SURGERY  2019    FRACTURE SURGERY  2007/2019    HYSTERECTOMY      Knee arthroscopy Left 1987    KNEE ARTHROSCOPY      LAMINECTOMY      LUMBAR FUSION      L5 - S1 - multiple - also had hardware removed    LUMBAR FUSION  2008    L1-S1    PR ANKLE SCOPE,EXTENS DEBRIDEMNT Right 08/30/2018    Procedure: ARTHROSCOPY ANK SURG; DEBRID EXTEN - modifier 22;  Surgeon: Fonda Rosalynn Cooler, MD;  Location: [facility] OR [facility];  Service: Orthopedics    PR ARTHROCENTESIS ASPIR&/INJ MAJOR JT/BURSA W/O US  Right 08/30/2018    Procedure: ARTHROCENTESIS, ASPIRATION AND/OR INJECTION; MAJOR JOINT OR BURSA (E.G.SHOULDER/HIP/KNEE);  Surgeon: Fonda Rosalynn Cooler, MD;  [facility]: [facility] OR [facility];  Service: Orthopedics    PR BIOPSY OF VAGINA,EXTENSIVE N/A 09/20/2014    Procedure: BIOPSY VAGINAL MUCOSA; EXTENSIVE, REQUIRING SUTURE (INCLUDING CYSTS);  Surgeon: Tinnie Cash, MD;  [facility];  Service: Advanced Laparoscopy    PR BIOPSY/EXCISION, LYMPH NODE(S) N/A 09/20/2014    Procedure: BX/EXC LYMPH NODE; OPEN, SUPERF (SEPART PROC);  Surgeon: Tinnie Cash, MD;  [facility] OR [facility];  Service: Advanced Laparoscopy    PR CLOSED TREATMENT PST MALLEOLUS FRACTURE W/O MANIP Right 11/10/2017    Procedure: CLOSED TREATMENT OF POSTERIOR MALLEOLUS FRACTURE; WITHOUT MANIPULATION;  Surgeon: Fonda Rosalynn Cooler, MD;  Location: [facility];  Service: Ortho Foot & Ankle    PR COLPOSCOPY,ENTIRE VAGINA,W/BIOPSY(S) N/A 09/20/2014    Procedure: COLPOSCOPY OF THE ENTIRE VAGINA, WITH CERVIX IF PRESENT; WITH BIOPSY(S) OF VAGINA/CERVIX;  Surgeon: Tinnie Cash, MD;  Location: [facility];  Service: Advanced Laparoscopy    PR COLSC FLX W/RMVL OF TUMOR POLYP LESION SNARE TQ N/A 12/20/2019    Procedure: COLONOSCOPY FLEX; W/REMOV TUMOR/LES BY SNARE;  Surgeon: Burnard Almarie Budd, MD;  [facility]: [facility];  Service: Gastroenterology    PR CYSTOURETHROSCOPY N/A 06/13/2014    Procedure: CYSTOURETHROSCOPY (SEPARATE PROCEDURE);  Surgeon: Tinnie Cash, MD;  Location: [facility] OR [facility];  Service: Advanced Laparoscopy    PR KNEE SCOPE,MED OR LAT MENIS REPAIR Right 09/15/2021    Procedure: ARTHROSCOPY, KNEE, SURGICAL; WITH MENISCUS REPAIR (  MEDIAL OR LATERAL);  Surgeon: Lamar Marsa Hamming, MD;  Location: OR [facility];  Service: Orthopedics    PR LAP,VAG HYST,UTERUS 250GMS/<,SALP-OOPH N/A 06/13/2014    Procedure: ROBOTIC LAPAROSCOPY, SURGICAL, W/VAGINAL HYSTERECTOMY, UTERUS 250 GRAMS OR LESS; W/REMOVAL OF TUBE(S) &/OR OVARY(S);  Surgeon: Tinnie Cash, MD;  Location: [facility] OR [facility];  Service: Advanced Laparoscopy    PR OPEN TREATMENT PROXIMAL FIBULA/SHAFT FRACTURE Right 11/10/2017    Procedure: OPEN TREATMENT OF PROXIMAL FIBULA OR SHAFT FRACTURE, INCLUDES INTERNAL FIXATION, WHEN PERFORMED - Modifier 22;  Surgeon: Fonda Rosalynn Cooler, MD;  Location: [facility];  Service: Ortho Foot & Ankle    PR OPEN TX DISTAL TIBIOFIBULAR JOINT DISRUPTION Right 11/10/2017    Procedure: OPEN TREATMENT OF DISTAL TIBIOFIBULAR JOINT (SYNDESMOSIS) DISRUPTION, INCLUDES INTERNAL FIXATION, IF DONE;  Surgeon: Fonda Rosalynn Cooler, MD;  Location: [facility] OR [facility];  Service: Ortho Foot & Ankle    PR PELVIC EXAMINATION W ANESTH N/A 09/20/2014    Procedure: PELVIC EXAMINATION UNDER ANESTHESIA (OTHER THAN LOCAL);  Surgeon: Tinnie Cash, MD;  Location: [facility] OR [facility];  Service: Advanced Laparoscopy    PR REMOVAL IMPLANT DEEP Right 08/30/2018    Procedure: R20--REMOVE IMPLANT; DEEP--RIGHT ANKLE x 2 - modifier 22;  Surgeon: Fonda Rosalynn Cooler, MD;  Location: [facility];  Service: Orthopedics    PR REPAIR 1 COLLAT ANKLE LIGMNT,PRIMARY Right 11/10/2017    Procedure: REPAIR, PRIMARY, DISRUPTED LIGAMENT, ANKLE; COLLATERAL;  Surgeon: Fonda Rosalynn Cooler, MD;  Location: [facility] OR [facility];  Service: Ortho Foot & Ankle    PR UPPER GI ENDOSCOPY,BIOPSY N/A 12/20/2019    Procedure: UGI ENDOSCOPY; WITH BIOPSY, SINGLE OR MULTIPLE;  Surgeon: Burnard Almarie Budd, MD;  [facility]: [facility];  Service: Gastroenterology    SI joint surgery  2004    Bilateral     SPINAL FUSION      TONSILLECTOMY      TUBAL LIGATION

## 2023-12-20 NOTE — Unmapped (Signed)
 Thank you for choosing [facility]!  We appreciate the opportunity to participate in your care.      If any questions or concerns arise after your visit, please do not hesitant to contact me by [facility] or by calling our clinical team at [phone].    Please let me know if I can be of assistance with this or other orthopaedic issues in the future.

## 2023-12-21 DIAGNOSIS — E139 Other specified diabetes mellitus without complications: Principal | ICD-10-CM

## 2023-12-21 DIAGNOSIS — E039 Hypothyroidism, unspecified: Principal | ICD-10-CM

## 2023-12-21 DIAGNOSIS — H01009 Unspecified blepharitis unspecified eye, unspecified eyelid: Principal | ICD-10-CM

## 2023-12-21 DIAGNOSIS — H9209 Otalgia, unspecified ear: Principal | ICD-10-CM

## 2023-12-21 DIAGNOSIS — G894 Chronic pain syndrome: Principal | ICD-10-CM

## 2023-12-21 DIAGNOSIS — J4520 Mild intermittent asthma, uncomplicated: Principal | ICD-10-CM

## 2023-12-21 DIAGNOSIS — N951 Menopausal and female climacteric states: Principal | ICD-10-CM

## 2023-12-21 DIAGNOSIS — E7800 Pure hypercholesterolemia, unspecified: Principal | ICD-10-CM

## 2023-12-21 MED ORDER — CONJUGATED ESTROGENS 0.3 MG TABLET
ORAL_TABLET | Freq: Every day | ORAL | 1 refills | 90.00000 days | Status: CP
Start: 2023-12-21 — End: 2024-12-20

## 2023-12-21 MED ORDER — LEVOTHYROXINE 50 MCG TABLET
ORAL_TABLET | Freq: Every day | ORAL | 3 refills | 90.00000 days | Status: CP
Start: 2023-12-21 — End: 2024-12-20
  Filled 2024-01-11: qty 90, 90d supply, fill #0

## 2023-12-21 MED ORDER — FLUTICASONE PROPIONATE 50 MCG/ACTUATION NASAL SPRAY,SUSPENSION
Freq: Every day | NASAL | 3 refills | 120.00000 days | Status: CP
Start: 2023-12-21 — End: ?
  Filled 2024-01-03: qty 16, 60d supply, fill #0

## 2023-12-21 MED ORDER — PRAVASTATIN 40 MG TABLET
ORAL_TABLET | Freq: Every evening | ORAL | 3 refills | 90.00000 days | Status: CP
Start: 2023-12-21 — End: 2024-06-18
  Filled 2024-02-23: qty 90, 90d supply, fill #0

## 2023-12-21 MED ORDER — DEXAMETHASONE SODIUM PHOSPHATE 0.1 % EYE DROPS
Freq: Two times a day (BID) | OTIC | 3 refills | 17.00000 days | Status: CP | PRN
Start: 2023-12-21 — End: ?

## 2023-12-21 NOTE — Unmapped (Signed)
 Assessment and Plan:     Darlene Lucas was seen today for eye problem and pain.    Diagnoses and all orders for this visit:    Diabetes 1.5, managed as type 2    (CMS-HCC)  Elevated cholesterol          -     Patient maintaining Mediterranean diet, activity as able.        -     Remains adherent with 1000 mg metformin , 40 mg pravastatin , 7.5 mg weekly tirzepatide --managed by endocrinology        -     TC 202, LDL 114, A1c 5.1, Cr 0.87, K 4.5  -     pravastatin  (PRAVACHOL ) 40 MG tablet; Take 1 tablet (40 mg total) by mouth nightly.    Hot flashes due to menopause           -    Feels helpful for symptoms, refilled today  -     estrogens , conjugated, (PREMARIN ) 0.3 MG tablet; Take 1 tablet (0.3 mg total) by mouth daily.    Mild intermittent asthma without status asthmaticus without complication (HHS-HCC)          -     Pt adherent with Flonase , Singulair , as needed SABA inhaler.   -     fluticasone  propionate (FLONASE ) 50 mcg/actuation nasal spray; Use 1 spray in each nostril once daily.    Acquired hypothyroidism          -     TSH: 1.579, refilled levothyroxine   -     levothyroxine  (SYNTHROID ) 50 MCG tablet; Take 1 tablet (50 mcg total) by mouth daily.    Chronic pain syndrome          -     In past, patient on high quantities of opioids, controlled substances for chronic pain--pt discontinued.  Has tried Lyrica, gabapentin , duloxetine--felt side effects outweighed benefit--discontinued.  Followed by Ortho, got steroid injection in right ankle, has follow-up visit 03/28/23         -    Adherent with 200 mg BID celebrex , does not feel helpful for pain control.         -    Given chronicity of symptoms, patient referred to integrative medicine clinic--consider acupuncture.  Patient aware may not be covered by insurance plan.        -    Gentle activity, stretches.  -     Integrative Medicine; Future    Blepharitis, unspecified laterality, unspecified type           - Home care advice/reassurance--has abx drops to use as needed    Other orders  -     INFLUENZA VACCINE IIV3(IM)(PF)6 MOS UP        Barriers to recommended plan: None identified    Return for 4-6 months --folow up .      Subjective:     HPI: Darlene Lucas is a 53 y.o. female here for Eye Problem (Seeing ENT today) and Pain.    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    She has been experiencing blepharitis for the past year and a half, initially misdiagnosed as conjunctivitis. The condition is recurrent, with periods of remission lasting up to three months. Her primary concern is significant crusting. She uses tea tree oil wipes, suspecting mites  as a contributing factor, and is interested in identifying the cause during flare-ups.    She experiences chronic pain primarily due to arthritis, described as 'bone on bone pain.' She has been in chronic pain management for over twenty years due to back issues. She reports being at the limits of her Celebrex  dose for arthritis, but still experiences pain. She has tried duloxetine, Lyrica, and gabapentin  in the past but cannot tolerate them due to adverse effects. She is interested in exploring alternative therapies such as acupuncture.    She has a history of rosacea and menopause, which she feels have exacerbated her symptoms, particularly her pain levels. She is concerned about the impact of menopause on her pain.    Regarding cholesterol management, she is on 40 mg of rosuvastatin but is concerned about potential side effects. She follows a Mediterranean diet and has made lifestyle changes such as switching from butter to olive oil. She is considering adjusting her medication if her cholesterol levels do not improve.  TC 202, LDL 114, A1c 5.1, Cr 0.87, K 4.5    She is also managing a thyroid condition--50 mcg levothyroxine  TSH 1.579 and uses Flonase  and estrogen as part of her treatment regimen.        Pt agreeable for flu shot  I have reviewed past medical, surgical, medications, allergies, social and family histories today and updated them in Epic where appropriate.    ROS:   Review of Systems   Constitutional: Negative.    Respiratory: Negative.     Cardiovascular: Negative.    Musculoskeletal:  Positive for arthralgias, back pain and myalgias.   All other systems reviewed and are negative.     PHQ-9 PHQ-9 Total Score   07/08/2023  10:30 AM 2      GAD7 Total Score GAD-7 Total Score   07/08/2023  10:30 AM 0      Wt Readings from Last 6 Encounters:   12/21/23 62.1 kg (137 lb)   10/13/23 62.5 kg (137 lb 11.2 oz)   10/07/23 60.8 kg (134 lb)   08/05/23 (P) 60.8 kg (134 lb)   07/08/23 60.8 kg (134 lb)   04/27/23 64.1 kg (141 lb 6.4 oz)      Review of systems negative unless otherwise noted as per HPI.      Objective:     Vitals:    12/21/23 0909   BP: 112/70   Pulse: 87   SpO2: 98%     Body mass index is 25.89 kg/m??.    Physical Exam  Vitals and nursing note reviewed.   Constitutional:       Appearance: Normal appearance.   HENT:      Head: Normocephalic and atraumatic.   Cardiovascular:      Rate and Rhythm: Normal rate and regular rhythm.      Pulses: Normal pulses.      Heart sounds: Normal heart sounds.   Pulmonary:      Effort: Pulmonary effort is normal. No respiratory distress.      Breath sounds: Normal breath sounds. No stridor. No wheezing, rhonchi or rales.   Chest:      Chest wall: No tenderness.   Musculoskeletal:         General: Normal range of motion.      Cervical back: Normal range of motion and neck supple.   Skin:     General: Skin is warm.      Capillary Refill: Capillary refill takes less than 2 seconds.  Neurological:      General: No focal deficit present.      Mental Status: She is alert and oriented to person, place, and time. Mental status is at baseline.   Psychiatric:         Mood and Affect: Mood normal.         Behavior: Behavior normal. Thought Content: Thought content normal.         Judgment: Judgment normal.            Medication adherence and barriers to the treatment plan have been addressed. Opportunities to optimize healthy behaviors have been discussed. Patient / caregiver voiced understanding.   I personally spent 30 minutes face-to-face and non-face-to-face in the care of this patient, which includes all pre, intra, and post visit time on the date of service.  Alfonso Grow, DNP, FNP-C  [facility]  [phone] (F)    Note - This record has been created using [employer]. Chart creation errors have been sought, but may not always have been located. Such creation errors do not reflect on the standard of medical care.

## 2023-12-21 NOTE — Unmapped (Signed)
 Dr. Laquita Patient Handout on Eustachian Tube Dysfunction    What is the Eustachian Tube?  The Eustachian tube is an airway passage which connects the middle ear to the back of the nose/throat. The opening and closing of the Eustachian tube is usually controlled by a number of small muscles at the back of the throat.  The main role of the Eustachian tube is to balance the air pressure in the middle ear with the air pressure  in the outside environment. It is also important for drainage of secretions from the middle ear. Normally, the Eustachian tube is closed and opens when we yawn, chew or swallow.          What is Eustachian Tube Dysfunction?  Sometimes, the Eustachian tube may become blocked and this is known as Eustachian tube dysfunction (ETD). This results in insufficient air getting to the middle part of the ear, causing the air pressure to become reduced or negative. This can cause a feeling of fullness in the ear and may lead to sounds being muffled.    What are symptoms of Eustachian Tube Dysfunction?  Symptoms associated with Eustachian tube dysfunction include:  a feeling of fullness or 'blocked' ears  crackling or clicking sounds when chewing or swallowing  intermittent pain and muffled sounds     What are the causes of Eustachian Tube Dysfunction?   Eustachian tube dysfunction is often caused by differences in air pressure between inside the body and the environment, arising from:  altitude changes (air travel, driving through hilly environments, scuba diving)  nasal congestion, sinus problems allergies or a common cold  chest, ear or sinus infections  failure of the Eustachian tube to open when yawning/swallowing  Smoking which causes swelling of the back of the nose   In around 1 in 5 people who have long-term ETD, no cause is found. There is no evidence that there is a genetic cause, and it doesn't appear to run in families.    How is Eustachian Tube Dysfunction Diagnosed?   There is no diagnostic test for ETD. The diagnosis is usually made based on history and examination of the ears.   A hearing test is required to make sure nothing else is going on with the ear.    What is the treatment for Eustachian Tube Dysfunction?  In mild cases of ETD often no treatment is required, and the symptoms usually resolve within a few weeks.    Medical Treatment for ETD includes a nasal steroid spray (Flonase  is usually first line) and nasal saline irrigations.     Nasal Steroid Spray Instructions - 2 sprays twice a day in each nostril.      Step 1. Prepare the nose. Blow the nose before administering the drug.    Step 2. Prime and activate the delivery device as recommended by the manufacturer.    Step 3. Position the head by tilting the head forward.    Step 4. Insert the tip of the applicator gently, avoiding contact with the septum.    Step 5. Aim the applicator tip about 45?? from the floor of the nose and direct it at the outer corner of the eye on the same side to avoid traumatizing or spraying the septum.    Step 6. Close the other nostril gently with a finger.     Step 7. Sniff or inhale gently while delivering the drug.    Nasal Saline Irrigation Instructions - Twice a day.  The Benefits:    1. When you irrigate, the isotonic saline (salt water) acts as a solvent and washes the mucus crusts and other debris from your nose.    2. This decongests and improves the airflow into your nose.  The sinus passages begin to open.    3. Studies have also shown that a salt water and an alkaline (baking soda) irrigation solution improves nasal membrane cell function (mucociliary flow of mucus debris).    The Recipe:    1. Choose a 1-quart glass jar that is thoroughly cleansed.    2. Fill with sterile or distilled water, or you can boil water from the tap.    3. Add 1 to 2 heaping teaspoons of pickling/canning/sea salt (NOT table salt as it contains a large number of additives).  This salt is available at the grocery store in the food canning section.    4. Add 1 teaspoon of [employer] Hammer Baking Soda (pure bicarbonate).    5. Mix ingredients together and store at room temperature.  Discard after one week.  If you find this solution too strong, you may decrease the amount of salt added to 1 to 1 ?? teaspoons.  With children it is often best to start with a milder solution and advance slowly.  Irrigate with 240 ml (8 oz) twice daily.    The Instructions:    You should plan to irrigate your nose with buffered isotonic saline 2 times per day.  Many people prefer to warm the solution slightly in the microwave - but be sure that the solution is NOT HOT.  Stand over the sink (some do this in the shower) and squirt the solution into each side of your nose, keeping your mouth open. This allows you to spit the saltwater out of your mouth.  It will not harm you if you swallow a little.    If you have been told to use a nasal steroid such as Flonase , Nasonex, or Nasacort , you should always use isotonic saline solution first, then use your nasal steroid product.  The nasal steroid is much more effective when sprayed onto clean nasal membranes and the steroid medicine will reach deeper into the nose.    Most people experience a little burning sensation the first few times they use an isotonic saline solution, but this usually goes away within a few days.    Surgical Treatment for ETD - Sometimes the above treatments do not resolve the issue and additional intervention is required. For some patients, a procedure called eustachian tube dilation allows for normalization of the eustachian tube function over time. This procedure involves general anesthesia and a dilation of the eustachian tube with a balloon through the nose. Sometimes, ear tubes are placed at the same time.    Who can I contact with questions or concerns?   You saw Joesph Luster, MD for your visit today    Please contact Dr. Laquita nurse, Lorene Klimas, with any medical questions you may have via MyChart.     For surgical scheduling, please call [phone]. Please contact with surgical scheduling questions or if you have not received a call to schedule surgery within a week of your last your appointment.    For clinic scheduling by phone, please call [phone] and listen to the options.  For medical questions by phone via triage nurse, please call [phone].   Please call [facility] Department of Radiology at [phone], Option 1 for CT, MRI, and ultrasounds. Press option  2 for PET scans. Dr. Joel will contact you with results.   If an outside facility needs to fax confidential documents, please have them fax to [phone].   If you are in need of an appointment with audiology prior to your next appointment with Dr. Joel, please contact [phone] for adults or [phone] for pediatrics.   For anything pertaining to medical records at [facility] please call [phone].   If you do not have insurance, or are concerned about paying for your medical bill, please call our Financial Assistance Unit Monday through Thursday 8 a.m. to 4:30 p.m. and Friday, 8 a.m. to 1 p.m. at [phone] or toll-free at [phone]. We may be able to help you. All of our services are confidential.    Clinic hours are Monday through Friday 8:00 AM to 4:30 PM.    To contact the after hours ENT physican, call [phone] and request to have ENT on call provider paged.    Thank you for choosing [facility]!

## 2023-12-28 ENCOUNTER — Ambulatory Visit: Admit: 2023-12-28 | Discharge: 2023-12-29 | Payer: Medicaid (Managed Care) | Attending: Family | Primary: Family

## 2023-12-28 DIAGNOSIS — M170 Bilateral primary osteoarthritis of knee: Principal | ICD-10-CM

## 2023-12-28 MED ADMIN — triamcinolone acetonide (KENALOG) injection 20 mg: 20 mg | INTRA_ARTICULAR | @ 12:00:00 | Stop: 2023-12-28

## 2023-12-28 NOTE — Unmapped (Signed)
 SPORTS MEDICINE RETURN VISIT    ASSESSMENT AND PLAN      Diagnosis ICD-10-CM Associated Orders   1. Primary osteoarthritis of both knees  M17.0                Unfortunately, her insurance does not cover viscosupplementation injection and no operative intervention is recommended.  She will proceed with intra-articular steroid injection today.    No follow-ups on file.    Procedure(s):  B knee Intra-articular steroid injections      SUBJECTIVE     Chief Complaint:   No chief complaint on file.      History of Present Illness: 53 y.o. female who presents for bilateral knee pain, requesting intra-articular steroid injections.  Last Steroid injections in knees were 09/16/23.   No interval injury.    Past Medical History:   Past Medical History:   Diagnosis Date    Abnormal Pap smear of cervix     Abnormal uterine bleeding     Was told possible adenomyosis, possible need for hysterectomy by Dr. Rojelio. Had US , and trial of 90 days of provera. 11/29/13 TVUS 5x6x4cm RO 2x1.5x1.5 LO 2x2x 1.6, EMS 5mm, abundant flow near endometrium c/w possible adenomyosis     Allergic Age 89    Amblyopia 1974    Anemia     Ankylosing spondylitis of site in spine    (CMS-HCC) 2005    Arthritis     Asthma (HHS-HCC)     Blepharitis November 2021    BMI 50.0-59.9, adult (CMS-HCC)     Bursitis 2007    Multiple areas    Chronic back pain     Chronic kidney disease     Constipation     CTS (carpal tunnel syndrome)     Deep vein thrombosis    (CMS-HCC)     Diabetes mellitus (CMS-HCC) 2020    Difficult intravenous access     Dry eyes     Dysplasia of cervix, high grade CIN 2     Edema     Elevated LFTs     Environmental allergies     Eye trauma 2020    fractured orbit. Unsure which eye.    Gait abnormality 2003    Uses of a cane as needed    GERD (gastroesophageal reflux disease)     Goiter 2015    monitoring levels    Gout 2019    Headache     Heart murmur 2018    Hyperlipidemia     Hypertension 2021    Hypokalemia     Kidney stone started 1986 [id number]    Osteoarthritis 2003    Osteoporosis     Prediabetes     Pulmonary embolism (CMS-HCC) 1989, 1992    following miscarriages     Recurrent UTI     Right leg pain 10/2017    right fibula fracture    Scoliosis 1974    Spondylolisthesis     Thyroid function study abnormality 2015    seeing endocrine (TSH 5.37 12/25/13, T4 1.02         OBJECTIVE     Physical Exam:  Vitals:   Wt Readings from Last 3 Encounters:   12/21/23 62.1 kg (137 lb)   10/13/23 62.5 kg (137 lb 11.2 oz)   10/07/23 60.8 kg (134 lb)     Estimated body mass index is 25.89 kg/m?? as calculated from the following:    Height as of 12/21/23: 154.9 cm (5' 1).  Weight as of 12/21/23: 62.1 kg (137 lb).  Gen: Well-appearing female in no acute distress  MSK: Gait is slow but steady.  No obvious effusion bilateral knees.   No pain throughout arc of range of motion.  Skin is warm, dry and intact without excessive warmth, erythema or ecchymosis.    Imaging/other tests: No new x-rays taken today.  X-rays dated 06/04/2022 show:  1. Unchanged early patellofemoral predominant tricompartmental osteoarthrosis.   2. Small right knee effusion.   3. Right knee chondrocalcinosis.       ADMINISTRATIVE     I have personally reviewed and interpreted the images (as available).  Point-of-care ultrasound imaging is on file and stored in a permanent location (if performed).  I have personally reviewed prior records and incorporated relevant information above (as available).    @SMIBILLING @    PROCEDURES     Lg Joint Inj: bilateral knee on 12/28/2023 8:20 AM  Indications: pain  Details: 22 G needle, anterolateral approach  Laterality: bilateral  Location: knee  Medications (Right): 20 mg triamcinolone  acetonide 10 mg/mL  Medications (Left): 20 mg triamcinolone  acetonide 10 mg/mL  Outcome: tolerated well, no immediate complications  Procedure, treatment alternatives, risks and benefits explained, specific risks discussed. Consent was given by the patient. Immediately prior to procedure a time out was called to verify the correct patient, procedure, equipment, support staff and site/side marked as required. Patient was prepped and draped in the usual sterile fashion.     Medical Care Team Attestation: All ProcDoc orders were read back and verbally confirmed with the procedure provider, including but not limited to patient name, medication name, dose, and route, before any actions were taken.  Provider Attestation: The information documented by members of my medical care team was reviewed and verified for accuracy by me.           DME     DME ORDER:  Dx:  ,

## 2023-12-29 NOTE — Unmapped (Signed)
 LVM to offer earlier appointment with Dr. Nancyann on 12/30/23.

## 2024-01-02 DIAGNOSIS — L719 Rosacea, unspecified: Principal | ICD-10-CM

## 2024-01-02 DIAGNOSIS — L720 Epidermal cyst: Principal | ICD-10-CM

## 2024-01-03 MED FILL — MOUNJARO 7.5 MG/0.5 ML SUBCUTANEOUS PEN INJECTOR: SUBCUTANEOUS | 28 days supply | Qty: 2 | Fill #5

## 2024-01-03 MED FILL — MONTELUKAST 10 MG TABLET: ORAL | 90 days supply | Qty: 90 | Fill #1

## 2024-01-09 DIAGNOSIS — N951 Menopausal and female climacteric states: Principal | ICD-10-CM

## 2024-01-09 MED ORDER — PREMARIN 0.625 MG/GRAM VAGINAL CREAM
Freq: Every day | VAGINAL | 11 refills | 60.00000 days | Status: CP
Start: 2024-01-09 — End: 2025-01-08
  Filled 2024-01-11: qty 30, 60d supply, fill #0

## 2024-01-09 NOTE — Unmapped (Signed)
 Patient is requesting the following refill  Requested Prescriptions     Pending Prescriptions Disp Refills    conjugated estrogens  (PREMARIN ) 0.625 mg/gram vaginal cream 30 g 11     Sig: Insert 0.5 g into the vagina daily.       Recent Visits  Date Type Provider Dept   12/21/23 Office Visit Lora Alfonso HERO, FNP [facility] [address] [facility]   10/13/23 Office Visit Lora, Alfonso HERO, FNP [facility] [address] [facility]   07/08/23 Office Visit Camelia, Sherwood Sieving, NP [facility] [address] [facility]   04/12/23 Telemedicine Farrug, Sherwood Sieving, NP [facility] [address] At [facility]   04/07/23 Telemedicine Tobie Buster [place], FNP [facility]   03/04/23 Telemedicine Geralene Levorn Geralds, FNP [facility] [address] [facility]   02/25/23 Office Visit Camelia, Sherwood Sieving, NP [place] [facility] [address] [facility]   02/09/23 Telemedicine Farrug, Sherwood Sieving, NP [facility] [address] [facility]   Showing recent visits within past 365 days and meeting all other requirements  Future Appointments  Date Type Provider Dept   06/20/24 Appointment Lora Alfonso HERO, FNP [facility] [address] At [facility]   Showing future appointments within next 365 days and meeting all other requirements       Labs: Not applicable this refill

## 2024-01-10 DIAGNOSIS — N951 Menopausal and female climacteric states: Principal | ICD-10-CM

## 2024-01-11 MED FILL — CELECOXIB 200 MG CAPSULE: ORAL | 90 days supply | Qty: 180 | Fill #2

## 2024-01-11 MED FILL — METFORMIN ER 500 MG TABLET,EXTENDED RELEASE 24 HR: ORAL | 90 days supply | Qty: 180 | Fill #2

## 2024-01-12 NOTE — Progress Notes (Signed)
 [facility]   Physical Medicine and Rehabilitation     Patient Name:Darlene Lucas  MRN: [id number]  DOB: 1970/04/23  Age: 53 y.o.     ----------------------------------------------------------------------------------------------------------------------  January 12, 2024 9:42 AM. Documentation assistance provided by [name], medical scribe, at the direction of [name], MD.  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     --Right cubital tunnel syndrome and right carpal tunnel syndrome, improving     -- right cervical radicular symptoms     -- thoracic axial pain        TREATMENT PLAN:   Performed bilateral SI joint steroid injections today. See full procedure note below.     Discussed with patient today that steroid injections can cause increases in blood sugar levels especially in relation to the timing of her knee joint injections     Provided an external referral to physical therapy today. Recommend trying dry needling to help with pain relief. This can be taken to a location convenient for you,     NEXT STEPS/FOLLOW UP: 4 months for bilateral SI joint/ligament steroid injection    PROCEDURE: Bilateral sacroiliac (SI) joint and ligament injection with ultrasound guidance  INDICATION: sacroiliac joint dysfunction and buttock pain  TECHNIQUE: After confirming written and informed consent discussing risks of bleeding, infection, nerve injury, medication reaction, and alternative of not doing the procedure, the patient was placed in the prone position. Pre-procedure vitals were reviewed. A TIME OUT was performed. The patient did not require sedation for the procedure. Using universal sterile precautions, procedure equipment and medications were prepared in sterile fashion and skin was prepped with chlorhexidine. In addition a sterile ultrasound probe cover was utilized as well as sterile ultrasound gel. Ultrasound visualization was utilized to delineate the anatomy of the sacrum and thereafter used to guide needle placement. The Bilateral SI joint was identified.  Skin was anesthetized using 2ml of 1% lidocaine  without epinephrine. A 20 gauge 3.5 inch needle was directed toward the target using sonographic guidance. Under Ultrasound guidance, the SI joint was then injected with 10mg  dexamethasone  and 1 cc of 1% lidocaine  for a total injectate volume of 2 cc. Negative aspiration was noted prior to injection. The needle tip was visualized at a depth of 3cm.  No paresthesias were reported with needle placement.  The needle(s) were removed intact and a bandage was applied. The patient was monitored, reassessed and discharged after an appropriate observatory period.  COMPLICATIONS: The patient tolerated the procedure well and there were no complications.  PRE-PROCEDURE PAIN SCORE: 7 bilateral  POST-PROCEDURE PAIN SCORE: 5 on left, 4 on right  POST-PROCEDURE EXAM: Marked improvement in pain.  No new neurologic compromise; patient discharged with normal baseline neurologic function.    Assistant: [name], CMA  Medical Chaperone: [name], Scribe    The imaging is on file and stored in a permanent location.         SUBJECTIVE:     Chief Complaint:   Back pain, RUE radic, repeat SI injections  01/13/2024:  Pt presents in follow up after deferring SI joint injection, ordering PT, continuing elbow braces and nerve flossing exercises, continuing Tylenol   PRN, and seeing [facility]. Today pt reports her previous injection was helpful. She does note returning pain and would like repeat injections today. She continues to have bilateral SI joint pain (R>L). She states her pain relief lasted for 4 months. She rates her pain at a 7/10 today. Pt notes she has not received needling treatments in PT, but found these helpful previously and would like an updated referral specifying this. She endorses bilateral knee steroid injections on 10/8.    10/07/2023  Pt presents in follow up after receiving bilateral SI joint/ligament steroid injection and a referral for PT. Today pt reports the injection continues to provide 70% improvement, however wearing off at this time. Today, 7/10. She notes her SI pain is worse when her knees are hurting. The nighttime arm braces have been helpful, uses celebrex  as well. She has noticed some weakness and tingling in her hands and feet, but these symptoms are not consistent.  She has not been able to get in with physical therapy yet but has been doing home exercises. Would like an external PT referral.    Renal labs stable  AST/ALT stable    08/05/2023  Pt presents in follow up after obtaining an EMG, full spine x-rays, and a cervical spine MRI and referring to PT. Today pt reports she is not currently doing PT. She also endorses continued neck and low back pain. She notes that her entire left arm will start tingling especially when she's driving. She endorses weakness in both hands and carpal tunnel syndrome in her right arm. She feels like her neck is tight and gets tired easily. She states she is in the [facility] acupuncture study currently.    EMG (07/05/2023):  Conclusions:  This is a normal study.  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 Sonographic findings support.  --Right chronic lower cervical radiculopathy without active denervation.    MRI Cervical Spine (07/18/2023):  Impression  Multilevel cervical spondylosis and degenerative disc disease, most prominent at C6-C7 where there is severe spinal canal narrowing, mild cord compression and severe bilateral neuroforaminal narrowing.  Moderate spinal canal narrowing, cord indentation and severe left neural foraminal narrowing at C5-C6.    X-Ray Lumbar Spine (04/25/2023):  Impression  Post L2-S1 posterior decompression and instrumented fusion without imaged hardware complication. Similar retrolisthesis of L1 on L2.    X-Ray Thoracic Spine (04/25/2023):  Impression  Mild spondylosis of the midthoracic spine. No evidence for acute fracture or malalignment.    X-Ray Cervical Spine (04/25/2023):  Impression  Degenerative disc disease, particularly at C5-6 and C6-7.    04/06/2023  Pt presents in f/u after receiving b/l SIJ injection at last visit, referring to PT, and ordering lumbar x-ray. Today pt reports the SI joint injections provide enough relief to keep her pain manageable. She is planning to get her lumbar x-ray done. She feels like she may be having some bursitis in her left hip region. She is having a hard time standing back up from bending over due to her back and has new shooting pains down R arm to the fingers (digits 3 and 4 primarily) similar to radicular pain in her RLE. She endorses a severe itching sensation below her bra strap, which is usually associated w/ disc issues for her. She would like to proceed with repeat b/l SI joint injections today.     11/05/2022  Pt presents in f/u after receiving repeat bilateral SI joint injections at last visit. Today  pt reports an itching sensation in the midback above her fusion, which she has associated with the onset of disc pain in the past. Also endorses clicking and popping in spine. She reports some pain shooting down arm but it is not specific. She also has noticed worsened posture with her gait recently. She is concerned this is due to worsening back pain.     07/16/22:  Pt presents in follow up after receiving repeat bilateral SI joint injections at last visit. Today, reports that the prior injection provided 70% relief. Pain has returned over the past week.    02/17/22:  Pt presents in f/u for repeat SIJ injections after receiving bilateral SIJ injections at last visit and ordering L5-S1 facet injections. Today pt reports previous bilateral SIJ injections provided 100% relief for 3-4 months. They help her to walk which in turn helps her to lose weight. She is starting PT today. Pt has lost 127 pounds since the first time she has started seeking help for her back pain.    10/16/21:  Pt presents in f/u for bilateral SI joint injections after ordering L5-S1 facet injections . Today pt reports recent knee surgery about a month ago. No current ABX.   Wants bilateral SI inj.    07/08/21:   Pt presents in f/u after receiving bilateral facet injections at L5/S1 on 01/26/21. Today pt reports facet and SI injections were both helpful. Pt reports that the SI joint injections were more beneficial for pain than the facet injections. She finds injections are more effective when done around the same time.     01/21/21:  Pt presents in f/u for bilateral SI joint injections. Bilateral L5/S1 facet injections scheduled for 01/26/21. Today pt reports she'd like bilateral SI joint injections. She hasn't had SI injections in a year. She usually gets good relief from a schedule of SI joint injections and facet injections. States right side pain is radiating around the right side. She feels she needs injections every 6-8 mos to stay ahead of the pain.     12/18/20  Pt presents in f/u (previous Dr. Loria patient) after receiving bilateral L5/S1 facet injections and bilateral SI joint injection at last visit (01/28/20).     Chronic midline low back pain currently controlled though currently seeking repeat bilateral SI joint and facet injections.  Patient endorses not being interested in SI joint radiofrequency ablation if injections become sub  Therapeutic. Patient notes losing 50 lbs intentionally.     Symptom Location: Bilateral SI joint pain with radicular component halfway down posterior bilateral thights.   Symptom Character: burning and sharp  Symptom Onset/Mechanism: chronic (>/= 3 months), symptoms flared over last 4 months.   Temporal Pattern: constant  Night Pain: no  Unintended Weight Loss: no  Fever/Infection:no  Neuromotor Function: motor weakness - (yes) endorses chronic, permanant nerve weakness in R>L legs,  gait/coordination disturbance - (no), loss of bowel or bladder control - (no), saddle anesthesia - (no)     History of Present Illness:   Darlene Lucas is a 53 y.o. year old female being evaluated in follow up.   01/15/2020: 90% relief from bilateral L5-S1 facet joint injections and bilateral SI joint injections at last visit. Has been dealing with chronic right ankle pain from previous surgeries with Dr. Tisa and working on [facility] with [facility]. Would like to repeat bilateral facet and SI joint injections given significant improvement in symptoms for >6 months with 80% relief.    Prior Interventions/Modalities:  -  oxycodone , fentanyl    Meds:  - celebrex  200 mg BID      Prior Diagnostics:  XR Lumbar Spine 02/27/19  IMPRESSION:  -- Unchanged sequelae of posterior decompression and posterior spinal fixation extending from L2 to L5. No adverse radiographic hardware features.  -- Unchanged mild degenerative changes of the lumbar spine.    XR Pelvis 02/22/19  IMPRESSION:  1. Posterior decompression and posterior fusion L2-L5 with interbody spacer at L4-L5. No evidence of hardware complication.  2. Unchanged 2 mm retrolisthesis of L1 on L2, likely degenerative.  3. Mild degenerative disc disease at T12-L1 and L1-L2 and moderate degenerative disc disease at L5-S1, similar to the prior 2016 CT.  4. Bilateral sacroiliac fixation with periarticular sclerosis, likely reactive/postoperative.    Current Outpatient Medications   Medication Sig Dispense Refill    aspirin  81 MG chewable tablet Chew 1 tablet (81 mg total) daily. 30 tablet 0    blood sugar diagnostic (ACCU-CHEK GUIDE TEST STRIPS) Strp Use to check blood sugar once daily as directed 100 strip 3    calcium  citrate-vitamin D3 (CITRACAL + D MAXIMUM) 315 mg-6.25 mcg (250 unit) per tablet Take 1 tablet by mouth Two (2) times a day. 60 tablet 1    celecoxib  (CELEBREX ) 200 MG capsule Take 1 capsule (200 mg total) by mouth two (2) times a day as needed for pain. 180 capsule 3    chlorhexidine (PERIDEX) 0.12 % solution RINSE WITH CAPFUL ONCE DAILY THEN SPIT. DO NOT RINSE, EAT, OR DRINK 30 MINUTES AFTER USE      conjugated estrogens  (PREMARIN ) 0.625 mg/gram vaginal cream Insert 0.5 g into the vagina daily. 30 g 11    dexAMETHasone  (DECADRON ) 0.1 % ophthalmic solution Administer 3 drops into both ears two (2) times a day as needed (otalgai). 5 mL 3    diclofenac  sodium 1.5 % Drop Apply 40 drops topically four (4) times a day as needed for pain 150 mL 2    estrogens , conjugated, (PREMARIN ) 0.3 MG tablet Take 1 tablet (0.3 mg total) by mouth daily. 90 tablet 1    finasteride  (PROPECIA ) 1 mg tablet Take 1 tablet (1 mg total) by mouth daily. 90 tablet 3    fluticasone  propionate (FLONASE ) 50 mcg/actuation nasal spray Use 1 spray in each nostril once daily. 16 g 3    hydrocortisone  2.5 % ointment Apply to affected area on ears twice daily until improved or for up to 2 weeks, whichever is sooner. Break for 1-2 weeks. Restart as needed. 30 g 1    ketoconazole  (NIZORAL ) 2 % cream Apply 1 Application topically two (2) times a day. 30 g 11    lancets (ACCU-CHEK SOFTCLIX LANCETS) Misc Use to check blood sugar once daily as directed 100 each 3    levothyroxine  (SYNTHROID ) 50 MCG tablet Take 1 tablet (50 mcg total) by mouth daily. 90 tablet 3    metFORMIN  (GLUCOPHAGE -XR) 500 MG 24 hr tablet Take 2 tablets (1,000 mg total) by mouth daily with evening meal. 120 tablet 5    minoxidil  (LONITEN ) 2.5 MG tablet Take 1/2 tablet (1.25 mg total) by mouth daily. 45 tablet 3    montelukast  (SINGULAIR ) 10 mg tablet Take 1 tablet (10 mg total) by mouth daily. 90 tablet 3    polymyxin B  sulf-trimethoprim  (POLYTRIM ) 10,000 unit- 1 mg/mL Drop Administer 1 drop to both eyes every six (6) hours. 10 mL 0    pravastatin  (PRAVACHOL ) 40 MG tablet Take 1 tablet (40 mg total) by mouth  nightly. 90 tablet 3    spironolactone  (ALDACTONE ) 100 MG tablet Take 1 tablet (100 mg total) by mouth two (2) times a day. 180 tablet 3    tirzepatide  (MOUNJARO ) 7.5 mg/0.5 mL PnIj Inject 7.5 mg subcutaneously every 7 days 2 mL 5    traMADol  (ULTRAM ) 50 mg tablet Take 1 tablet (50 mg total) by mouth every six (6) hours as needed for pain. 40 tablet 0    tretinoin  (RETIN-A ) 0.05 % cream Apply a pea sized amount at night 2-3 times a week x 2 weeks, then every other night for 2 weeks, then nightly. If getting dry, use a facial moisturizer in the morning such as Oil of Olay complete sensitive skin or CereVe AM 45 g 4    VENTOLIN  HFA 90 mcg/actuation inhaler Inhale 2 puffs every six (6) hours as needed for wheezing. 54 g 1     Current Facility-Administered Medications   Medication Dose Route Frequency Provider Last Rate Last Admin    dexAMETHasone  sodium phos (PF) injection 10 mg  10 mg Intra-articular Once         lidocaine  (PF) (XYLOCAINE -MPF) 10 mg/mL (1 %) injection 5 mL  5 mL Other Once            Allergies:   Iohexol, Propoxyphene, Shellfish containing products, Sulfa (sulfonamide antibiotics), Topiramate, and Amoxicillin     Past Medical / Surgical History:     Past Medical History:   Diagnosis Date    Abnormal Pap smear of cervix     Abnormal uterine bleeding     Was told possible adenomyosis, possible need for hysterectomy by Dr. Rojelio. Had US , and trial of 90 days of provera. 11/29/13 TVUS 5x6x4cm RO 2x1.5x1.5 LO 2x2x 1.6, EMS 5mm, abundant flow near endometrium c/w possible adenomyosis     Allergic Age 3    Amblyopia 1974    Anemia     Ankylosing spondylitis of site in spine    (CMS-HCC) 2005    Arthritis     Asthma (HHS-HCC)     Blepharitis November 2021    BMI 50.0-59.9, adult (CMS-HCC) Bursitis 2007    Multiple areas    Chronic back pain     Chronic kidney disease     Constipation     CTS (carpal tunnel syndrome)     Deep vein thrombosis    (CMS-HCC)     Diabetes mellitus (CMS-HCC) 2020    Difficult intravenous access     Dry eyes     Dysplasia of cervix, high grade CIN 2     Edema     Elevated LFTs     Environmental allergies     Eye trauma 2020    fractured orbit. Unsure which eye.    Gait abnormality 2003    Uses of a cane as needed    GERD (gastroesophageal reflux disease)     Goiter 2015    monitoring levels    Gout 2019    Headache     Heart murmur 2018    Hyperlipidemia     Hypertension 2021    Hypokalemia     Kidney stone started 1986    [id number]    Osteoarthritis 2003    Osteoporosis     Prediabetes     Pulmonary embolism (CMS-HCC) 1989, 1992    following miscarriages     Recurrent UTI     Right leg pain 10/2017    right fibula fracture    Scoliosis 1974  Spondylolisthesis     Thyroid function study abnormality 2015    seeing endocrine (TSH 5.37 12/25/13, T4 1.02       Past Surgical History:   Procedure Laterality Date    ANKLE FRACTURE SURGERY  2019    BACK SURGERY      CARPAL TUNNEL RELEASE      ENDOMETRIAL BIOPSY      11/2013    FOOT SURGERY  2019    FRACTURE SURGERY  2007/2019    HYSTERECTOMY      Knee arthroscopy Left 1987    KNEE ARTHROSCOPY      LAMINECTOMY      LUMBAR FUSION      L5 - S1 - multiple - also had hardware removed    LUMBAR FUSION  2008    L1-S1    PR ANKLE SCOPE,EXTENS DEBRIDEMNT Right 08/30/2018    Procedure: ARTHROSCOPY ANK SURG; DEBRID EXTEN - modifier 22;  Surgeon: Fonda Rosalynn Cooler, MD;  Location: [facility] OR [facility];  Service: Orthopedics    PR ARTHROCENTESIS ASPIR&/INJ MAJOR JT/BURSA W/O US  Right 08/30/2018    Procedure: ARTHROCENTESIS, ASPIRATION AND/OR INJECTION; MAJOR JOINT OR BURSA (E.G.SHOULDER/HIP/KNEE);  Surgeon: Fonda Rosalynn Cooler, MD;  Location: [facility] OR [facility];  Service: Orthopedics    PR BIOPSY OF VAGINA,EXTENSIVE N/A 09/20/2014    Procedure: BIOPSY VAGINAL MUCOSA; EXTENSIVE, REQUIRING SUTURE (INCLUDING CYSTS);  Surgeon: Tinnie Cash, MD;  [facility];  Service: Advanced Laparoscopy    PR BIOPSY/EXCISION, LYMPH NODE(S) N/A 09/20/2014    Procedure: BX/EXC LYMPH NODE; OPEN, SUPERF (SEPART PROC);  Surgeon: Tinnie Cash, MD;  Location: [facility] OR [facility];  Service: Advanced Laparoscopy    PR CLOSED TREATMENT PST MALLEOLUS FRACTURE W/O MANIP Right 11/10/2017    Procedure: CLOSED TREATMENT OF POSTERIOR MALLEOLUS FRACTURE; WITHOUT MANIPULATION;  Surgeon: Fonda Rosalynn Cooler, MD;  Location: [facility] OR [facility];  Service: Ortho Foot & Ankle    PR COLPOSCOPY,ENTIRE VAGINA,W/BIOPSY(S) N/A 09/20/2014    Procedure: COLPOSCOPY OF THE ENTIRE VAGINA, WITH CERVIX IF PRESENT; WITH BIOPSY(S) OF VAGINA/CERVIX;  Surgeon: Tinnie Cash, MD;  [facility] OR [facility];  Service: Advanced Laparoscopy    PR COLSC FLX W/RMVL OF TUMOR POLYP LESION SNARE TQ N/A 12/20/2019    Procedure: COLONOSCOPY FLEX; W/REMOV TUMOR/LES BY SNARE;  Surgeon: Burnard Almarie Budd, MD;  [facility]: [facility];  Service: Gastroenterology    PR CYSTOURETHROSCOPY N/A 06/13/2014    Procedure: CYSTOURETHROSCOPY (SEPARATE PROCEDURE);  Surgeon: Tinnie Cash, MD;  [facility]: [facility] OR [facility];  Service: Advanced Laparoscopy    PR KNEE SCOPE,MED OR LAT MENIS REPAIR Right 09/15/2021    Procedure: ARTHROSCOPY, KNEE, SURGICAL; WITH MENISCUS REPAIR (MEDIAL OR LATERAL);  Surgeon: Lamar Marsa Hamming, MD;  [facility]: [facility];  Service: Orthopedics    PR LAP,VAG HYST,UTERUS 250GMS/<,SALP-OOPH N/A 06/13/2014    Procedure: ROBOTIC LAPAROSCOPY, SURGICAL, W/VAGINAL HYSTERECTOMY, UTERUS 250 GRAMS OR LESS; W/REMOVAL OF TUBE(S) &/OR OVARY(S);  Surgeon: Tinnie Cash, MD;  Location: [facility] OR [facility];  Service: Advanced Laparoscopy    PR OPEN TREATMENT PROXIMAL FIBULA/SHAFT FRACTURE Right 11/10/2017    Procedure: OPEN TREATMENT OF PROXIMAL FIBULA OR SHAFT FRACTURE, INCLUDES INTERNAL FIXATION, WHEN PERFORMED - Modifier 22;  Surgeon: Fonda Rosalynn Cooler, MD;  Location: [facility];  Service: Ortho Foot & Ankle    PR OPEN TX DISTAL TIBIOFIBULAR JOINT DISRUPTION Right 11/10/2017    Procedure: OPEN TREATMENT OF DISTAL TIBIOFIBULAR JOINT (SYNDESMOSIS) DISRUPTION, INCLUDES INTERNAL FIXATION, IF DONE;  Surgeon: Fonda Rosalynn Cooler, MD;  Location: [facility] OR [place];  Service: Ortho Foot & Ankle    PR PELVIC EXAMINATION W ANESTH N/A 09/20/2014    Procedure: PELVIC EXAMINATION UNDER ANESTHESIA (OTHER THAN LOCAL);  Surgeon: Tinnie Cash, MD;  [facility] OR [facility];  Service: Advanced Laparoscopy    PR REMOVAL IMPLANT DEEP Right 08/30/2018    Procedure: R20--REMOVE IMPLANT; DEEP--RIGHT ANKLE x 2 - modifier 22;  Surgeon: Fonda Rosalynn Cooler, MD;  Location: [facility] OR [facility];  Service: Orthopedics    PR REPAIR 1 COLLAT ANKLE LIGMNT,PRIMARY Right 11/10/2017    Procedure: REPAIR, PRIMARY, DISRUPTED LIGAMENT, ANKLE; COLLATERAL;  Surgeon: Fonda Rosalynn Cooler, MD;  Location: [facility] OR [facility];  Service: Ortho Foot & Ankle    PR UPPER GI ENDOSCOPY,BIOPSY N/A 12/20/2019    Procedure: UGI ENDOSCOPY; WITH BIOPSY, SINGLE OR MULTIPLE;  Surgeon: Burnard Almarie Budd, MD;  Location: [facility];  Service: Gastroenterology    SI joint surgery  2004    Bilateral     SPINAL FUSION      TONSILLECTOMY      TUBAL LIGATION         Social History     Socioeconomic History    Marital status: Widowed     Spouse name: None    Number of children: None    Years of education: None    Highest education level: None   Tobacco Use    Smoking status: Never    Smokeless tobacco: Never   Vaping Use    Vaping status: Never Used   Substance and Sexual Activity    Alcohol  use: Not Currently     Alcohol /week: 0.0 standard drinks of alcohol      Comment: rare    Drug use: Never    Sexual activity: Yes     Partners: Male     Birth control/protection: Bilateral Tubal Ligation   Other Topics Concern    Do you use sunscreen? No Tanning bed use? Yes    Are you easily burned? No    Excessive sun exposure? No    Blistering sunburns? No   Social History Narrative    Widowed, 07/05/2014    Has 2 children- ages 56, 26.    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                             Social Drivers of Psychologist, Prison And Probation Services Strain: Low Risk  (04/22/2022)    Overall Financial Resource Strain (CARDIA)     Difficulty of Paying Living Expenses: Not very hard   Food Insecurity: No Food Insecurity (07/08/2023)    Hunger Vital Sign     Worried About Running Out of Food in the Last Year: Never true     Ran Out of Food in the Last Year: Never true   Transportation Needs: No Transportation Needs (07/08/2023)    PRAPARE - Transportation     Lack of Transportation (Medical): No     Lack of Transportation (Non-Medical): No   Stress: No Stress Concern Present (07/05/2019)    [employer] of Occupational Health - Occupational Stress Questionnaire     Feeling of Stress : Only a little   Housing: Low Risk  (07/08/2023)    Housing     Within the past 12 months, have you ever stayed: outside,  in a car, in a tent, in an overnight shelter, or temporarily in someone else's home (i.e. couch-surfing)?: No     Are you worried about losing your housing?: No       Family History   Problem Relation Age of Onset    Pulmonary embolism Mother     Heart attack Mother     Stroke Mother     Scoliosis Mother     Asthma Mother     Early death Mother     Clotting disorder Mother     Thrombophilia Mother         mother had bruise on leg clot floatedd to lungs an    Allergies Father     Gout Father     Osteoporosis Father     Arthritis Father     Angina Father         Back Issues    Cancer Sister         cervical cancer    No Known Problems Daughter     No Known Problems Daughter     Diabetes Paternal Grandfather     Diabetes Paternal Grandmother     Diabetes Sister     Thyroid disease Sister Cancer Sister         Lung and GI tract    Clotting disorder Sister     Osteoporosis Sister     Scoliosis Sister     Early death Sister     Diabetes Maternal Grandfather     Asthma Daughter     Diabetes Daughter     Clotting disorder Daughter     Thyroid disease Daughter     Asthma Daughter     Asthma Daughter     Diabetes Daughter     Clotting disorder Daughter     Thyroid disease Daughter     Asthma Daughter     Early death Sister     Scoliosis Sister     Osteoporosis Brother     Anesthesia problems Neg Hx     Bleeding Disorder Neg Hx     Melanoma Neg Hx     Basal cell carcinoma Neg Hx     Squamous cell carcinoma Neg Hx     Breast cancer Neg Hx     Glaucoma Neg Hx     Macular degeneration Neg Hx        For any of the above entries which indicate no records on file, the patient reports no relevant history.                 Review of Systems:   Review of Systems was completed through a 10 organ system review and is listed in the chart.  Pertinent positives are noted in HPI or flowsheet and otherwise negative.  Patient has been instructed to followup with PCP or appropriate specialist for symptoms outside the purview of this speciality.    OBJECTIVE:     Vitals:     LMP 05/03/2017     The above medications, allergies, history, and ROS, and vitals have been reviewed.    Physical Exam:   GEN: alert and oriented, no apparent distress  HEENT: normocephalic, atraumatic, anicteric, moist mucous membranes  CV: normal heart rate  PULM: normal work of breathing  GI: nondistended  EXT: no swelling, edema in b/l UE and LE  SKIN: no visible ecchymosis or breakdown  PSYCH: normal mood and affect    NEURO:     Manual Muscle Testing  MSK:      Sacroiliac Joint:   Inspection: Normal alignment. No erythema, discoloration, or asymmetry.  Palpation: TTP SI bilaterally and right piriformis. Non tender to palpation over GT bilaterally  Patricks: - (positive 7/10) LLE, - (positive 7/10) RLE  Active Assisted Straight Leg Raise: - (positive) LLE, - (positive) RLE  Distraction- - (positive) LLE, - (positive) RLE      ----------------------------------------------------------------------------------------------------------------------  January 12, 2024 9:42 AM. Documentation assistance provided by [name], medical scribe, at the direction of Nancyann, Luetta Charleston, MD.  ----------------------------------------------------------------------------------------------------------------------    Gwendlyn KATHEE Pouch, DO PGY-2  Physical Medicine and Rehabilitation

## 2024-01-13 DIAGNOSIS — M533 Sacrococcygeal disorders, not elsewhere classified: Principal | ICD-10-CM

## 2024-01-13 DIAGNOSIS — G8929 Other chronic pain: Principal | ICD-10-CM

## 2024-01-13 MED ADMIN — lidocaine (PF) (XYLOCAINE-MPF) 10 mg/mL (1 %) injection 5 mL: 5 mL | @ 13:00:00 | Stop: 2024-01-13

## 2024-01-13 MED ADMIN — dexAMETHasone sodium phos (PF) injection 10 mg: 10 mg | INTRA_ARTICULAR | @ 13:00:00 | Stop: 2024-01-13

## 2024-01-13 NOTE — Patient Instructions (Addendum)
 Thanks so much for coming to see Dr. Nancyann today. It was a pleasure to meet you. This summary reviews the goals and plans we discussed at your visit today.     Below, you will see: a) your working diagnosis b) your treatment plan and c) your next steps and followup plan.    We care about your quality of life and are committed to helping optimize your functionality.     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     --Right cubital tunnel syndrome and right carpal tunnel syndrome, improving     -- right cervical radicular symptoms     -- thoracic axial pain        TREATMENT PLAN:   Performed bilateral SI joint steroid injections today. See full procedure note below.     Discussed with patient today that steroid injections can cause increases in blood sugar levels especially in relation to the timing of her knee joint injections     Provided an external referral to physical therapy today. Recommend trying dry needling to help with pain relief. This can be taken to a location convenient for you,     NEXT STEPS/FOLLOW UP: 4 months for bilateral SI joint/ligament steroid injection

## 2024-01-16 ENCOUNTER — Ambulatory Visit
Admit: 2024-01-16 | Discharge: 2024-01-17 | Payer: Medicaid (Managed Care) | Attending: Student in an Organized Health Care Education/Training Program | Primary: Student in an Organized Health Care Education/Training Program

## 2024-01-16 DIAGNOSIS — E11A Type 2 diabetes mellitus in remission: Principal | ICD-10-CM

## 2024-01-16 DIAGNOSIS — Z8639 Personal history of other endocrine, nutritional and metabolic disease: Principal | ICD-10-CM

## 2024-01-16 MED ORDER — METFORMIN ER 500 MG TABLET,EXTENDED RELEASE 24 HR
ORAL_TABLET | Freq: Every day | ORAL | 5 refills | 60.00000 days | Status: CP
Start: 2024-01-16 — End: ?
  Filled 2024-04-09: qty 120, 60d supply, fill #0

## 2024-01-16 MED ORDER — MOUNJARO 7.5 MG/0.5 ML SUBCUTANEOUS PEN INJECTOR
SUBCUTANEOUS | 9 refills | 0.00000 days | Status: CP
Start: 2024-01-16 — End: ?
  Filled 2024-01-24: qty 2, 28d supply, fill #0

## 2024-01-16 MED FILL — PREMARIN 0.3 MG TABLET: ORAL | 90 days supply | Qty: 90 | Fill #0

## 2024-01-16 NOTE — Progress Notes (Cosign Needed)
 Patient forgot to bring in meter.    BG done today.  A1C done today.  PP: 9 am  BG: 97 mg/dL

## 2024-01-16 NOTE — Progress Notes (Signed)
 [facility]  [address]  [place] [address]  Phone: [phone]  Fax: [phone]    [facility] Management Clinic    Referring Provider:  Luetta [facility]*     PATIENT ID:  Darlene [facility] is a 53 y.o. female with obesity, BMI is Body mass index is 27.4 kg/m??..     Initial [facility] evaluation: December 2020     ASSESSMENT AND PLAN:   # Obesity without  h/o bariatric surgery complicated by Back pain due to multiple spinal fusions, history of pulmonary embolism, hypothyroidism, asthma, hyperlipidemia, arthritis of right knee, history of recurrent kidney stones, dysglycemia.    Treatment Course:  Weight at presentation to [name]: 273lbs (BMI 52)  Current weight: 145lbs   Percent weight loss with Semaglutide /Tirzepatide :  >40%     Diabetes with hyperglycemia has normalized with weight loss  Hypertension has resolved with weight loss.    Weight Management Plan:     Lab evaluation today:  UTD July 2025     Bariatric Surgery: Patient doesn't meet criteria for bariatric surgery with BMI 40 or more or BMI 35 or more with Co-morbidity (T2DM, OSA, HTN)    Diet Recommendations: Add more fruits and vegetables to meals, Plan ahead for healthy meals, and Reduce/eliminate processed snacks  - Applauded efforts to date and discussed strategies for maintaining adequate lean protein sources.   - Discussed importance of adequate hydration.      Physical Activity / Exercise: adequate PA for age/risk factors,     - Applauded efforts to date with frequent walking and stationary bike use.  - Encouraged engagement in resistance training to maintain weight/muscle mass, patient engaging in PT.     Sleep:    - No acute concerns.      Stress management: Stress level is currently feels that it is manageable   - No acute concerns.        Weight gain causing medications:  N/A      Anti Obesity Medications:   Patient has had significant response to incretin therapy, remains with continued health improvements including prior cessation of anti-hypertensive treatment.   - Continue Tirzepatide  at 7.5mg  weekly for continued maintenance therapy.   - OK to continue Metformin  at 1g daily for now. Will consider further decrease pending weight trends.    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  female referred for medical weight management with obesity, initial BMI >50 complicated by back pain due to multiple spinal fusions, history of pulmonary embolism, hypothyroidism, asthma, hyperlipidemia, arthritis of right knee, history of recurrent kidney stones.     Interval history:   - Overall feeling well since prior visit without acute complaint. Medical history and medications currently stable.   - Mounjaro  was reduced at prior visit, has overall been maintaining weight with dose reduction.   - Denies GI side effect with use.   - Staying  active overall despite chronic joint pain. Following with PMR.   - Has been focusing on whole foods, growing vegetables at home.     Dietary History  - Using lean protein sources with most meals.   - Recently introduced chickpea pasta.    Beverages: Water only. Occasional unsweet cherry juice.  Occasional soda once per week.     Physical Activity:  - Walks 1 mile on track per day and rides stationary bike.   - Historically limited by orthopedic injuries, previously in PT.     Sleep:  - No acute issues.    Stress:  - Lost husband in recent years.  - No acute stressors.     Treatment course:   Comorbid conditions include: back pain due to multiple spinal fusions, history of pulmonary embolism, hypothyroidism, asthma, hyperlipidemia, arthritis of right knee, history of recurrent kidney stones, dysglycemia.    Diabetes diagnosed by prior providers with fasting glucose >126 on multiple occasions.      Patients description of the chronology of weight gain:   - Normal weight early in life.   - Weight gain following hystrectomy in 2016.   - Weight gain following broken ankle in August 2019.     Weight History:  Starting weight at presentation: 273lbs  (BMI 52)   Current weight: 145lbs   Percent weight loss with Semaglutide /Tirzepatide :  >40%     Previous and current use of anti-obesity medications:  Metformin :   yes, current  Phentermine: no  Topiramate:  yes, previously  Possible tachycardia     Qysmia:      no  Bupropion:  no  Naltrexone:   no     Contrave:    no  Setmelanotide: no      (McImvree)  Liraglutide :  yes, previously      (Victoza , Saxenda )  Semaglutide :  yes, previously      (Ozempic , Wegovy )  Tirzepatide :  yes, currently, tolerating well.      (Mounjaro , Zepbound)  SGLT-2 inhibitor: no     (Farxiga, Invokana, Jardiance...)  Orlistat  :no     (Xenical , Alli )  Other agents:  None known       Relevant History:  Migraines/HA: no  Palpitations:  no  Hypertension:  no  Previously stopped losartan .   Glaucoma:  no  Kidney Stones:yes,    Seizures:   no  Gallbladder Disease: no     Gallbladder Surgery:  no  Pancreatitis: no     Gallstone Pancreatitis: no  Personal or family history of Medullary Thyroid Cancer: no    The history section was last reviewed by Darlene Garrison SAILOR, MA on Jan 16, 2024.        Current Outpatient Medications   Medication Sig Dispense Refill    aspirin  81 MG chewable tablet Chew 1 tablet (81 mg total) daily. 30 tablet 0    blood sugar diagnostic (ACCU-CHEK GUIDE TEST STRIPS) Strp Use to check blood sugar once daily as directed 100 strip 3    calcium  citrate-vitamin D3 (CITRACAL + D MAXIMUM) 315 mg-6.25 mcg (250 unit) per tablet Take 1 tablet by mouth Two (2) times a day. 60 tablet 1    celecoxib  (CELEBREX ) 200 MG capsule Take 1 capsule (200 mg total) by mouth two (2) times a day as needed for pain. 180 capsule 3    chlorhexidine (PERIDEX) 0.12 % solution RINSE WITH CAPFUL ONCE DAILY THEN SPIT. DO NOT RINSE, EAT, OR DRINK 30 MINUTES AFTER USE      conjugated  estrogens  (PREMARIN ) 0.625 mg/gram vaginal cream Insert 0.5 g into the vagina daily. 30 g 11    dexAMETHasone  (DECADRON ) 0.1 % ophthalmic solution Administer 3 drops into both ears two (2) times a day as needed (otalgai). 5 mL 3    diclofenac  sodium 1.5 % Drop Apply 40 drops topically four (4) times a day as needed for pain 150 mL 2    estrogens , conjugated, (PREMARIN ) 0.3 MG tablet Take 1 tablet (0.3 mg total) by mouth daily. 90 tablet 1    finasteride  (PROPECIA ) 1 mg tablet Take 1 tablet (1 mg total) by mouth daily. 90 tablet 3    fluticasone  propionate (FLONASE ) 50 mcg/actuation nasal spray Use 1 spray in each nostril once daily. 16 g 3    hydrocortisone  2.5 % ointment Apply to affected area on ears twice daily until improved or for up to 2 weeks, whichever is sooner. Break for 1-2 weeks. Restart as needed. 30 g 1    ketoconazole  (NIZORAL ) 2 % cream Apply 1 Application topically two (2) times a day. 30 g 11    lancets (ACCU-CHEK SOFTCLIX LANCETS) Misc Use to check blood sugar once daily as directed 100 each 3    levothyroxine  (SYNTHROID ) 50 MCG tablet Take 1 tablet (50 mcg total) by mouth daily. 90 tablet 3    metFORMIN  (GLUCOPHAGE -XR) 500 MG 24 hr tablet Take 2 tablets (1,000 mg total) by mouth daily with evening meal. 120 tablet 5    minoxidil  (LONITEN ) 2.5 MG tablet Take 1/2 tablet (1.25 mg total) by mouth daily. 45 tablet 3    montelukast  (SINGULAIR ) 10 mg tablet Take 1 tablet (10 mg total) by mouth daily. 90 tablet 3    polymyxin B  sulf-trimethoprim  (POLYTRIM ) 10,000 unit- 1 mg/mL Drop Administer 1 drop to both eyes every six (6) hours. 10 mL 0    pravastatin  (PRAVACHOL ) 40 MG tablet Take 1 tablet (40 mg total) by mouth nightly. 90 tablet 3 spironolactone  (ALDACTONE ) 100 MG tablet Take 1 tablet (100 mg total) by mouth two (2) times a day. 180 tablet 3    tirzepatide  (MOUNJARO ) 7.5 mg/0.5 mL PnIj Inject 7.5 mg subcutaneously every 7 days 2 mL 5    traMADol  (ULTRAM ) 50 mg tablet Take 1 tablet (50 mg total) by mouth every six (6) hours as needed for pain. 40 tablet 0    tretinoin  (RETIN-A ) 0.05 % cream Apply a pea sized amount at night 2-3 times a week x 2 weeks, then every other night for 2 weeks, then nightly. If getting dry, use a facial moisturizer in the morning such as Oil of Olay complete sensitive skin or CereVe AM 45 g 4    VENTOLIN  HFA 90 mcg/actuation inhaler Inhale 2 puffs every six (6) hours as needed for wheezing. 54 g 1     No current facility-administered medications for this visit.        Allergies   Allergen Reactions    Iohexol Anaphylaxis      Desc: IVP DYE   OK WITH 13 HR PREP    Propoxyphene Hives     Uncoded Allergy. Allergen: SHELLFISH    Shellfish Containing Products Anaphylaxis     Patient is ok with topical betadine.    Sulfa (Sulfonamide Antibiotics) Hives    Topiramate Other (See Comments)     Other Reaction: OTHER REACTION  Other Reaction: OTHER REACTION  Increase in HR    Amoxicillin  Rash     ROS:  12 point ROS negative other than specified  above.           Objective     OBJECTIVE:    Vital Signs:  BP 106/68 (BP Site: L Arm, BP Position: Sitting)  - Pulse 84  - Ht 154.9 cm (5' 1)  - Wt 65.8 kg (145 lb)  - LMP 05/03/2017  - BMI 27.40 kg/m??    Wt Readings from Last 5 Encounters:   01/16/24 65.8 kg (145 lb)   12/21/23 62.1 kg (137 lb)   10/13/23 62.5 kg (137 lb 11.2 oz)   10/07/23 60.8 kg (134 lb)   08/05/23 (P) 60.8 kg (134 lb)     Physical Exam:  General appearance - pleasant, conversant, NAD; voice and speech normal  Neck - supple on gross visualization, rest of neck exam limited by Video Visit  Eyes -  No obvious stare or lid edema  Chest - nL apparent WOB  Neurological - no visible tremors  Skin - no visible facial rashes    DATA REVIEW:    Imaging:  N/A      Lab work: reviewed in [facility] EPIC and is noted    Hemoglobin A1C   Date Value Ref Range Status   10/13/2023 5.1 4.8 - 5.6 % Final   08/23/2022 4.6 (L) 4.8 - 5.6 % Final   04/22/2022 4.6 (L) 4.8 - 5.6 % Final   05/16/2014 5.3 4.8 - 6.0 % Final     TSH   Date Value Ref Range Status   10/13/2023 1.579 0.550 - 4.780 uIU/mL Final     ALT   Date Value Ref Range Status   10/13/2023 18 10 - 49 U/L Final   02/25/2023 32 10 - 49 U/L Final   08/23/2022 15 10 - 49 U/L Final     AST   Date Value Ref Range Status   10/13/2023 19 <=34 U/L Final   02/25/2023 24 <=34 U/L Final   08/23/2022 17 <=34 U/L Final     Creatinine   Date Value Ref Range Status   10/13/2023 0.87 0.55 - 1.02 mg/dL Final   87/93/7975 9.14 0.55 - 1.02 mg/dL Final   93/96/7975 9.19 0.55 - 1.02 mg/dL Final   97/74/7983 9.13 0.60 - 1.00 mg/dL Final   .

## 2024-01-17 NOTE — Addendum Note (Signed)
 Addended by: NANCYANN SHOW on: 01/17/2024 07:05 PM     Modules accepted: Level of Service

## 2024-02-20 DIAGNOSIS — L219 Seborrheic dermatitis, unspecified: Principal | ICD-10-CM

## 2024-02-20 MED ORDER — HYDROCORTISONE 2.5 % TOPICAL OINTMENT
TOPICAL | 1 refills | 0.00000 days | Status: CP
Start: 2024-02-20 — End: ?
  Filled 2024-02-23: qty 56.7, 30d supply, fill #0

## 2024-02-23 MED FILL — MOUNJARO 7.5 MG/0.5 ML SUBCUTANEOUS PEN INJECTOR: SUBCUTANEOUS | 28 days supply | Qty: 2 | Fill #1

## 2024-02-23 MED FILL — KETOCONAZOLE 2 % TOPICAL CREAM: TOPICAL | 30 days supply | Qty: 30 | Fill #1

## 2024-02-23 MED FILL — FLUTICASONE PROPIONATE 50 MCG/ACTUATION NASAL SPRAY,SUSPENSION: NASAL | 60 days supply | Qty: 16 | Fill #1

## 2024-02-23 MED FILL — TRETINOIN 0.05 % TOPICAL CREAM: TOPICAL | 30 days supply | Qty: 45 | Fill #1

## 2024-02-23 MED FILL — VENTOLIN HFA 90 MCG/ACTUATION AEROSOL INHALER: RESPIRATORY_TRACT | 75 days supply | Qty: 54 | Fill #1

## 2024-03-08 NOTE — Progress Notes (Signed)
 Assessment/Plan:    # Type 2 DM without retinopathy both eyes    - diabetic since [address]  -Hgb A1C: 4.9 (01/16/24)  -Stressed compliance with meds/blood sugar control and follow-up w/ PCP as scheduled.  -RTC in 1 year for follow-up or sooner if changes in vision develop.    # Glaucoma evaluation: both eyes  -- Age: 53  -- Race: White  -- Family history: none  -- Trauma: no  -- Additional risks:  -- Medical/Medications:   -- Treatment history:       - Glaucoma rx:   -- Color plates:   -- TMax: 18/17  -- IOP: 18/16  -- CCT: 604/602  -- Gonio: (03/20/24): SS visible x 360 OU  -- Optic Nerves: right eye:  0.6/0.6     left eye: 0.665/0.65   -- OCT RNFL: 03/20/24      -Right eye: RNFL AT: 78 (nml) mild ST thinning ; DA: 2.26; GPA: initial; GCIPL: diffuse thinning (Poor SS 4/10)     -Left eye: RNFL AT: 82 (nml) mild IT/ST thinning  ; DA: 2.25; GPA : initial; GCIPL: intact       --Disc symmetry: 80 % (nml)  -- HVF: 03/20/24     -Right eye: GHT: Outside normal limits; VFI: 91%; MD: -5.43; Pattern: nonspecific. 11%FP     -Left eye: GHT: Outside normal limits; VFI: 91%; MD: -5.60; Pattern: nonspecific defects.  -- Disc Heme History: negative  -- Impression:  --Lengthy discussion w/ patient regarding findings/diagnosis and need for observation or treatment and compliance with clinical follow-up as appropriate.  --Patient understands therapeutic goals and shares in the commitment to reach goals.    - 03/20/24: Glaucoma suspect OU        -For: C/Ds, age, OCT thinning (Poor SS right eye)        -Against: neg fam hx, thick CCT.  --PLAN:     --monitor  without treatment.    -- RTC: RTC 1 year for TA, HVF, OCT/RNFL.      # Cataract both eyes  -mild  -Discussed options including observation, new corrective lenses, or surgery with Phaco with IOL.  -Pt. satisfied with vision and does not desire sx at this time.   -monitor  -RTC PRN or sooner if vision changes.     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  -Observe  - RTC: PRN or sooner if changes in vision develop.     # Dry Eye Syndrome: both eyes  # Blepharitis  --Plan:     --use AT as needed.    --decrese Cator oil lid scrubs to daily or 3-4x/week    --RTC as scheduled or sooner if symptoms do not improve or if they worsen.     # Questionable amblyopia OU  - per patient  -baseline VA: right eye:20/20-; left eye: 20/25   - observe

## 2024-03-20 DIAGNOSIS — H52203 Unspecified astigmatism, bilateral: Principal | ICD-10-CM

## 2024-03-20 DIAGNOSIS — H2513 Age-related nuclear cataract, bilateral: Principal | ICD-10-CM

## 2024-03-20 DIAGNOSIS — H524 Presbyopia: Principal | ICD-10-CM

## 2024-03-20 DIAGNOSIS — H40003 Preglaucoma, unspecified, bilateral: Principal | ICD-10-CM

## 2024-03-20 DIAGNOSIS — E119 Type 2 diabetes mellitus without complications: Principal | ICD-10-CM

## 2024-03-20 DIAGNOSIS — H04123 Dry eye syndrome of bilateral lacrimal glands: Principal | ICD-10-CM

## 2024-03-20 DIAGNOSIS — H0100B Unspecified blepharitis left eye, upper and lower eyelids: Principal | ICD-10-CM

## 2024-03-27 ENCOUNTER — Ambulatory Visit: Admit: 2024-03-27 | Discharge: 2024-03-28 | Payer: Medicaid (Managed Care) | Attending: Family | Primary: Family

## 2024-03-27 MED ADMIN — triamcinolone acetonide (KENALOG-40) injection 40 mg: 40 mg | INTRA_ARTICULAR | @ 20:00:00 | Stop: 2024-03-27

## 2024-03-27 NOTE — Patient Instructions (Signed)
 Thank you for choosing [facility]!  We appreciate the opportunity to participate in your care.      If any questions or concerns arise after your visit, please do not hesitant to contact me by [facility] or by calling our clinical team at [phone].    Please let me know if I can be of assistance with this or other orthopaedic issues in the future.        -frequent stretching (multiple times every day)  -roll foot over frozen water bottle or tennis ball  -Voltaren  gel (over the counter topical anti-inflammatory)  -night splint      -heel cups    -supportive shoes every step of the day  -consider Hokas               Plantar Fasciitis: Exercises  Introduction  Here are some examples of exercises for you to try. The exercises may be suggested for a condition or for rehabilitation. Start each exercise slowly. Ease off the exercises if you start to have pain.  You will be told when to start these exercises and which ones will work best for you.  How to do the exercises  Towel stretch    Sit with your legs extended and knees straight.  Place a towel around your foot just under the toes.  Hold each end of the towel in each hand, with your hands above your knees.  Pull back with the towel so that your foot stretches toward you.  Hold the position for at least 15 to 30 seconds.  Repeat 2 to 4 times a session, up to 5 sessions a day.  Calf stretch    This exercise stretches the muscles at the back of the lower leg (the calf) and the Achilles tendon. Do this exercise 3 or 4 times a day, 5 days a week.  Stand facing a wall with your hands on the wall at about eye level. Put the leg you want to stretch about a step behind your other leg.  Keeping your back heel on the floor, bend your front knee until you feel a stretch in the back leg.  Hold the stretch for 15 to 30 seconds. Repeat 2 to 4 times.  Plantar fascia and calf stretch    Stretching the plantar fascia and calf muscles can increase flexibility and decrease heel pain. You can do this exercise several times each day and before and after activity.  Stand on a step as shown above. Be sure to hold on to the banister.  Slowly let your heels down over the edge of the step as you relax your calf muscles. You should feel a gentle stretch across the bottom of your foot and up the back of your leg to your knee.  Hold the stretch about 15 to 30 seconds, and then tighten your calf muscle a little to bring your heel back up to the level of the step. Repeat 2 to 4 times.  Towel curls    Make this exercise more challenging by placing a weighted object, such as a soup can, on the other end of the towel.  While sitting, place your foot on a towel on the floor and scrunch the towel toward you with your toes.  Then, also using your toes, push the towel away from you.  Marble pickups    Put marbles on the floor next to a cup.  Using your toes, try to lift the marbles up from the floor and  put them in the cup.  Follow-up care is a key part of your treatment and safety. Be sure to make and go to all appointments, and call your doctor if you are having problems. It's also a good idea to know your test results and keep a list of the medicines you take.  Current as of: January 28, 2021               Content Version: 13.6  ?? 2006-2023 [employer].   Care instructions adapted under license by [facility]. If you have questions about a medical condition or this instruction, always ask your healthcare professional. [employer] disclaims any warranty or liability for your use of this information.

## 2024-03-27 NOTE — Progress Notes (Signed)
 Encounter Provider: Delon Donal Hoover, NP  Date of Service: 03/27/2024 Last encounter Orthopaedics: 12/20/2023   Last encounter this provider: 12/20/2023      Notes:     Primary Care Provider: Lora Alfonso HERO, FNP  Referring Provider: Alfonso HERO Lora    ICD-10-CM   1. Arthritis of right subtalar joint  M19.071   2. Plantar fasciitis  M72.2    Orthopaedic notes: - s/p right fibula shaft & syndesmosis ORIF and deltoid suture anchor repair on 11/10/17 (Attending: [facility])  - s/p right ankle arthroscopic debridement and removal of broken syndesmotic screw on 08/30/2018 (Attending: [facility])    Physical Function CAT Score: (not recorded)  Pain Interference CAT Score: (not recorded)  Depression CAT Score: (not recorded)  Sleep CAT Score: (not recorded)  [email or web address]     Darlene Lucas is a 54 y.o. female     ASSESSMENT & PLAN       Assessment/Plan:     Right subtalar joint osteoarthritis    - Administered subtalar steroid injection.  - Scheduled follow-up in three months.  - Discussed surgical intervention as a future option; she declined.    Plantar fasciitis    - Referred to physical therapy. Patient requests dry needling.  - Encouraged continuation of home exercise program.          Risks of procedure were discussed.  The patient verbalized understanding and provided consent.  A timeout was performed prior to procedure identifying the correct patient, location, medication.  4 ml lidocaine  and 40 mg kenalog  were injected into the R subtalar joint using aseptic technique.  The patient tolerated this well.  Advised to avoid vigorous activities for the next few days and then the patient may proceed with activity as tolerated.   Requested Prescriptions      No prescriptions requested or ordered in this encounter      Orders Placed This Encounter   Procedures    Ambulatory referral to Physical Therapy History:  Reason for visit: foot & ankle pain    HPI:  History of Present Illness    Darlene Lucas is a 54 year old female with right subtalar joint osteoarthritis and plantar fasciitis who presents for follow-up of chronic right foot and ankle pain.    She has chronic right subtalar joint osteoarthritis with persistent pain, swelling, and tightness in the right foot and ankle.  She remains able to perform functional movements including stair climbing, toe lifts, and deep hamstring stretches, but continues to report tightness and discomfort. Her last steroid injection on 12/20/2023 provided symptomatic relief.    She describes intermittent plantar heel symptoms characterized by a crunching and popping sensation, occasionally associated with pain and difficulty bearing weight. Soreness is localized to the plantar aspect of the heel. She performs self-directed therapy including towel crunches, tendon band exercises, and use of an OTP strap. She applies castor oil and performs massage, suspecting adhesions at a scar site. She expresses interest in dry needling for symptomatic relief. Not interested in discussing surgical options.             Medical History Past Medical History[1]   Surgical History Past Surgical History[2]   Allergies Iohexol, Propoxyphene, Shellfish containing products, Sulfa (sulfonamide antibiotics), Topiramate, and Amoxicillin    Medications She has a current medication list which includes the following prescription(s): aspirin , accu-chek guide test strips, calcium  citrate-vitamin d3, celecoxib , chlorhexidine, premarin , dexamethasone , diclofenac  sodium, estrogens  (conjugated), finasteride , fluticasone  propionate, hydrocortisone , ketoconazole , lancets, levothyroxine , metformin , minoxidil , montelukast ,  polymyxin b  sulf-trimethoprim , pravastatin , spironolactone , mounjaro , tramadol , tretinoin , and ventolin  hfa.   Family History Her family history includes Allergies in her father; Angina in her father; Arthritis in her father; Asthma in her daughter, daughter, daughter, daughter, and mother; Cancer in her sister and sister; Clotting disorder in her daughter, daughter, mother, and sister; Diabetes in her daughter, daughter, maternal grandfather, paternal grandfather, paternal grandmother, and sister; Early death in her mother, sister, and sister; Gout in her father; Heart attack in her mother; No Known Problems in her daughter and daughter; Osteoporosis in her brother, father, and sister; Pulmonary embolism in her mother; Scoliosis in her mother, sister, and sister; Stroke in her mother; Thrombophilia in her mother; Thyroid disease in her daughter, daughter, and sister.   Social History She reports that she has never smoked. She has never used smokeless tobacco. She reports that she does not currently use alcohol . She reports that she does not use drugs.Home address:[address] [place] [address] [place] [address]  Occupation:         Occupational History    Not on file     Social History     Social History Narrative    Widowed, 07/05/2014    Has 2 children- ages 28, 57.    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                                    Exam:  The primary encounter diagnosis was Arthritis of right subtalar joint. A diagnosis of Plantar fasciitis was also pertinent to this visit.   Estimated body mass index is 27.4 kg/m?? as calculated from the following:    Height as of 01/16/24: 154.9 cm (5' 1).    Weight as of 01/16/24: 65.8 kg (145 lb).        Physical Exam   Physical Exam    CARDIOVASCULAR: DP pulse 2+.  EXTREMITIES: Tenderness over the medial calcaneal tuberosity. No pain with calcaneal squeeze.  MUSCULOSKELETAL: Able to dorsiflex to neutral and plantar flex to 40 degrees. Subtalar motion intact with discomfort. No ligamentous laxity at the ankle.  SKIN: Skin warm, mild perfusion, brisk capillary refill. Test Results  The primary encounter diagnosis was Arthritis of right subtalar joint. A diagnosis of Plantar fasciitis was also pertinent to this visit.  Lab Results   Component Value Date    A1C 4.9 01/16/2024       No results found for: VITD    Imaging  Orders Placed This Encounter   Procedures    Ambulatory referral to Physical Therapy         DME ORDER:  Dx:  ,                 [1]   Past Medical History:  Diagnosis Date    Abnormal Pap smear of cervix     Abnormal uterine bleeding     Was told possible adenomyosis, possible need for hysterectomy by Dr. Rojelio. Had US , and trial of 90 days of provera. 11/29/13 TVUS 5x6x4cm RO 2x1.5x1.5 LO 2x2x 1.6, EMS 5mm, abundant flow near endometrium c/w possible adenomyosis     Allergic Age 28    Amblyopia 1974    Anemia     Ankylosing spondylitis of site in spine    (CMS-HCC) 2005  Arthritis     Asthma (HHS-HCC)     Blepharitis November 2021    BMI 50.0-59.9, adult (CMS-HCC)     Bursitis 2007    Multiple areas    Chronic back pain     Chronic kidney disease     Constipation     CTS (carpal tunnel syndrome)     Deep vein thrombosis    (CMS-HCC)     Diabetes mellitus (CMS-HCC) 2020    Difficult intravenous access     Dry eyes     Dysplasia of cervix, high grade CIN 2     Edema     Elevated LFTs     Environmental allergies     Eye trauma 2020    fractured orbit. Unsure which eye.    Gait abnormality 2003    Uses of a cane as needed    GERD (gastroesophageal reflux disease)     Goiter 2015    monitoring levels    Gout 2019    Headache     Heart murmur 2018    Hyperlipidemia     Hypertension 2021    Hypokalemia     Kidney stone started 1986    [id number]    Osteoarthritis 2003    Osteoporosis     Prediabetes     Pulmonary embolism (CMS-HCC) 1989, 1992    following miscarriages     Recurrent UTI     Right leg pain 10/2017    right fibula fracture    Scoliosis 1974    Spondylolisthesis     Thyroid function study abnormality 2015    seeing endocrine (TSH 5.37 12/25/13, T4 1.02   [2]   Past Surgical History:  Procedure Laterality Date    ANKLE FRACTURE SURGERY  2019    BACK SURGERY      CARPAL TUNNEL RELEASE      ENDOMETRIAL BIOPSY      11/2013    FOOT SURGERY  2019    FRACTURE SURGERY  2007/2019    HYSTERECTOMY      Knee arthroscopy Left 1987    KNEE ARTHROSCOPY      LAMINECTOMY      LUMBAR FUSION      L5 - S1 - multiple - also had hardware removed    LUMBAR FUSION  2008    L1-S1    PR ANKLE SCOPE,EXTENS DEBRIDEMNT Right 08/30/2018    Procedure: ARTHROSCOPY ANK SURG; DEBRID EXTEN - modifier 22;  Surgeon: Fonda Rosalynn Cooler, MD;  Location: [facility] OR [facility];  Service: Orthopedics    PR ARTHROCENTESIS ASPIR&/INJ MAJOR JT/BURSA W/O US  Right 08/30/2018    Procedure: ARTHROCENTESIS, ASPIRATION AND/OR INJECTION; MAJOR JOINT OR BURSA (E.G.SHOULDER/HIP/KNEE);  Surgeon: Fonda Rosalynn Cooler, MD;  Location: [facility] OR [facility];  Service: Orthopedics    PR BIOPSY OF VAGINA,EXTENSIVE N/A 09/20/2014    Procedure: BIOPSY VAGINAL MUCOSA; EXTENSIVE, REQUIRING SUTURE (INCLUDING CYSTS);  Surgeon: Tinnie Cash, MD;  [facility];  Service: Advanced Laparoscopy    PR BIOPSY/EXCISION, LYMPH NODE(S) N/A 09/20/2014    Procedure: BX/EXC LYMPH NODE; OPEN, SUPERF (SEPART PROC);  Surgeon: Tinnie Cash, MD;  Location: [facility] OR [facility];  Service: Advanced Laparoscopy    PR CLOSED TREATMENT PST MALLEOLUS FRACTURE W/O MANIP Right 11/10/2017    Procedure: CLOSED TREATMENT OF POSTERIOR MALLEOLUS FRACTURE; WITHOUT MANIPULATION;  Surgeon: Fonda Rosalynn Cooler, MD;  Location: [facility] OR [facility];  Service: Ortho Foot & Ankle    PR COLPOSCOPY,ENTIRE VAGINA,W/BIOPSY(S) N/A 09/20/2014    Procedure: COLPOSCOPY  OF THE ENTIRE VAGINA, WITH CERVIX IF PRESENT; WITH BIOPSY(S) OF VAGINA/CERVIX;  Surgeon: Tinnie Cash, MD;  Location: [facility] OR [facility];  Service: Advanced Laparoscopy    PR COLSC FLX W/RMVL OF TUMOR POLYP LESION SNARE TQ N/A 12/20/2019    Procedure: COLONOSCOPY FLEX; W/REMOV TUMOR/LES BY SNARE;  Surgeon: Burnard Almarie Budd, MD;  Location: [facility]  Service: Gastroenterology    PR CYSTOURETHROSCOPY N/A 06/13/2014    Procedure: CYSTOURETHROSCOPY (SEPARATE PROCEDURE);  Surgeon: Tinnie Cash, MD;  Location[facility] OR [facility];  Service: Advanced Laparoscopy    PR KNEE SCOPE,MED OR LAT MENIS REPAIR Right 09/15/2021    Procedure: ARTHROSCOPY, KNEE, SURGICAL; WITH MENISCUS REPAIR (MEDIAL OR LATERAL);  Surgeon: Lamar Marsa Hamming, MD;  Location: OR [facility];  Service: Orthopedics    PR LAP,VAG HYST,UTERUS 250GMS/<,SALP-OOPH N/A 06/13/2014    Procedure: ROBOTIC LAPAROSCOPY, SURGICAL, W/VAGINAL HYSTERECTOMY, UTERUS 250 GRAMS OR LESS; W/REMOVAL OF TUBE(S) &/OR OVARY(S);  Surgeon: Tinnie Cash, MD;  Location: [facility] OR [facility];  Service: Advanced Laparoscopy    PR OPEN TREATMENT PROXIMAL FIBULA/SHAFT FRACTURE Right 11/10/2017    Procedure: OPEN TREATMENT OF PROXIMAL FIBULA OR SHAFT FRACTURE, INCLUDES INTERNAL FIXATION, WHEN PERFORMED - Modifier 22;  Surgeon: Fonda Rosalynn Cooler, MD;  Location: [facility] OR [facility];  Service: Ortho Foot & Ankle    PR OPEN TX DISTAL TIBIOFIBULAR JOINT DISRUPTION Right 11/10/2017    Procedure: OPEN TREATMENT OF DISTAL TIBIOFIBULAR JOINT (SYNDESMOSIS) DISRUPTION, INCLUDES INTERNAL FIXATION, IF DONE;  Surgeon: Fonda Rosalynn Cooler, MD;  Location: [facility] OR [facility];  Service: Ortho Foot & Ankle    PR PELVIC EXAMINATION W ANESTH N/A 09/20/2014    Procedure: PELVIC EXAMINATION UNDER ANESTHESIA (OTHER THAN LOCAL);  Surgeon: Tinnie Cash, MD;  Location: [facility] OR [facility];  Service: Advanced Laparoscopy    PR REMOVAL IMPLANT DEEP Right 08/30/2018    Procedure: R20--REMOVE IMPLANT; DEEP--RIGHT ANKLE x 2 - modifier 22;  Surgeon: Fonda Rosalynn Cooler, MD;  Location: [facility];  Service: Orthopedics    PR REPAIR 1 COLLAT ANKLE LIGMNT,PRIMARY Right 11/10/2017    Procedure: REPAIR, PRIMARY, DISRUPTED LIGAMENT, ANKLE; COLLATERAL;  Surgeon: Fonda Rosalynn Cooler, MD;  Location: [facility] OR [facility];  Service: Ortho Foot & Ankle    PR UPPER GI ENDOSCOPY,BIOPSY N/A 12/20/2019    Procedure: UGI ENDOSCOPY; WITH BIOPSY, SINGLE OR MULTIPLE;  Surgeon: Burnard Almarie Budd, MD;  Location: GI PROC[facility]URES [facility];  Service: Gastroenterology    SI joint surgery  2004    Bilateral     SPINAL FUSION      TONSILLECTOMY      TUBAL LIGATION

## 2024-03-28 DIAGNOSIS — L219 Seborrheic dermatitis, unspecified: Principal | ICD-10-CM

## 2024-03-28 MED ORDER — HYDROCORTISONE 2.5 % TOPICAL OINTMENT
TOPICAL | 1 refills | 0.00000 days | Status: CP
Start: 2024-03-28 — End: ?
  Filled 2024-03-29: qty 28.35, 30d supply, fill #0

## 2024-03-29 MED FILL — KETOCONAZOLE 2 % TOPICAL CREAM: TOPICAL | 30 days supply | Qty: 30 | Fill #2

## 2024-03-29 MED FILL — MOUNJARO 7.5 MG/0.5 ML SUBCUTANEOUS PEN INJECTOR: SUBCUTANEOUS | 28 days supply | Qty: 2 | Fill #2

## 2024-03-29 MED FILL — FINASTERIDE 1 MG TABLET: ORAL | 90 days supply | Qty: 90 | Fill #1

## 2024-03-29 MED FILL — MINOXIDIL 2.5 MG TABLET: ORAL | 90 days supply | Qty: 45 | Fill #1

## 2024-03-29 MED FILL — TRETINOIN 0.05 % TOPICAL CREAM: TOPICAL | 30 days supply | Qty: 45 | Fill #2

## 2024-03-29 MED FILL — PREMARIN 0.625 MG/GRAM VAGINAL CREAM: VAGINAL | 60 days supply | Qty: 30 | Fill #1

## 2024-04-05 DIAGNOSIS — H01009 Unspecified blepharitis unspecified eye, unspecified eyelid: Principal | ICD-10-CM

## 2024-04-05 MED ORDER — POLYMYXIN B SULFATE 10,000 UNIT-TRIMETHOPRIM 1 MG/ML EYE DROPS
Freq: Four times a day (QID) | OPHTHALMIC | 0 refills | 50.00000 days
Start: 2024-04-05 — End: ?

## 2024-04-09 MED FILL — CELECOXIB 200 MG CAPSULE: ORAL | 90 days supply | Qty: 180 | Fill #3

## 2024-04-09 MED FILL — SYNTHROID 50 MCG TABLET: ORAL | 90 days supply | Qty: 90 | Fill #1

## 2024-04-09 MED FILL — PREMARIN 0.3 MG TABLET: ORAL | 90 days supply | Qty: 90 | Fill #1

## 2024-04-09 MED FILL — MONTELUKAST 10 MG TABLET: ORAL | 90 days supply | Qty: 90 | Fill #2

## 2024-04-09 MED FILL — SPIRONOLACTONE 100 MG TABLET: ORAL | 90 days supply | Qty: 180 | Fill #1

## 2024-04-11 ENCOUNTER — Ambulatory Visit: Admit: 2024-04-11 | Discharge: 2024-04-12 | Payer: Medicaid (Managed Care) | Attending: Family | Primary: Family

## 2024-04-11 NOTE — Patient Instructions (Signed)
 Plan:You received a corticosteroid injection to reduce pain and inflammation.  Please note that it can take up to 2 weeks for this injection to fully work.  While many people will feel relief sooner, please be patient.      The injection contained a corticosteroid and a numbing agent.  The numbing agent can last for 1-6 hours.  After this wears off you may have increased pain until the steroid has a chance to work.    What are some of the possible side effects of a steroid injection?    Common side effects:  temporarily elevated blood sugar (in diabetic patients) that can last a few days   flushing of the skin, especially the face  temporary rise in blood pressure  discoloration or atrophy of the skin at the injection site    Call your doctor at once if you have:  persistent worsening pain or swelling, fever;  blurred vision, tunnel vision, eye pain, or seeing halos around lights;  fast or slow heartbeats;  increased blood pressure that is associated with severe headache, blurred vision, pounding in your neck or ears, anxiety, nosebleed;  headaches, ringing in your ears, dizziness, nausea, vision problems, pain behind your eyes    This is not a complete list of side effects and others may occur. Call your provider for medical advice about side effects.     What other drugs may be affected after the injection?  Many drugs can interact with steroids. Not all possible interactions are listed here. Tell your doctor about all your current medicines and any you start or stop using, especially:  an antibiotic or antifungal medication;  birth control pills or hormone replacement therapy;  a blood thinner (warfarin, Coumadin, and others);  a diuretic or water pill;  insulin or oral diabetes medicine;  medicine to treat tuberculosis;  a nonsteroidal anti-inflammatory drug or NSAID (aspirin, ibuprofen, naproxen, diclofenac, indomethacin, Advil, Aleve, Celebrex, and many others); or  seizure medication.      You can resume your normal daily activities, but consider resting the injected area for the next few days.          Thank you for coming to [facility] and our clinic today!     We aim to provide you with the highest quality, individualized care.  If you have any unanswered questions after the visit, please do not hesitate to reach out to [place] on MyChart or leave a message for the nurse.  ?  MyChart messages: These messages can be sent to your provider and will be checked by their clinical support staff.? The messages are checked throughout the day during normal business hours from 8:30 am-4:00 pm Monday-Friday, however responses may take up to 48 hours.? Please use this method of communication for non-urgent and non-emergent concerns, questions, refill requests or inquiries only.? ?Our team will help respond to all of your questions.? Please note that you may be asked to see a provider by either a telehealth or in person visit if it is deemed your questions are best handled in the clinic setting in person.??  ?  Please keep in mind, these messages are not real time communications, so be patient when waiting for a response.    If you do not have access to MyChart, do not know how to use MyChart or have an issue that may require more extensive discussion, please call the nurses' call line: [phone].? This line is checked throughout the day and will be  responded to as time allows.? Please note that return calls could take up to 48 hours, depending on the nature of the need.?  ?  If you have an issue that requires emergent attention that cannot wait; either call the Orthopaedics resident on call at [phone], consider coming to our [facility] walk-in clinic, or go to the nearest Emergency Department.    If you need to schedule future appointments, please call [phone].     We look forward to seeing you again in the future and appreciate you choosing [facility] for your care!    Thank you,                We provide innovative and comprehensive patient centered care that is supported by evidence-based research                                                                                                    RESEARCH PARTICIPATION    Please check out our current research studies to see if you or someone you know may qualify at:    [email or web address]

## 2024-04-12 NOTE — Progress Notes (Signed)
 SPORTS MEDICINE RETURN VISIT    ASSESSMENT AND PLAN      Diagnosis ICD-10-CM Associated Orders   1. Primary osteoarthritis of both knees  M17.0 Lg Joint Inj: bilateral knee                 Unfortunately, her insurance does not cover viscosupplementation injection and no operative intervention is recommended.  She will proceed with intra-articular steroid injection today.    Return in about 4 months (around 08/09/2024).    Procedure(s):  B knee Intra-articular steroid injections      SUBJECTIVE     Chief Complaint:   No chief complaint on file.      History of Present Illness: 54 y.o. female who presents for bilateral knee pain, requesting intra-articular steroid injections.  Last Steroid injections in knees were 12/28/23.   No interval injury.    Past Medical History:   Past Medical History:   Diagnosis Date    Abnormal Pap smear of cervix     Abnormal uterine bleeding     Was told possible adenomyosis, possible need for hysterectomy by Dr. Rojelio. Had US , and trial of 90 days of provera. 11/29/13 TVUS 5x6x4cm RO 2x1.5x1.5 LO 2x2x 1.6, EMS 5mm, abundant flow near endometrium c/w possible adenomyosis     Allergic Age 83    Amblyopia 1974    Anemia     Ankylosing spondylitis of site in spine    (CMS-HCC) 2005    Arthritis     Asthma (HHS-HCC)     Blepharitis November 2021    BMI 50.0-59.9, adult (CMS-HCC)     Bursitis 2007    Multiple areas    Chronic back pain     Chronic kidney disease     Constipation     CTS (carpal tunnel syndrome)     Deep vein thrombosis    (CMS-HCC)     Diabetes mellitus (CMS-HCC) 2020    Difficult intravenous access     Dry eyes     Dysplasia of cervix, high grade CIN 2     Edema     Elevated LFTs     Environmental allergies     Eye trauma 2020    fractured orbit. Unsure which eye.    Gait abnormality 2003    Uses of a cane as needed    GERD (gastroesophageal reflux disease)     Goiter 2015    monitoring levels    Gout 2019    Headache     Heart murmur 2018    Hyperlipidemia     Hypertension 2021 Hypokalemia     Kidney stone started 1986    [id number]    Osteoarthritis 2003    Osteoporosis     Prediabetes     Pulmonary embolism (CMS-HCC) 1989, 1992    following miscarriages     Recurrent UTI     Right leg pain 10/2017    right fibula fracture    Scoliosis 1974    Spondylolisthesis     Thyroid function study abnormality 2015    seeing endocrine (TSH 5.37 12/25/13, T4 1.02         OBJECTIVE     Physical Exam:  Vitals:   Wt Readings from Last 3 Encounters:   01/16/24 65.8 kg (145 lb)   12/21/23 62.1 kg (137 lb)   10/13/23 62.5 kg (137 lb 11.2 oz)     Estimated body mass index is 27.4 kg/m?? as calculated from the following:    Height  as of 01/16/24: 154.9 cm (5' 1).    Weight as of 01/16/24: 65.8 kg (145 lb).  Gen: Well-appearing female in no acute distress  MSK: Gait is steady.  No obvious effusion bilateral knees.   No pain throughout arc of range of motion.  Skin is warm, dry and intact without excessive warmth, erythema or ecchymosis.    Imaging/other tests: No new x-rays taken today.        ADMINISTRATIVE     I have personally reviewed and interpreted the images (as available).  Point-of-care ultrasound imaging is on file and stored in a permanent location (if performed).  I have personally reviewed prior records and incorporated relevant information above (as available).    @SMIBILLING @    PROCEDURES     Lg Joint Inj: bilateral knee on 04/11/2024 9:00 AM  Indications: pain  Details: 22 G needle, anterolateral approach  Laterality: bilateral  Location: knee  Medications (Right): 20 mg triamcinolone  acetonide 10 mg/mL  Medications (Left): 20 mg triamcinolone  acetonide 10 mg/mL  Outcome: tolerated well, no immediate complications  Procedure, treatment alternatives, risks and benefits explained, specific risks discussed. Consent was given by the patient. Immediately prior to procedure a time out was called to verify the correct patient, procedure, equipment, support staff and site/side marked as required. Patient was prepped and draped in the usual sterile fashion.     Medical Care Team Attestation: All ProcDoc orders were read back and verbally confirmed with the procedure provider, including but not limited to patient name, medication name, dose, and route, before any actions were taken.  Provider Attestation: The information documented by members of my medical care team was reviewed and verified for accuracy by me.           DME     DME ORDER:  Dx:  ,
